# Patient Record
Sex: Female | Born: 1940 | Race: White | Hispanic: No | Smoking: Current every day smoker
Health system: Southern US, Community
[De-identification: ages and names within clinical notes are randomized; demographics above are authoritative.]

## PROBLEM LIST (undated history)

## (undated) ENCOUNTER — Emergency Department (HOSPITAL_COMMUNITY): Discharge status: Absent without leave | Payer: Medicare Other

## (undated) DIAGNOSIS — R51 Headache: Secondary | ICD-10-CM

## (undated) DIAGNOSIS — M549 Dorsalgia, unspecified: Secondary | ICD-10-CM

## (undated) DIAGNOSIS — K219 Gastro-esophageal reflux disease without esophagitis: Secondary | ICD-10-CM

## (undated) DIAGNOSIS — E119 Type 2 diabetes mellitus without complications: Secondary | ICD-10-CM

## (undated) DIAGNOSIS — R200 Anesthesia of skin: Secondary | ICD-10-CM

## (undated) DIAGNOSIS — R3915 Urgency of urination: Secondary | ICD-10-CM

## (undated) DIAGNOSIS — F419 Anxiety disorder, unspecified: Secondary | ICD-10-CM

## (undated) DIAGNOSIS — F329 Major depressive disorder, single episode, unspecified: Secondary | ICD-10-CM

## (undated) DIAGNOSIS — I10 Essential (primary) hypertension: Secondary | ICD-10-CM

## (undated) DIAGNOSIS — J45909 Unspecified asthma, uncomplicated: Secondary | ICD-10-CM

## (undated) DIAGNOSIS — Z8709 Personal history of other diseases of the respiratory system: Secondary | ICD-10-CM

## (undated) DIAGNOSIS — R609 Edema, unspecified: Secondary | ICD-10-CM

## (undated) DIAGNOSIS — Z8719 Personal history of other diseases of the digestive system: Secondary | ICD-10-CM

## (undated) DIAGNOSIS — Z8711 Personal history of peptic ulcer disease: Secondary | ICD-10-CM

## (undated) DIAGNOSIS — R600 Localized edema: Secondary | ICD-10-CM

## (undated) DIAGNOSIS — F32A Depression, unspecified: Secondary | ICD-10-CM

## (undated) DIAGNOSIS — K56609 Unspecified intestinal obstruction, unspecified as to partial versus complete obstruction: Secondary | ICD-10-CM

## (undated) DIAGNOSIS — J189 Pneumonia, unspecified organism: Secondary | ICD-10-CM

## (undated) DIAGNOSIS — Z87442 Personal history of urinary calculi: Secondary | ICD-10-CM

## (undated) DIAGNOSIS — R0602 Shortness of breath: Secondary | ICD-10-CM

## (undated) DIAGNOSIS — M2550 Pain in unspecified joint: Secondary | ICD-10-CM

## (undated) DIAGNOSIS — R350 Frequency of micturition: Secondary | ICD-10-CM

## (undated) DIAGNOSIS — G5600 Carpal tunnel syndrome, unspecified upper limb: Secondary | ICD-10-CM

## (undated) DIAGNOSIS — M1990 Unspecified osteoarthritis, unspecified site: Secondary | ICD-10-CM

## (undated) DIAGNOSIS — G8929 Other chronic pain: Secondary | ICD-10-CM

## (undated) DIAGNOSIS — J449 Chronic obstructive pulmonary disease, unspecified: Secondary | ICD-10-CM

## (undated) HISTORY — PX: ESOPHAGOGASTRODUODENOSCOPY: SHX1529

## (undated) HISTORY — PX: EPIDURAL BLOCK INJECTION: SHX1516

## (undated) HISTORY — PX: OTHER SURGICAL HISTORY: SHX169

## (undated) HISTORY — PX: CHOLECYSTECTOMY: SHX55

## (undated) HISTORY — PX: ABDOMINAL HYSTERECTOMY: SHX81

## (undated) HISTORY — PX: EYE SURGERY: SHX253

## (undated) HISTORY — PX: SHOULDER SURGERY: SHX246

## (undated) HISTORY — PX: SPINE SURGERY: SHX786

## (undated) HISTORY — DX: Unspecified asthma, uncomplicated: J45.909

## (undated) HISTORY — PX: JOINT REPLACEMENT: SHX530

---

## 1999-03-22 ENCOUNTER — Encounter: Payer: Self-pay | Admitting: Emergency Medicine

## 1999-03-22 ENCOUNTER — Emergency Department (HOSPITAL_COMMUNITY): Admission: EM | Admit: 1999-03-22 | Discharge: 1999-03-22 | Payer: Self-pay | Admitting: Emergency Medicine

## 1999-06-11 ENCOUNTER — Encounter: Payer: Self-pay | Admitting: Orthopedic Surgery

## 1999-06-15 ENCOUNTER — Inpatient Hospital Stay (HOSPITAL_COMMUNITY): Admission: RE | Admit: 1999-06-15 | Discharge: 1999-06-17 | Payer: Self-pay | Admitting: Orthopedic Surgery

## 1999-06-17 ENCOUNTER — Inpatient Hospital Stay (HOSPITAL_COMMUNITY)
Admission: RE | Admit: 1999-06-17 | Discharge: 1999-06-25 | Payer: Self-pay | Admitting: Physical Medicine & Rehabilitation

## 2000-05-03 ENCOUNTER — Ambulatory Visit (HOSPITAL_COMMUNITY): Admission: RE | Admit: 2000-05-03 | Discharge: 2000-05-03 | Payer: Self-pay | Admitting: Orthopedic Surgery

## 2000-05-03 ENCOUNTER — Encounter: Payer: Self-pay | Admitting: Orthopedic Surgery

## 2000-05-17 ENCOUNTER — Encounter: Payer: Self-pay | Admitting: Orthopedic Surgery

## 2000-05-17 ENCOUNTER — Ambulatory Visit (HOSPITAL_COMMUNITY): Admission: RE | Admit: 2000-05-17 | Discharge: 2000-05-17 | Payer: Self-pay | Admitting: Orthopedic Surgery

## 2000-12-07 ENCOUNTER — Encounter (HOSPITAL_COMMUNITY): Admission: RE | Admit: 2000-12-07 | Discharge: 2001-01-06 | Payer: Self-pay | Admitting: Sports Medicine

## 2000-12-10 ENCOUNTER — Encounter: Payer: Self-pay | Admitting: *Deleted

## 2000-12-10 ENCOUNTER — Emergency Department (HOSPITAL_COMMUNITY): Admission: EM | Admit: 2000-12-10 | Discharge: 2000-12-10 | Payer: Self-pay | Admitting: *Deleted

## 2000-12-24 ENCOUNTER — Encounter: Payer: Self-pay | Admitting: *Deleted

## 2000-12-24 ENCOUNTER — Emergency Department (HOSPITAL_COMMUNITY): Admission: EM | Admit: 2000-12-24 | Discharge: 2000-12-24 | Payer: Self-pay | Admitting: *Deleted

## 2001-01-19 ENCOUNTER — Encounter: Payer: Self-pay | Admitting: Sports Medicine

## 2001-01-19 ENCOUNTER — Encounter: Admission: RE | Admit: 2001-01-19 | Discharge: 2001-01-19 | Payer: Self-pay | Admitting: Sports Medicine

## 2001-02-02 ENCOUNTER — Encounter: Payer: Self-pay | Admitting: Sports Medicine

## 2001-02-02 ENCOUNTER — Encounter: Admission: RE | Admit: 2001-02-02 | Discharge: 2001-02-02 | Payer: Self-pay | Admitting: Sports Medicine

## 2001-09-27 ENCOUNTER — Encounter: Payer: Self-pay | Admitting: Sports Medicine

## 2001-09-27 ENCOUNTER — Encounter: Admission: RE | Admit: 2001-09-27 | Discharge: 2001-09-27 | Payer: Self-pay | Admitting: Sports Medicine

## 2003-03-05 ENCOUNTER — Ambulatory Visit (HOSPITAL_COMMUNITY): Admission: RE | Admit: 2003-03-05 | Discharge: 2003-03-05 | Payer: Self-pay | Admitting: Family Medicine

## 2003-03-05 ENCOUNTER — Encounter: Payer: Self-pay | Admitting: Family Medicine

## 2003-07-04 ENCOUNTER — Ambulatory Visit (HOSPITAL_COMMUNITY): Admission: RE | Admit: 2003-07-04 | Discharge: 2003-07-04 | Payer: Self-pay | Admitting: Family Medicine

## 2003-07-15 ENCOUNTER — Other Ambulatory Visit: Admission: RE | Admit: 2003-07-15 | Discharge: 2003-07-15 | Payer: Self-pay | Admitting: *Deleted

## 2003-07-22 ENCOUNTER — Ambulatory Visit (HOSPITAL_COMMUNITY): Admission: RE | Admit: 2003-07-22 | Discharge: 2003-07-22 | Payer: Self-pay | Admitting: Family Medicine

## 2003-08-09 ENCOUNTER — Ambulatory Visit (HOSPITAL_COMMUNITY): Admission: RE | Admit: 2003-08-09 | Discharge: 2003-08-09 | Payer: Self-pay | Admitting: Urology

## 2003-09-09 ENCOUNTER — Observation Stay (HOSPITAL_COMMUNITY): Admission: RE | Admit: 2003-09-09 | Discharge: 2003-09-10 | Payer: Self-pay | Admitting: General Surgery

## 2003-11-12 ENCOUNTER — Ambulatory Visit (HOSPITAL_COMMUNITY): Admission: RE | Admit: 2003-11-12 | Discharge: 2003-11-12 | Payer: Self-pay | Admitting: *Deleted

## 2004-01-29 ENCOUNTER — Encounter (INDEPENDENT_AMBULATORY_CARE_PROVIDER_SITE_OTHER): Payer: Self-pay | Admitting: Specialist

## 2004-01-29 ENCOUNTER — Inpatient Hospital Stay (HOSPITAL_COMMUNITY): Admission: RE | Admit: 2004-01-29 | Discharge: 2004-01-31 | Payer: Self-pay | Admitting: *Deleted

## 2004-01-29 ENCOUNTER — Encounter (INDEPENDENT_AMBULATORY_CARE_PROVIDER_SITE_OTHER): Payer: Self-pay | Admitting: *Deleted

## 2004-03-17 ENCOUNTER — Ambulatory Visit (HOSPITAL_COMMUNITY): Admission: RE | Admit: 2004-03-17 | Discharge: 2004-03-17 | Payer: Self-pay | Admitting: Ophthalmology

## 2004-08-13 ENCOUNTER — Ambulatory Visit (HOSPITAL_COMMUNITY): Admission: RE | Admit: 2004-08-13 | Discharge: 2004-08-13 | Payer: Self-pay | Admitting: Family Medicine

## 2004-12-10 ENCOUNTER — Ambulatory Visit: Payer: Self-pay | Admitting: Orthopedic Surgery

## 2005-01-06 ENCOUNTER — Ambulatory Visit: Admission: RE | Admit: 2005-01-06 | Discharge: 2005-01-06 | Payer: Self-pay | Admitting: Family Medicine

## 2005-03-09 ENCOUNTER — Ambulatory Visit (HOSPITAL_COMMUNITY): Admission: RE | Admit: 2005-03-09 | Discharge: 2005-03-09 | Payer: Self-pay | Admitting: Family Medicine

## 2005-05-04 ENCOUNTER — Emergency Department (HOSPITAL_COMMUNITY): Admission: EM | Admit: 2005-05-04 | Discharge: 2005-05-04 | Payer: Self-pay | Admitting: Emergency Medicine

## 2005-09-20 ENCOUNTER — Ambulatory Visit (HOSPITAL_COMMUNITY): Admission: RE | Admit: 2005-09-20 | Discharge: 2005-09-20 | Payer: Self-pay | Admitting: Family Medicine

## 2006-10-06 ENCOUNTER — Emergency Department (HOSPITAL_COMMUNITY): Admission: EM | Admit: 2006-10-06 | Discharge: 2006-10-06 | Payer: Self-pay | Admitting: Emergency Medicine

## 2007-01-31 ENCOUNTER — Ambulatory Visit (HOSPITAL_COMMUNITY): Admission: RE | Admit: 2007-01-31 | Discharge: 2007-01-31 | Payer: Self-pay | Admitting: Family Medicine

## 2007-04-27 ENCOUNTER — Encounter (HOSPITAL_COMMUNITY): Admission: RE | Admit: 2007-04-27 | Discharge: 2007-05-27 | Payer: Self-pay | Admitting: Unknown Physician Specialty

## 2007-10-03 ENCOUNTER — Ambulatory Visit (HOSPITAL_COMMUNITY): Admission: RE | Admit: 2007-10-03 | Discharge: 2007-10-03 | Payer: Self-pay | Admitting: Internal Medicine

## 2007-11-01 ENCOUNTER — Emergency Department (HOSPITAL_COMMUNITY): Admission: EM | Admit: 2007-11-01 | Discharge: 2007-11-01 | Payer: Self-pay | Admitting: Emergency Medicine

## 2007-11-17 ENCOUNTER — Ambulatory Visit (HOSPITAL_COMMUNITY): Admission: RE | Admit: 2007-11-17 | Discharge: 2007-11-17 | Payer: Self-pay | Admitting: Family Medicine

## 2007-12-05 ENCOUNTER — Ambulatory Visit (HOSPITAL_COMMUNITY): Admission: RE | Admit: 2007-12-05 | Discharge: 2007-12-05 | Payer: Self-pay | Admitting: Family Medicine

## 2008-01-08 ENCOUNTER — Ambulatory Visit (HOSPITAL_COMMUNITY): Admission: RE | Admit: 2008-01-08 | Discharge: 2008-01-08 | Payer: Self-pay | Admitting: Family Medicine

## 2008-01-08 ENCOUNTER — Encounter (INDEPENDENT_AMBULATORY_CARE_PROVIDER_SITE_OTHER): Payer: Self-pay | Admitting: Diagnostic Radiology

## 2008-01-19 ENCOUNTER — Ambulatory Visit: Payer: Self-pay | Admitting: Internal Medicine

## 2008-01-23 ENCOUNTER — Ambulatory Visit (HOSPITAL_COMMUNITY): Admission: RE | Admit: 2008-01-23 | Discharge: 2008-01-23 | Payer: Self-pay | Admitting: Internal Medicine

## 2008-01-29 ENCOUNTER — Ambulatory Visit (HOSPITAL_COMMUNITY): Admission: RE | Admit: 2008-01-29 | Discharge: 2008-01-29 | Payer: Self-pay | Admitting: Gastroenterology

## 2008-02-03 ENCOUNTER — Emergency Department (HOSPITAL_COMMUNITY): Admission: EM | Admit: 2008-02-03 | Discharge: 2008-02-03 | Payer: Self-pay | Admitting: Emergency Medicine

## 2008-02-08 ENCOUNTER — Ambulatory Visit: Payer: Self-pay | Admitting: Internal Medicine

## 2008-02-14 ENCOUNTER — Encounter: Admission: RE | Admit: 2008-02-14 | Discharge: 2008-02-14 | Payer: Self-pay | Admitting: Internal Medicine

## 2008-02-16 ENCOUNTER — Ambulatory Visit: Payer: Self-pay | Admitting: Internal Medicine

## 2008-02-16 DIAGNOSIS — I10 Essential (primary) hypertension: Secondary | ICD-10-CM | POA: Insufficient documentation

## 2008-02-16 DIAGNOSIS — F411 Generalized anxiety disorder: Secondary | ICD-10-CM

## 2008-02-16 DIAGNOSIS — J42 Unspecified chronic bronchitis: Secondary | ICD-10-CM

## 2008-02-16 DIAGNOSIS — F329 Major depressive disorder, single episode, unspecified: Secondary | ICD-10-CM

## 2008-02-16 DIAGNOSIS — Z8669 Personal history of other diseases of the nervous system and sense organs: Secondary | ICD-10-CM | POA: Insufficient documentation

## 2008-02-16 DIAGNOSIS — E119 Type 2 diabetes mellitus without complications: Secondary | ICD-10-CM | POA: Insufficient documentation

## 2008-02-16 DIAGNOSIS — M545 Low back pain: Secondary | ICD-10-CM

## 2008-02-16 DIAGNOSIS — N318 Other neuromuscular dysfunction of bladder: Secondary | ICD-10-CM

## 2008-02-16 DIAGNOSIS — M129 Arthropathy, unspecified: Secondary | ICD-10-CM | POA: Insufficient documentation

## 2008-02-16 DIAGNOSIS — K219 Gastro-esophageal reflux disease without esophagitis: Secondary | ICD-10-CM | POA: Insufficient documentation

## 2008-02-16 DIAGNOSIS — K279 Peptic ulcer, site unspecified, unspecified as acute or chronic, without hemorrhage or perforation: Secondary | ICD-10-CM | POA: Insufficient documentation

## 2008-02-20 ENCOUNTER — Telehealth (INDEPENDENT_AMBULATORY_CARE_PROVIDER_SITE_OTHER): Payer: Self-pay | Admitting: Internal Medicine

## 2008-02-21 ENCOUNTER — Encounter (INDEPENDENT_AMBULATORY_CARE_PROVIDER_SITE_OTHER): Payer: Self-pay | Admitting: Internal Medicine

## 2008-02-21 ENCOUNTER — Telehealth (INDEPENDENT_AMBULATORY_CARE_PROVIDER_SITE_OTHER): Payer: Self-pay | Admitting: *Deleted

## 2008-02-22 ENCOUNTER — Ambulatory Visit: Payer: Self-pay | Admitting: Internal Medicine

## 2008-02-22 DIAGNOSIS — R112 Nausea with vomiting, unspecified: Secondary | ICD-10-CM | POA: Insufficient documentation

## 2008-02-29 ENCOUNTER — Encounter (INDEPENDENT_AMBULATORY_CARE_PROVIDER_SITE_OTHER): Payer: Self-pay | Admitting: Internal Medicine

## 2008-05-01 ENCOUNTER — Encounter: Admission: RE | Admit: 2008-05-01 | Discharge: 2008-05-01 | Payer: Self-pay | Admitting: Sports Medicine

## 2008-06-28 ENCOUNTER — Ambulatory Visit (HOSPITAL_COMMUNITY): Admission: RE | Admit: 2008-06-28 | Discharge: 2008-06-29 | Payer: Self-pay | Admitting: Orthopedic Surgery

## 2008-08-08 ENCOUNTER — Encounter: Admission: RE | Admit: 2008-08-08 | Discharge: 2008-08-08 | Payer: Self-pay | Admitting: Orthopedic Surgery

## 2008-09-09 ENCOUNTER — Encounter (INDEPENDENT_AMBULATORY_CARE_PROVIDER_SITE_OTHER): Payer: Self-pay | Admitting: Internal Medicine

## 2008-09-25 ENCOUNTER — Encounter (HOSPITAL_COMMUNITY): Admission: RE | Admit: 2008-09-25 | Discharge: 2008-10-25 | Payer: Self-pay | Admitting: Orthopedic Surgery

## 2008-10-15 ENCOUNTER — Encounter (INDEPENDENT_AMBULATORY_CARE_PROVIDER_SITE_OTHER): Payer: Self-pay | Admitting: Internal Medicine

## 2008-10-29 ENCOUNTER — Encounter (HOSPITAL_COMMUNITY): Admission: RE | Admit: 2008-10-29 | Discharge: 2008-11-28 | Payer: Self-pay | Admitting: Orthopedic Surgery

## 2008-11-15 ENCOUNTER — Emergency Department (HOSPITAL_COMMUNITY): Admission: EM | Admit: 2008-11-15 | Discharge: 2008-11-15 | Payer: Self-pay | Admitting: Emergency Medicine

## 2009-06-26 ENCOUNTER — Encounter (HOSPITAL_COMMUNITY): Admission: RE | Admit: 2009-06-26 | Discharge: 2009-07-25 | Payer: Self-pay | Admitting: Orthopedic Surgery

## 2009-07-29 ENCOUNTER — Encounter (HOSPITAL_COMMUNITY): Admission: RE | Admit: 2009-07-29 | Discharge: 2009-08-28 | Payer: Self-pay | Admitting: Orthopedic Surgery

## 2009-08-21 ENCOUNTER — Encounter: Admission: RE | Admit: 2009-08-21 | Discharge: 2009-08-21 | Payer: Self-pay | Admitting: Sports Medicine

## 2009-09-23 ENCOUNTER — Ambulatory Visit (HOSPITAL_COMMUNITY): Admission: RE | Admit: 2009-09-23 | Discharge: 2009-09-24 | Payer: Self-pay | Admitting: Orthopedic Surgery

## 2009-10-14 ENCOUNTER — Inpatient Hospital Stay (HOSPITAL_COMMUNITY): Admission: AD | Admit: 2009-10-14 | Discharge: 2009-10-17 | Payer: Self-pay | Admitting: Orthopedic Surgery

## 2009-10-15 ENCOUNTER — Ambulatory Visit: Payer: Self-pay | Admitting: Internal Medicine

## 2009-10-24 ENCOUNTER — Emergency Department (HOSPITAL_COMMUNITY): Admission: EM | Admit: 2009-10-24 | Discharge: 2009-10-24 | Payer: Self-pay | Admitting: Emergency Medicine

## 2009-10-28 ENCOUNTER — Ambulatory Visit (HOSPITAL_COMMUNITY): Payer: Self-pay | Admitting: Internal Medicine

## 2009-10-28 ENCOUNTER — Encounter (HOSPITAL_COMMUNITY): Admission: RE | Admit: 2009-10-28 | Discharge: 2009-11-27 | Payer: Self-pay | Admitting: Internal Medicine

## 2009-11-07 ENCOUNTER — Emergency Department (HOSPITAL_COMMUNITY): Admission: EM | Admit: 2009-11-07 | Discharge: 2009-11-07 | Payer: Self-pay | Admitting: Emergency Medicine

## 2009-11-19 ENCOUNTER — Emergency Department (HOSPITAL_COMMUNITY): Admission: EM | Admit: 2009-11-19 | Discharge: 2009-11-19 | Payer: Self-pay | Admitting: Emergency Medicine

## 2010-02-16 ENCOUNTER — Emergency Department (HOSPITAL_COMMUNITY): Admission: EM | Admit: 2010-02-16 | Discharge: 2010-02-16 | Payer: Self-pay | Admitting: Emergency Medicine

## 2010-03-01 ENCOUNTER — Inpatient Hospital Stay (HOSPITAL_COMMUNITY): Admission: EM | Admit: 2010-03-01 | Discharge: 2010-03-09 | Payer: Self-pay | Admitting: Emergency Medicine

## 2010-03-09 ENCOUNTER — Inpatient Hospital Stay: Admission: AD | Admit: 2010-03-09 | Discharge: 2010-03-18 | Payer: Self-pay | Admitting: Internal Medicine

## 2010-04-22 ENCOUNTER — Emergency Department (HOSPITAL_COMMUNITY)
Admission: EM | Admit: 2010-04-22 | Discharge: 2010-04-23 | Payer: Self-pay | Source: Home / Self Care | Admitting: Emergency Medicine

## 2010-08-15 ENCOUNTER — Encounter: Payer: Self-pay | Admitting: Family Medicine

## 2010-08-16 ENCOUNTER — Encounter: Payer: Self-pay | Admitting: Internal Medicine

## 2010-08-16 ENCOUNTER — Encounter: Payer: Self-pay | Admitting: Sports Medicine

## 2010-08-16 ENCOUNTER — Encounter: Payer: Self-pay | Admitting: Orthopedic Surgery

## 2010-08-17 ENCOUNTER — Encounter: Payer: Self-pay | Admitting: Internal Medicine

## 2010-10-08 LAB — DIFFERENTIAL
Basophils Relative: 1 % (ref 0–1)
Eosinophils Absolute: 0.4 10*3/uL (ref 0.0–0.7)
Lymphs Abs: 2.6 10*3/uL (ref 0.7–4.0)
Monocytes Absolute: 0.5 10*3/uL (ref 0.1–1.0)
Monocytes Relative: 6 % (ref 3–12)

## 2010-10-08 LAB — BASIC METABOLIC PANEL
CO2: 23 mEq/L (ref 19–32)
CO2: 27 mEq/L (ref 19–32)
Calcium: 9.7 mg/dL (ref 8.4–10.5)
Chloride: 101 mEq/L (ref 96–112)
GFR calc Af Amer: >60 mL/min (ref >60)
GFR calc non Af Amer: >60 mL/min (ref >60)
Glucose, Bld: 101 mg/dL — ABNORMAL HIGH (ref 70–99)
Glucose, Bld: 100 mg/dL — ABNORMAL HIGH (ref 70–99)
Potassium: 2.8 mEq/L — ABNORMAL LOW (ref 3.5–5.1)
Potassium: 3.9 mEq/L (ref 3.5–5.1)
Sodium: 133 mEq/L — ABNORMAL LOW (ref 135–145)
Sodium: 134 mEq/L — ABNORMAL LOW (ref 135–145)

## 2010-10-08 LAB — CBC
HCT: 34.3 % — ABNORMAL LOW (ref 36.0–46.0)
Hemoglobin: 11.7 g/dL — ABNORMAL LOW (ref 12.0–15.0)
MCH: 28.7 pg (ref 26.0–34.0)
MCHC: 34.2 g/dL (ref 30.0–36.0)
RBC: 4.08 MIL/uL (ref 3.87–5.11)

## 2010-10-08 LAB — POCT CARDIAC MARKERS: CKMB, poc: <1 ng/mL — ABNORMAL LOW (ref 1.0–8.0)

## 2010-10-09 LAB — PROTIME-INR
INR: 1.01 (ref 0.00–1.49)
Prothrombin Time: 13.5 seconds (ref 11.6–15.2)

## 2010-10-09 LAB — DIFFERENTIAL
Basophils Absolute: 0 10*3/uL (ref 0.0–0.1)
Basophils Absolute: 0 10*3/uL (ref 0.0–0.1)
Basophils Relative: 1 % (ref 0–1)
Basophils Relative: 1 % (ref 0–1)
Eosinophils Absolute: 0.2 10*3/uL (ref 0.0–0.7)
Eosinophils Absolute: 0.2 10*3/uL (ref 0.0–0.7)
Eosinophils Absolute: 1 10*3/uL — ABNORMAL HIGH (ref 0.0–0.7)
Eosinophils Relative: 2 % (ref 0–5)
Eosinophils Relative: 7 % — ABNORMAL HIGH (ref 0–5)
Lymphocytes Relative: 11 % — ABNORMAL LOW (ref 12–46)
Lymphs Abs: 1.2 10*3/uL (ref 0.7–4.0)
Lymphs Abs: 1.2 10*3/uL (ref 0.7–4.0)
Lymphs Abs: 1.5 10*3/uL (ref 0.7–4.0)
Lymphs Abs: 1.7 10*3/uL (ref 0.7–4.0)
Monocytes Absolute: 0.5 10*3/uL (ref 0.1–1.0)
Monocytes Absolute: 0.6 10*3/uL (ref 0.1–1.0)
Monocytes Absolute: 0.7 10*3/uL (ref 0.1–1.0)
Monocytes Relative: 5 % (ref 3–12)
Monocytes Relative: 6 % (ref 3–12)
Monocytes Relative: 7 % (ref 3–12)
Monocytes Relative: 9 % (ref 3–12)
Neutro Abs: 5.5 10*3/uL (ref 1.7–7.7)
Neutro Abs: 5.9 10*3/uL (ref 1.7–7.7)
Neutrophils Relative %: 62 % (ref 43–77)
Neutrophils Relative %: 69 % (ref 43–77)
Neutrophils Relative %: 72 % (ref 43–77)
Neutrophils Relative %: 81 % — ABNORMAL HIGH (ref 43–77)

## 2010-10-09 LAB — URINALYSIS, ROUTINE W REFLEX MICROSCOPIC
Bilirubin Urine: NEGATIVE
Hgb urine dipstick: NEGATIVE
Ketones, ur: NEGATIVE mg/dL
Nitrite: NEGATIVE
Protein, ur: NEGATIVE mg/dL
Specific Gravity, Urine: 1.025 (ref 1.005–1.030)
Urobilinogen, UA: 0.2 mg/dL (ref 0.0–1.0)

## 2010-10-09 LAB — BLOOD GAS, ARTERIAL
Acid-base deficit: 0.2 mmol/L (ref 0.0–2.0)
Acid-base deficit: 1 mmol/L (ref 0.0–2.0)
Acid-base deficit: 1.8 mmol/L (ref 0.0–2.0)
Bicarbonate: 23.2 mEq/L (ref 20.0–24.0)
Bicarbonate: 24.1 mEq/L — ABNORMAL HIGH (ref 20.0–24.0)
Bicarbonate: 25 mEq/L — ABNORMAL HIGH (ref 20.0–24.0)
Bicarbonate: 28.5 mEq/L — ABNORMAL HIGH (ref 20.0–24.0)
FIO2: 1 %
FIO2: 40 %
FIO2: 40 %
MECHVT: 500 mL
O2 Saturation: 96.7 %
PEEP: 5 cmH2O
Patient temperature: 37
Patient temperature: 37
RATE: 14 resp/min
RATE: 14 resp/min
TCO2: 22.6 mmol/L (ref 0–100)
TCO2: 23.8 mmol/L (ref 0–100)
TCO2: 26.6 mmol/L (ref 0–100)
pCO2 arterial: 31.9 mmHg — ABNORMAL LOW (ref 35.0–45.0)
pCO2 arterial: 41 mmHg (ref 35.0–45.0)
pCO2 arterial: 43.4 mmHg (ref 35.0–45.0)
pH, Arterial: 7.379 (ref 7.350–7.400)
pH, Arterial: 7.456 — ABNORMAL HIGH (ref 7.350–7.400)
pH, Arterial: 7.459 — ABNORMAL HIGH (ref 7.350–7.400)
pO2, Arterial: 152 mmHg — ABNORMAL HIGH (ref 80.0–100.0)
pO2, Arterial: 200 mmHg — ABNORMAL HIGH (ref 80.0–100.0)
pO2, Arterial: 63.4 mmHg — ABNORMAL LOW (ref 80.0–100.0)
pO2, Arterial: 77.9 mmHg — ABNORMAL LOW (ref 80.0–100.0)

## 2010-10-09 LAB — URINE CULTURE
Colony Count: >=100000
Culture  Setup Time: 201108071945

## 2010-10-09 LAB — BASIC METABOLIC PANEL
BUN: 3 mg/dL — ABNORMAL LOW (ref 6–23)
BUN: <1 mg/dL — ABNORMAL LOW (ref 6–23)
CO2: 24 mEq/L (ref 19–32)
CO2: 27 mEq/L (ref 19–32)
Calcium: 9.1 mg/dL (ref 8.4–10.5)
Chloride: 100 mEq/L (ref 96–112)
Chloride: 100 mEq/L (ref 96–112)
Chloride: 101 mEq/L (ref 96–112)
Chloride: 95 mEq/L — ABNORMAL LOW (ref 96–112)
Chloride: 99 mEq/L (ref 96–112)
Creatinine, Ser: 0.41 mg/dL (ref 0.4–1.2)
Creatinine, Ser: 0.44 mg/dL (ref 0.4–1.2)
GFR calc Af Amer: >60 mL/min (ref >60)
GFR calc Af Amer: >60 mL/min (ref >60)
GFR calc Af Amer: >60 mL/min (ref >60)
GFR calc Af Amer: >60 mL/min (ref >60)
GFR calc non Af Amer: >60 mL/min (ref >60)
Glucose, Bld: 102 mg/dL — ABNORMAL HIGH (ref 70–99)
Potassium: 2.5 mEq/L — CL (ref 3.5–5.1)
Potassium: 3.2 mEq/L — ABNORMAL LOW (ref 3.5–5.1)
Potassium: 3.6 mEq/L (ref 3.5–5.1)
Potassium: 3.7 mEq/L (ref 3.5–5.1)
Sodium: 130 mEq/L — ABNORMAL LOW (ref 135–145)
Sodium: 132 mEq/L — ABNORMAL LOW (ref 135–145)
Sodium: 133 mEq/L — ABNORMAL LOW (ref 135–145)
Sodium: 133 mEq/L — ABNORMAL LOW (ref 135–145)

## 2010-10-09 LAB — POCT CARDIAC MARKERS
CKMB, poc: 1.4 ng/mL (ref 1.0–8.0)
Troponin i, poc: <0.05 ng/mL (ref 0.00–0.09)

## 2010-10-09 LAB — TSH: TSH: 1.428 u[IU]/mL (ref 0.350–4.500)

## 2010-10-09 LAB — CBC
HCT: 26.3 % — ABNORMAL LOW (ref 36.0–46.0)
HCT: 27.6 % — ABNORMAL LOW (ref 36.0–46.0)
HCT: 28 % — ABNORMAL LOW (ref 36.0–46.0)
HCT: 28.6 % — ABNORMAL LOW (ref 36.0–46.0)
Hemoglobin: 9.3 g/dL — ABNORMAL LOW (ref 12.0–15.0)
Hemoglobin: 9.4 g/dL — ABNORMAL LOW (ref 12.0–15.0)
Hemoglobin: 9.7 g/dL — ABNORMAL LOW (ref 12.0–15.0)
MCH: 28.4 pg (ref 26.0–34.0)
MCH: 29.2 pg (ref 26.0–34.0)
MCHC: 33.6 g/dL (ref 30.0–36.0)
MCHC: 33.6 g/dL (ref 30.0–36.0)
MCV: 84.4 fL (ref 78.0–100.0)
MCV: 84.9 fL (ref 78.0–100.0)
MCV: 85.1 fL (ref 78.0–100.0)
MCV: 85.7 fL (ref 78.0–100.0)
Platelets: 218 10*3/uL (ref 150–400)
Platelets: 266 10*3/uL (ref 150–400)
Platelets: 280 10*3/uL (ref 150–400)
RBC: 3.1 MIL/uL — ABNORMAL LOW (ref 3.87–5.11)
RBC: 3.18 MIL/uL — ABNORMAL LOW (ref 3.87–5.11)
RBC: 3.32 MIL/uL — ABNORMAL LOW (ref 3.87–5.11)
RDW: 14.9 % (ref 11.5–15.5)
RDW: 15.4 % (ref 11.5–15.5)
RDW: 15.5 % (ref 11.5–15.5)
WBC: 11 10*3/uL — ABNORMAL HIGH (ref 4.0–10.5)
WBC: 8.6 10*3/uL (ref 4.0–10.5)
WBC: 9.9 10*3/uL (ref 4.0–10.5)
WBC: 9.9 10*3/uL (ref 4.0–10.5)

## 2010-10-09 LAB — BRAIN NATRIURETIC PEPTIDE: Pro B Natriuretic peptide (BNP): 125 pg/mL — ABNORMAL HIGH (ref 0.0–100.0)

## 2010-10-09 LAB — CARDIAC PANEL(CRET KIN+CKTOT+MB+TROPI)
CK, MB: 1.8 ng/mL (ref 0.3–4.0)
CK, MB: 2.3 ng/mL (ref 0.3–4.0)
Relative Index: 1 (ref 0.0–2.5)
Total CK: 169 U/L (ref 7–177)
Total CK: 190 U/L — ABNORMAL HIGH (ref 7–177)
Troponin I: 0.02 ng/mL (ref 0.00–0.06)
Troponin I: 0.02 ng/mL (ref 0.00–0.06)

## 2010-10-09 LAB — COMPREHENSIVE METABOLIC PANEL
ALT: 42 U/L — ABNORMAL HIGH (ref 0–35)
Albumin: 3.7 g/dL (ref 3.5–5.2)
Alkaline Phosphatase: 115 U/L (ref 39–117)
Calcium: 9.9 mg/dL (ref 8.4–10.5)
GFR calc Af Amer: >60 mL/min (ref >60)
Glucose, Bld: 133 mg/dL — ABNORMAL HIGH (ref 70–99)
Potassium: 4.8 mEq/L (ref 3.5–5.1)
Sodium: 126 mEq/L — ABNORMAL LOW (ref 135–145)
Total Protein: 6.8 g/dL (ref 6.0–8.3)

## 2010-10-09 LAB — URINE MICROSCOPIC-ADD ON

## 2010-10-09 LAB — RETICULOCYTES: Retic Ct Pct: 2.3 % (ref 0.4–3.1)

## 2010-10-09 LAB — CULTURE, BLOOD (ROUTINE X 2)
Culture: NO GROWTH
Culture: NO GROWTH
Report Status: 8122011
Report Status: 8122011

## 2010-10-09 LAB — VITAMIN B12: Vitamin B-12: >2000 pg/mL — ABNORMAL HIGH (ref 211–911)

## 2010-10-09 LAB — RAPID URINE DRUG SCREEN, HOSP PERFORMED
Barbiturates: NOT DETECTED
Cocaine: NOT DETECTED

## 2010-10-09 LAB — VANCOMYCIN, TROUGH: Vancomycin Tr: 6.9 ug/mL — ABNORMAL LOW (ref 10.0–20.0)

## 2010-10-09 LAB — SAMPLE TO BLOOD BANK

## 2010-10-09 LAB — MRSA PCR SCREENING: MRSA by PCR: NEGATIVE

## 2010-10-09 LAB — IRON AND TIBC
Iron: 21 ug/dL — ABNORMAL LOW (ref 42–135)
Saturation Ratios: 8 % — ABNORMAL LOW (ref 20–55)
TIBC: 252 ug/dL (ref 250–470)

## 2010-10-10 LAB — BASIC METABOLIC PANEL
CO2: 25 mEq/L (ref 19–32)
Chloride: 99 mEq/L (ref 96–112)
GFR calc Af Amer: >60 mL/min (ref >60)
Potassium: 4.1 mEq/L (ref 3.5–5.1)
Sodium: 130 mEq/L — ABNORMAL LOW (ref 135–145)

## 2010-10-10 LAB — CBC
Hemoglobin: 11.8 g/dL — ABNORMAL LOW (ref 12.0–15.0)
MCH: 28.6 pg (ref 26.0–34.0)
MCV: 84.7 fL (ref 78.0–100.0)
RBC: 4.13 MIL/uL (ref 3.87–5.11)

## 2010-10-10 LAB — POCT CARDIAC MARKERS
CKMB, poc: <1 ng/mL — ABNORMAL LOW (ref 1.0–8.0)
CKMB, poc: <1 ng/mL — ABNORMAL LOW (ref 1.0–8.0)
Myoglobin, poc: 69.1 ng/mL (ref 12–200)
Myoglobin, poc: 78.8 ng/mL (ref 12–200)
Troponin i, poc: <0.05 ng/mL (ref 0.00–0.09)

## 2010-10-13 LAB — DIFFERENTIAL
Eosinophils Relative: 9 % — ABNORMAL HIGH (ref 0–5)
Lymphocytes Relative: 15 % (ref 12–46)
Lymphs Abs: 1.3 10*3/uL (ref 0.7–4.0)
Monocytes Absolute: 0.4 10*3/uL (ref 0.1–1.0)
Monocytes Relative: 5 % (ref 3–12)
Neutro Abs: 6.2 10*3/uL (ref 1.7–7.7)

## 2010-10-13 LAB — POCT CARDIAC MARKERS
CKMB, poc: <1 ng/mL — ABNORMAL LOW (ref 1.0–8.0)
Myoglobin, poc: 65.5 ng/mL (ref 12–200)
Myoglobin, poc: 67.9 ng/mL (ref 12–200)
Troponin i, poc: <0.05 ng/mL (ref 0.00–0.09)

## 2010-10-13 LAB — BASIC METABOLIC PANEL
GFR calc Af Amer: >60 mL/min (ref >60)
GFR calc non Af Amer: >60 mL/min (ref >60)
Glucose, Bld: 117 mg/dL — ABNORMAL HIGH (ref 70–99)
Potassium: 3.3 mEq/L — ABNORMAL LOW (ref 3.5–5.1)
Sodium: 133 mEq/L — ABNORMAL LOW (ref 135–145)

## 2010-10-13 LAB — BLOOD GAS, ARTERIAL
Acid-Base Excess: 6.1 mmol/L — ABNORMAL HIGH (ref 0.0–2.0)
Bicarbonate: 30.3 mEq/L — ABNORMAL HIGH (ref 20.0–24.0)
O2 Saturation: 99.2 %
pCO2 arterial: 46 mmHg — ABNORMAL HIGH (ref 35.0–45.0)
pO2, Arterial: 156 mmHg — ABNORMAL HIGH (ref 80.0–100.0)

## 2010-10-13 LAB — CBC
HCT: 30.2 % — ABNORMAL LOW (ref 36.0–46.0)
Hemoglobin: 10.4 g/dL — ABNORMAL LOW (ref 12.0–15.0)
RBC: 3.57 MIL/uL — ABNORMAL LOW (ref 3.87–5.11)
RDW: 14.6 % (ref 11.5–15.5)

## 2010-10-14 LAB — CBC
HCT: 37.6 % (ref 36.0–46.0)
MCV: 90.1 fL (ref 78.0–100.0)
Platelets: 379 10*3/uL (ref 150–400)
RDW: 15.4 % (ref 11.5–15.5)
WBC: 7.9 10*3/uL (ref 4.0–10.5)

## 2010-10-14 LAB — BASIC METABOLIC PANEL
BUN: 6 mg/dL (ref 6–23)
Chloride: 97 mEq/L (ref 96–112)
Creatinine, Ser: 0.6 mg/dL (ref 0.4–1.2)
GFR calc non Af Amer: >60 mL/min (ref >60)
Glucose, Bld: 126 mg/dL — ABNORMAL HIGH (ref 70–99)
Potassium: 4.7 mEq/L (ref 3.5–5.1)

## 2010-10-14 LAB — DIFFERENTIAL
Basophils Absolute: 0 10*3/uL (ref 0.0–0.1)
Eosinophils Absolute: 0.7 10*3/uL (ref 0.0–0.7)
Eosinophils Relative: 9 % — ABNORMAL HIGH (ref 0–5)
Lymphs Abs: 1.3 10*3/uL (ref 0.7–4.0)
Neutrophils Relative %: 68 % (ref 43–77)

## 2010-10-19 LAB — BASIC METABOLIC PANEL
BUN: 3 mg/dL — ABNORMAL LOW (ref 6–23)
CO2: 25 mEq/L (ref 19–32)
CO2: 27 mEq/L (ref 19–32)
GFR calc non Af Amer: >60 mL/min (ref >60)
GFR calc non Af Amer: >60 mL/min (ref >60)
Glucose, Bld: 107 mg/dL — ABNORMAL HIGH (ref 70–99)
Glucose, Bld: 112 mg/dL — ABNORMAL HIGH (ref 70–99)
Potassium: 3.4 mEq/L — ABNORMAL LOW (ref 3.5–5.1)
Potassium: 4 mEq/L (ref 3.5–5.1)
Sodium: 133 mEq/L — ABNORMAL LOW (ref 135–145)

## 2010-10-19 LAB — CULTURE, ROUTINE-ABSCESS
Culture: NO GROWTH
Gram Stain: NONE SEEN

## 2010-10-19 LAB — BODY FLUID CULTURE: Culture: NO GROWTH

## 2010-10-19 LAB — PROTIME-INR: INR: 1.11 (ref 0.00–1.49)

## 2010-10-19 LAB — URINALYSIS, ROUTINE W REFLEX MICROSCOPIC
Ketones, ur: NEGATIVE mg/dL
Nitrite: NEGATIVE
Specific Gravity, Urine: 1.012 (ref 1.005–1.030)
pH: 6 (ref 5.0–8.0)

## 2010-10-19 LAB — COMPREHENSIVE METABOLIC PANEL
ALT: 58 U/L — ABNORMAL HIGH (ref 0–35)
AST: 31 U/L (ref 0–37)
Alkaline Phosphatase: 140 U/L — ABNORMAL HIGH (ref 39–117)
CO2: 24 mEq/L (ref 19–32)
Calcium: 9.3 mg/dL (ref 8.4–10.5)
Chloride: 98 mEq/L (ref 96–112)
GFR calc Af Amer: >60 mL/min (ref >60)
GFR calc non Af Amer: >60 mL/min (ref >60)
Glucose, Bld: 111 mg/dL — ABNORMAL HIGH (ref 70–99)
Potassium: 4.6 mEq/L (ref 3.5–5.1)
Sodium: 129 mEq/L — ABNORMAL LOW (ref 135–145)

## 2010-10-19 LAB — GLUCOSE, CAPILLARY
Glucose-Capillary: 101 mg/dL — ABNORMAL HIGH (ref 70–99)
Glucose-Capillary: 116 mg/dL — ABNORMAL HIGH (ref 70–99)
Glucose-Capillary: 123 mg/dL — ABNORMAL HIGH (ref 70–99)
Glucose-Capillary: 124 mg/dL — ABNORMAL HIGH (ref 70–99)
Glucose-Capillary: 131 mg/dL — ABNORMAL HIGH (ref 70–99)

## 2010-10-19 LAB — SYNOVIAL CELL COUNT + DIFF, W/ CRYSTALS
Crystals, Fluid: NONE SEEN
Lymphocytes-Synovial Fld: 13 % (ref 0–20)
Neutrophil, Synovial: 65 % — ABNORMAL HIGH (ref 0–25)

## 2010-10-19 LAB — CBC
HCT: 34.2 % — ABNORMAL LOW (ref 36.0–46.0)
HCT: 35.2 % — ABNORMAL LOW (ref 36.0–46.0)
Hemoglobin: 11.4 g/dL — ABNORMAL LOW (ref 12.0–15.0)
Hemoglobin: 11.5 g/dL — ABNORMAL LOW (ref 12.0–15.0)
MCHC: 33.1 g/dL (ref 30.0–36.0)
MCHC: 33.9 g/dL (ref 30.0–36.0)
MCV: 89.6 fL (ref 78.0–100.0)
Platelets: 335 10*3/uL (ref 150–400)
RBC: 3.81 MIL/uL — ABNORMAL LOW (ref 3.87–5.11)
RDW: 14.7 % (ref 11.5–15.5)
RDW: 14.8 % (ref 11.5–15.5)
WBC: 8.6 10*3/uL (ref 4.0–10.5)

## 2010-10-19 LAB — DIFFERENTIAL
Basophils Absolute: 0.1 10*3/uL (ref 0.0–0.1)
Eosinophils Absolute: 0.8 10*3/uL — ABNORMAL HIGH (ref 0.0–0.7)
Eosinophils Relative: 9 % — ABNORMAL HIGH (ref 0–5)
Lymphocytes Relative: 19 % (ref 12–46)
Monocytes Absolute: 0.7 10*3/uL (ref 0.1–1.0)

## 2010-10-19 LAB — CULTURE, BLOOD (ROUTINE X 2): Culture: NO GROWTH

## 2010-10-19 LAB — URINE MICROSCOPIC-ADD ON

## 2010-10-19 LAB — URINE CULTURE

## 2010-10-19 LAB — SEDIMENTATION RATE: Sed Rate: 28 mm/hr — ABNORMAL HIGH (ref 0–22)

## 2010-12-08 NOTE — Assessment & Plan Note (Signed)
Veronica Key, Veronica Key                   CHART#:  [id number]   DATE:  02/08/2008                       DOB:  [birth year]   FOLLOWUP:  Lower abdominal discomfort, rectal pain seen on 01/18/1978  after a long absence from the practice.  She was seen by Lorenza Burton  here on 01/20/2008.  She has felt to have a fullness suspicious for  possible hernia in lower abdomen proceed.  We proceeded with a CT scan  which did not demonstrate any evidence of hernia, but demonstrated a  suspicious right adrenal mass.  MRI was suggested.  There is some mild  biliary dilation as well.  She could not tolerate MRI because of  claustrophobia.  Transabdominal transvaginal ultrasound because of left  ovarian cyst was done on 01/29/2008 which demonstrated simple left  ovarian cyst, status post hysterectomy and right nephrectomy.  She is  status post cholecystectomy.  CBC looked okay except for slightly  elevated eosinophil count of 8.  LFTs were normal.  She was having some  rectal pain and told me her hemorrhoids have gone back and the family  history of colon cancer.  She had a colonoscopy through this office over  10 years ago, but our records have been purged.   CURRENT MEDICATIONS:  See updated list.   ALLERGIES:  Keflex.   She tells me that she has been discharged from the [facility] due to a conflict between her son and the [facility] folks.  She  is going to be seeing Dr. Sudie Bailey in the near future.   PHYSICAL EXAMINATION:  GENERAL:  A frail 70 year old lady appears  comfortable, ambulates with a walker.  VITAL SIGNS:  Weight 188 pounds, up 6 pounds from the way in last month,  temperature 97.7, BP 160/80, pulse 80.  SKIN:  Warm and dry.  No jaundice.  CHEST:  Lungs are clear to auscultation.  CARDIAC:  Regular rate and rhythm without murmur, gallop, or rub.  ABDOMEN:  Nondistended.  She has surgical scars well healed.  Positive  bowel sounds.  Soft.  Entirely nontender.  I do not  appreciate a mass or  evidence of hernias.  Today, no organomegaly.  EXTREMITIES:  No edema.  RECTAL:  Small external hemorrhoidal tag, otherwise no masses.  Brown  stool in the rectal vault.  Hemoccult negative.   ASSESSMENT:  The patient is an elderly lady with multiple medical  problems who has some nonspecific abdominal complaints.  CT demonstrated  questionable suspicion for right adrenal mass.  CTs of both kidneys were  normal.  On the ultrasound, it was noted she was status post right  nephrectomy.  Her rectal complaints have resolved.  She probably needs  a colonoscopy for at least screening along her research hospital record  to see when her last colonoscopy was done.  We will go ahead and I have  recommended she go down to [facility] and have an MRI of her abdomen in  open scanner.  We will facilitate this.  Further recommendations to  follow in the near future.       Jonathon Bellows, M.D.  Electronically Signed     RMR/MEDQ  D:  02/08/2008  T:  02/09/2008  Job:  [id number]   cc:   Madelin Rear. Sherwood Gambler, MD

## 2010-12-08 NOTE — Op Note (Signed)
NAMECHARNAE, Veronica Key                  ACCOUNT NO.:  [id number]   MEDICAL RECORD NO.:  [id number]          PATIENT TYPE:  OIB   LOCATION:  5039                         FACILITY:  [facility]   PHYSICIAN:  Eulas Post, MD    DATE OF BIRTH:  August 27, 1940   DATE OF PROCEDURE:  06/28/2008  DATE OF DISCHARGE:                               OPERATIVE REPORT   PREOPERATIVE DIAGNOSES:  1. Left shoulder rotator cuff tear.  2. Impingement.  3. Acromioclavicular joint arthritis.  4. Biceps tendinosis.   POSTOPERATIVE DIAGNOSES:  1. Left shoulder rotator cuff tear.  2. Impingement.  3. Acromioclavicular joint arthritis.  4. Biceps tendinosis.   OPERATIVE PROCEDURE:  Left shoulder arthroscopy with rotator cuff repair  and acromioplasty with distal clavicle excision and biceps tenolysis  with extensive debridement.   PREOPERATIVE INDICATIONS:  [name] is a 70 year old woman who  complained of ongoing chronic left shoulder pain.  She had difficulty  with overhead motion.  She elected to undergo the above-named  procedures.  The risks, benefits, and alternatives were discussed with  her preoperatively including, but not limited to risks of infection,  bleeding, nerve injury, recurrent rotator cuff tear, shoulder stiffness,  adhesive capsulitis, recurrent shoulder dysfunction, cardiopulmonary  complications, among others and she was willing to proceed.   OPERATIVE FINDINGS:  There was a tear of the supraspinatus.  This tear  was a crescent type tear that had basically uncovered the entirety of  the rotator cuff footprint.  The rotator cuff was delaminated and had a  portion of it that was retracted by about 2 cm.  The findings were  consistent with the MRI findings.  Her biceps tendon had significant  degenerative disease both in the tendon as well as in the labrum itself.  The infraspinatus was intact and the subscapularis was intact.  There  was minimal glenohumeral arthritis.   OPERATIVE  PROCEDURE:  The patient was brought to the operating room and  placed in supine position.  General anesthesia was administered.  She  was turned into the sloppy lateral decubitus position.  Her skin was  extremely frail, and appeared to almost rip even just by simple touch.  Therefore, we were extremely gentle with her skin of her upper extremity  during the draping and throughout the procedure.  We wrapped the arm in  Webril prior to the application of the shoulder suspension apparatus.  We took extra care to prevent any damage to her delicate tissue.  She  had a number of areas of bruising and rashes that were there  preoperatively all along the left thorax and arm.   After a delicate prep of her left shoulder, we did most of the case  without having any traction on that all.  We used a combination of  between 5 pounds and as much as 10 pounds at various portions of the  case for visualization, but the vast majority of the case was done  without any traction and with the arm simply suspended.   After prep and drape, we performed diagnostic arthroscopy with  the above-  named findings.  We performed the biceps tenolysis as I have discussed  with her preoperatively.  We then went to the subacromial space.  There  was an anterior portion of the acromion that was not attached, and as if  it were an unfused os acromiale, and we excised the small piece.  We  then debrided the poor tissue of the rotator cuff and placed a total of  2 medial row anchors using 5.5 mm peek anchors.  We then passed stitches  in horizontal mattress-type fashion through the supraspinatus.  We  abraded the greater tuberosity in order to achieve a better bleeding  bed.  Once we had secured the rotator cuff with the 2 suture anchors, we  had excellent reapposition of the tendon to bone.  We then placed a  total of 2 push locks laterally using a suture-bridge type technique.  This provided excellent reapposition of the  tissue to the greater  tuberosity.   We then turned our attention to the acromion and a acromioplasty was  performed with a bur.  Smooth surface was achieved.  We then turned our  attention to the distal clavicle.  The distal clavicle was resected with  adequate resection, both anterior and posteriorly as well as removing  the osteophytes off of the acromial side.  Confirmation was made from  multiple views.  The shoulder was irrigated and then injected.  The  portals were closed with Monocryl.  We did not use Steri-Strips due to  her sensitive skin and her TAPE allergy.  We placed sterile gauze  followed by a sling.  She was turned supine and a Foley was placed.  She  was returned to the PACU in stable and satisfactory condition.  There  were no complications.  The patient tolerated the procedure well.      Eulas Post, MD  Electronically Signed     JPL/MEDQ  D:  06/28/2008  T:  06/29/2008  Job:  [id number]

## 2010-12-08 NOTE — Assessment & Plan Note (Signed)
NAMESHERRISE, LIBERTO                   CHART#:  [id number]   DATE:  01/19/2008                       DOB:  November 21, 1940   CHIEF COMPLAIN:  I think I have a hernia.   HISTORY OF PRESENT ILLNESS:  Veronica Key is a 70 year old Caucasian  female.  She has noticed redness and warmth to her lower abdomen with a  pressure sensation from the umbilicus down for about 5 months now.  At  times, it feels quite hard.  She tells me the pain is a constant pain.  She denies any actual pain.  She tells me she does have a twinge of pain  when she bends over.  She denies any fever.  She has had no history of  chills and tells me, I stay cold.  She denies any nausea or vomiting.  She is having 1-2 soft brown bowel movements a day.  Rarely, she becomes  constipated and has to take Correctol.  She denies any rectal bleeding  or melena.  She does have history of chronic acid reflux.  She is on  omeprazole, which does work very well.  She denies dysphagia,  odynophagia, anorexia, or early satiety.   PAST MEDICAL AND SURGICAL HISTORY:  Diabetes mellitus and hypertension.  She tells me she has chronic GERD and had an EGD years ago.  I  do not  know the results.  She had a colonoscopy many years ago, she believes  this was more than 10, but does not remember the results.  Arthritis and  bronchitis.  She is status post cholecystectomy, hysterectomy, bilateral  cataracts, and bilateral knee replacement.   CURRENT MEDICATIONS:  TAC cream p.r.n., ProAir inhaler p.r.n.,  omeprazole 20 mg daily, oxycodone p.r.n., diazepam 5 mg b.i.d.,  amlodipine unknown dose daily, and nystatin cream p.r.n., diclofenac 50  mg b.i.d., meclizine 25 mg daily, Premarin 1.25 mg daily, zolpidem 10 mg  daily, and amitriptyline/perphenazine 25 mg daily.   ALLERGIES:  KEFLEX.   FAMILY HISTORY:  There is a family history of her father having gastric  cancer at age 13.  Mother has hypertension.  She has 3 healthy siblings.   SOCIAL HISTORY:   Veronica Key is a widow.  She has 3 healthy children.  She  did lose 1 child in a motor vehicle accident.  She is a retired  Advertising copywriter.  She has a 40+ pack-a-year history of tobacco use.  Denies  any alcohol or drug use.   REVIEW OF SYSTEMS:  See HPI, otherwise negative.   PHYSICAL EXAMINATION:  VITAL SIGNS:  Weight 182 pounds, height 61  inches, temperature 98 degrees, blood pressure 190/90, and pulse 100.  GENERAL:  Veronica Key is an elderly Caucasian female who is alert,  oriented, pleasant, cooperative, in no acute distress.  She is  accompanied by her home health nurse today.  HEENT:  Sclera clear, nonicteric.  Conjunctivae pink.  Oropharynx pink  and moist without lesions.  NECK:  Supple without masses or thyromegaly.  CHEST:  Heart rate and rhythm.  Normal S1 and S2 without murmurs,  clicks, rubs, or gallops.  LUNGS:  Clear to auscultation bilaterally.  ABDOMEN:  Protuberant with positive bowel sounds x4.  She does have  bulging to her lower abdomen.  She does have nontender palpable  fullness, suspicious for  possible hernia.  It does not appear to be  incarcerated or warm to touch at this point in time.  Again, this is  nontender.  There is no hepatosplenomegaly or other mass, although exam  is limited given the patient's body habitus.  EXTREMITIES:  She does have trace lower extremity edema bilaterally.   IMPRESSION:  Veronica Key is a 70 year old female with lower abdominal  bulging, redness, and warmth for the last 5 months from the umbilicus to  the symphysis pubis.  I suspect she may have a ventral hernia and is  going to require further evaluation.  She generally does not have pain  in this area, just redness, warmth, and a pressure sensation.   PLAN:  1. Serum creatinine.  2. CT of the abdomen and pelvis without IV and oral contrast as soon      as possible.  3. We will call her with results and go from there  4. She is hypertensive and mildly tachycardic today.  Nurse  to check      BP & Pulse at home at call PCP if remains elevated.   Addendum:  Pt has OV 01/23/08 for hypertension as it remains elevated at  home.       Lorenza Burton, N.P.  Electronically Signed     KJ/MEDQ  D:  01/20/2008  T:  01/21/2008  Job:  [id number]   cc:   Kirk Ruths, M.D.  Jonathon Bellows, M.D.

## 2010-12-08 NOTE — Telephone Encounter (Signed)
NAMEDORE, OQUIN                   CHART#:  [id number]   DATE:  02/03/2008                       DOB:  06-Oct-1940   I received a page at 20:34 to call the patient [phone].  I returned  the page and the patient noticed that she had severe abdominal pain, is  mostly on the right side.  She rates the pain 10/10 on a pain scale.  I  have asked her to go immediately to the emergency room to be further  evaluated given severity of her pain.  It is notable that she was  scheduled to have an MRI of the abdomen, this past week.  She did not  take her nerve medication and she is on diazepam twice a day.  She did  not take this prior to the MRI of her abdomen and therefore due to her  anxiety and some shortness of breath, the test had to be discontinued.  She did state that she did not want any further workup of her adrenal  lesion, which was found on previous CT on January 23, 2008.  Again, she was  instructed to go immediately to the emergency room tonight, and we will  follow up on this.      Lorenza Burton, N.P.  Electronically Signed     Kassie Mends, M.D.  Electronically Signed    KJ/MEDQ  D:  02/03/2008  T:  02/04/2008  Job:  [id number]

## 2010-12-11 NOTE — Op Note (Signed)
[facility]  Patient:    Veronica Key                          MRN: [id number] Proc. Date: 06/15/99 Adm. Date:  [id number] Attending:  Twana First                           Operative Report  PREOPERATIVE DIAGNOSIS:  Right knee degenerative joint disease.  POSTOPERATIVE DIAGNOSIS:  Right knee degenerative joint disease.  PROCEDURE:  Right total knee replacement using [employer] Scorpio total knee system with #7 cemented femur, #7 cemented tibial base plate with a 15 mm polyethylene  tibial spacer and a 26 mm polyethylene cemented patella, with lateral retinacular release.  SURGEON:  Elana Alm. Thurston Hole, M.D.  ASSISTANT:  Kirstin Adelberger, P.A.  ANESTHESIA:  General.  OPERATIVE TIME:  One hour and 25 minutes.  COMPLICATIONS:  None.  DESCRIPTION OF PROCEDURE:  [name] was brought to the operating room on June 15, 1999, and placed on the operating room table in the supine position.  After an adequate level of general anesthesia was obtained, her right knee was examined under anesthesia.  Range of motion from -5 to 125 degrees, moderate valgus deformity.  The knee was stable to ligamentous examination.  She had a Foley catheter placed under sterile conditions, and received 1 g of IV Ancef preoperatively for prophylaxis.  The right leg was then prepped using sterile Betadine and draped using a sterile technique.  Initially the leg was exsanguinated and a thigh tourniquet elevated to 350 mm.  Initially through a 20 cm longitudinal incision, initial exposure was made. The underlying subcutaneous tissues were incised in line with the skin incision.  A median arthrotomy was performed, revealing an excessive amount of normal-appearing joint fluid.  The articular surfaces were evaluated and she was found to have complete grade 4 changes laterally, grade 3 and 4 changes medially and in the patellofemoral joint.  The  medial and  lateral meniscal remnants were removed, as well as the anterior cruciate ligament, as well as the osteophytes on the femur and around the patella.  A drill was then drilled up the femoral canal, for placement of the distal femoral cutting jig, which was then placed and set in the proper amount of rotation, and then a  12.0 mm distal femoral cut was made.  The distal femur was incised.  A #7 was felt to be the appropriate size, and then the #7 cutting jig was placed in the anterior, and posterior, and chamfer cuts were made.  The proximal tibia was exposed. The tibial spines were removed with an oscillating saw.  Intramedullary drill drilled down the tibial canal for placement of the proximal tibial cutting jig, which was then placed and a 6 mm proximal tibial cut was made in the appropriate amount of rotation.  This bone was then removed.  A #7 tibial tray was placed on the cut tibial surface and found to be an excellent fit.  The Scorpio PCL sacrificing cutter was then placed on the distal femur, and the PCL cuts were made.  After his was done, the #7 femoral trial was placed, a #7 tibial trial, with first a 12 mm, followed by a 15 mm polyethylene spacer.  The 15 mm spacer allowed full extension and flexion up to 120 degrees, with no lift-off on the tray, and excellent stability.  The tray was marked for rotation.  The components were removed, and  then the keel cut was made in the proximal tibia.  The patella was incised.  A 6 mm size was felt to be the appropriate size, and a recessed 10 mm x 26 mm cut was made, and three locking holes placed.  After this was done, it was felt that all the trial components were of excellent size, fit, and stability.  The knee was hen jet lavaged and irrigated with 3 L of saline solution.  The proximal tibia was hen exposed, and the #7 tibial base plate with cement backing was hammered into position with an excellent fit.  The #7 femoral  component with cement backing was hammered into position with an excellent fit, with excess cement being removed rom around the edges.  The 15 mm polyethylene spacer was locked on the tibial base plate, and then the knee reduced and taken through a range of motion and found o be stable 0-125 degrees.  The patella prosthesis was locked into its recessed hole, and held there with a clamp, and excess cement being removed.  After all the cement hardened, the patellofemoral tracking was evaluated.  There was significant lateral subluxation in tracking the patella, and thus a lateral retinacular release was  carried out, making the patella tracking normal.  After this was done, it was felt that all the components were of excellent size, fit, and stability.  The knee was further irrigated with antibiotic solution and then the arthrotomy closed with 2 Panacryl suture over two medium Hemovac drains.  The subcutaneous tissues were closed with #0 and #2-0 Vicryl.  The skin was closed with skin staples. Sterile dressings were applied.  The Hemovac injected with 0.25% Marcaine with epinephrine and clamped.  The tourniquet was released.  The patient was turned lateral where an epidural catheter was placed for postoperative pain control.  She was then awakened and taken to the recovery room in stable condition.  The needle and sponge counts were correct x 2 at the end of the case. DD:  06/15/99 TD:  06/16/99 Job: [id number]

## 2010-12-11 NOTE — Op Note (Signed)
NAME:  Veronica Key, Veronica Key                            ACCOUNT NO.:  [id number]   MEDICAL RECORD NO.:  [id number]                   PATIENT TYPE:  AMB   LOCATION:  DAY                                  FACILITY:  [facility]   PHYSICIAN:  Barbaraann Barthel, M.D.              DATE OF BIRTH:  27-Jun-1941   DATE OF PROCEDURE:  09/09/2003  DATE OF DISCHARGE:                                 OPERATIVE REPORT   SURGEON:  Barbaraann Barthel, M.D.   PREOPERATIVE DIAGNOSIS:  Umbilical hernia.   POSTOPERATIVE DIAGNOSIS:  Umbilical hernia.   PROCEDURE:  Umbilical herniorrhaphy.   SPECIMENS:  Umbilical hernia sac.   INDICATIONS FOR PROCEDURE:  This is a 70 year old white female who had a  symptomatic umbilical hernia.  We had planned to take care of her somewhere  around December; however, we postponed this until after the holidays.  She  was admitted via the outpatient department, and after correcting her  hyponatremia with fluid restriction, she was given a preoperative breathing  treatment, as she does have some chronic obstructive pulmonary disease.   GROSS OPERATIVE FINDINGS:  Those consistent with an umbilical hernia,  nonincarcerated.  It was approximately the size of a 50 cent piece.  I did  not feel the need for any mesh was there.   TECHNIQUE:  The patient was placed in the supine position.  After the  adequate administration of general anesthesia via endotracheal intubation,  her entire abdomen was prepped Betadine solution and draped in the usual  manner.   A curvilinear infraumbilical incision was carried out through the skin and  subcutaneous tissue down to the fascia.  The hernia sac was dissected free,  carefully dissecting this from the skin of the umbilicus in order not to  button hole the skin of the umbilicus.  The hernia sac was then opened.  The  redundant portion of it was removed, and the umbilical hernia was closed  transversely with a two-layer closure using 0 monofilament Novofil.   We then  used approximately 8 cc of 0.5% Sensorcaine periumbilically to help with  postoperative comfort.  We closed the subcutaneous layer after tacking the  umbilicus down to the fascia in order to restore its natural concave  appearance.  After irrigating with normal saline solution, the skin was  approximated with the stapling device.  Prior to closure, all  sponge, needle, and instrument counts were found to be correct.  Estimated  blood loss was minimal.  The patient received approximately 900 cc of  crystalloid intraoperatively.  No drains were placed, and there were no  complications.      ___________________________________________                                            Barbaraann Barthel, M.D.  WB/MEDQ  D:  09/09/2003  T:  09/09/2003  Job:  [id number]   cc:   Kirk Ruths, M.D.  [address]  [place]  [place] [address]  Fax: [phone]

## 2010-12-11 NOTE — Op Note (Signed)
NAME:  Veronica Key, Veronica Key                            ACCOUNT NO.:  [id number]   MEDICAL RECORD NO.:  [id number]                   PATIENT TYPE:  AMB   LOCATION:  DAY                                  FACILITY:  [facility]   PHYSICIAN:  Ky Barban, M.D.            DATE OF BIRTH:  [birth year]   DATE OF PROCEDURE:  DATE OF DISCHARGE:                                 OPERATIVE REPORT   PREOPERATIVE DIAGNOSIS:  Stress urinary incontinence.   POSTOPERATIVE DIAGNOSIS:  Stress urinary incontinence.   PROCEDURE:  Tension-free vaginal tape.   SURGEON:  Ky Barban, M.D.   ANESTHESIA:  Spinal.   DESCRIPTION OF PROCEDURE:  The patient was given spinal anesthesia and  placed in the lithotomy position after the usual prep and drape.  Suprapubic  area was marked on each side of the midline near the level of the pubic  tubercle.  Vaginal speculum was placed. A #20 Foley catheter inserted into  the bladder.  The base of the midurethra infiltrated with 10 cc of normal  saline.  A 1-cm incision is made right over the midurethra ending about 1.5  cm proximal to the urethral meatus.  Then on each side of the urethra the  plane was developed for the introduction of the trocar.   The Foley catheter is removed and catheter with a trocar is introduced to  deflect the bladder neck ot the right side.  The tension-free vaginal tape  trochar was introduced on the left side of the urethra and passed behind the  pubic symphysis going alongside the back of the pubic bone, exited at the  marked site at the level of the pubic tubercle.  Now the catheter is taken  out and the bladder is filled up with 300 cc of saline and it is inspected  with the right-angle lens to make sure that the trocar is outside the  bladder.   Now the bladder is emptied and the trocar is pulled along with the blue cord  in the suprapubic area.  The blue cord length was cut, and part of the blue  cord was left in place and hemostats  applied to both ends.  Similarly the  trocar was placed on the right side.  The bladder was inspected to make sure  that it was outside of the bladder and the trocar was removed leaving  hemostats on both side of the blue cord.   Now the special tape is attached to the vaginal end of the blue cords and  both ends of the tape pulled up in the suprapubic area. #20 Foley catheter  inserted into the bladder.  The tape is lying gently against the urethra.  I  have put a cuffed May scissors between the urethra and the tape and pulled  on both ends of the tape to remove any redundancy from the tape.  The tape  plastic covering  is separated in the midline and now both plastic coverings  were gently removed from the tape.  The Mayo scissors are removed. I can see  the tape.  It is lying gently across the  midurethra.  It is not tied.  The operative site is irrigated with saline  and the vaginal wound is closed with interrupted sutures of 3-0 Vicryl.  The  speculum and the packing from the vagina is removed.  The redundant tape  from the suprapubic site is removed and the wound was dressed.  The patient  left the operating room in satisfactory condition.      ___________________________________________                                            Ky Barban, M.D.   MIJ/MEDQ  D:  08/09/2003  T:  08/09/2003  Job:  [id number]

## 2010-12-11 NOTE — H&P (Signed)
NAME:  Veronica Key, RADEBAUGH                            ACCOUNT NO.:  [id number]   MEDICAL RECORD NO.:  [id number]                   PATIENT TYPE:  AMB   LOCATION:  DAY                                  FACILITY:  [facility]   PHYSICIAN:  Ky Barban, M.D.            DATE OF BIRTH:  1940-08-18   DATE OF ADMISSION:  DATE OF DISCHARGE:                                HISTORY & PHYSICAL   CHIEF COMPLAINT:  Stress urinary incontinence.   A 70 year old female, came to me basically for a cyst on her left kidney but  also told me that she had urgency and stress urinary incontinence.  She has  to use a lot of pads during the daytime, and she wants something done about  it.  The workup showed that she had normal cystometrics, and cystoscopy  showed that she has stress incontinence, which goes away with the elevation  of the bladder neck, so I have advised her to undergo tension-free vaginal  tape.  The procedure risks, complications, especially bleeding and urinary  retention, and eventually removal of the tape, are discussed.  She  understands and wanted to proceed.   PAST MEDICAL HISTORY:  She has no history of diabetes.  She does have  hypertension and both knee replacement.  Had a hysterectomy and  cholecystectomy in the past.   PERSONAL HISTORY:  She smokes a half-pack a day.  Does not drink alcohol.   REVIEW OF SYSTEMS:  Unremarkable.   PHYSICAL EXAMINATION:  GENERAL:  Moderately obese.  VITAL SIGNS:  Blood pressure 155/77, temperature is 97.5.  CENTRAL NERVOUS SYSTEM:  Negative.  HEENT:  Head, neck, eye, ENT negative.  CHEST:  Symmetrical.  CARDIAC:  Regular sinus rhythm.  ABDOMEN:  Soft, flat.  Liver, spleen, and kidneys are not palpable.  No CVA  tenderness.  PELVIC:  No adnexal mass or tenderness.   IMPRESSION:  1. Stress urinary incontinence.  2. Hypertension.   PLAN:  Tension-free vaginal tape as outpatient under anesthesia.     ___________________________________________                                       Ky Barban, M.D.   MIJ/MEDQ  D:  08/08/2003  T:  08/08/2003  Job:  [id number]

## 2010-12-11 NOTE — Op Note (Signed)
NAME:  Veronica Key, Veronica Key                            ACCOUNT NO.:  [id number]   MEDICAL RECORD NO.:  [id number]                   PATIENT TYPE:  INP   LOCATION:  9399                                 FACILITY:  [facility]   PHYSICIAN:  [place] B. Earlene Plater, M.D.               DATE OF BIRTH:  04/11/1941   DATE OF PROCEDURE:  01/28/2004  DATE OF DISCHARGE:                                 OPERATIVE REPORT   PREOPERATIVE DIAGNOSIS:  Right pelvic mass.   POSTOPERATIVE DIAGNOSIS:  Right pelvic mass.   PROCEDURE:  Exploratory laparotomy, lysis of adhesions, and right salpingo-  oophorectomy.   SURGEON:  Chester Holstein. Earlene Plater, M.D.   ASSISTANT:  Pershing Cox, M.D.   FINDINGS:  Extensive pelvic adhesions including the pelvic mass to the  bladder, vaginal cuff, rectosigmoid and pelvic sidewall.  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 The left ovary  appeared normal but was densely adherent to the rectosigmoid and pelvic  sidewall.  The ureter could not be well identified within this mass of  adhesions and therefore the left ovary was left in situ.   SPECIMENS:  Right tube and ovary.   COMPLICATIONS:  None.   INDICATIONS FOR PROCEDURE:  The patient was referred by Langley Gauss,  M.D., in [place], for treatment of a right-sided pelvic mass.  She was  seen by Dr. Pershing Cox preoperatively for consultation in case  malignancy was identified.  She had a bowel prep preoperatively and presents  for definitive surgical diagnosis and treatment.  The patient was advised of  the risks of surgery prior to surgery including infection, bleeding, damage  to bowel, bladder or surrounding organs and potential additional  complications should lymph node dissection be needed including increased of  bleeding and length of cyst formation and bowel injury.   DESCRIPTION OF PROCEDURE:  The patient was taken to the operating room and  general anesthesia obtained. She was  prepped and draped in standard fashion  and the bladder drained with a Foley catheter.   Examination under anesthesia was limited to the patient's obesity, however,  a mass was palpable in the midline on bimanual examination which was felt to  be about 8 to 10 cm in diameter.   Vertical incision was made with the knife and carried sharply to the fascia.  The fascia was divided sharply and elevated with the Kocher clamps.  The  rectus sheath was dissected off sharply.  Posterior sheath and peritoneum  were entered sharply and extended inferiorly sharply.   Balfour was inserted.  Bowel mobilized superiorly but was noted to be  densely adherent as outlined above.  the adhesions from the pelvic mass to  the rectosigmoid were taken down sharply as were the adhesions to the  bladder flap.   The peritoneum over the right psoas muscle was then elevated and entered  sharply.  Retroperitoneal  space developed bluntly and course of the ureter  identified.  The right infundibulopelvic ligament was isolated and doubly  clamped, divided and suture ligated with 0 Vicryl x2 with hemostasis  obtained.  The ureter was bluntly dissected away from the surrounding  retroperitoneum inferiorly to isolate it away from the pelvic mass.  The  surrounding adhesions were taken down sharply to the level of the vaginal  apex.  This area was clamped with a right ankle clamp, divided sharply and  suture ligated with 0 Vicryl with hemostasis obtained. The specimen was sent  for frozen section which showed benign results, as outlined above.   The left ovary was inspected and was found to be densely adherent to the  left side of the rectosigmoid as well as the pelvic sidewall.  We opened the  peritoneum over the left psoas muscle and isolated the left IP ligament.  However, the course of the left ureter was more difficult to determine as it  was involved in adhesions along the sigmoid colon medially.  This prevented   good isolation of the ureter and therefore my opinion, although the IP  ligament was safely identified, I felt that I could not trace the ureter  distally to dissect the ovary off.  In that this did not appear to be a safe  option, I felt it was more dangerous to proceed than to leave what appeared  to be a normal ovary in situ.  Therefore, the left ovary was left  undisturbed and the procedure terminated.   Pelvis was irrigated and was made hemostatic with Bovie cautery along the  peritoneal edge on the right side, taking care to avoid the ureter.   The bowel was returned to the anatomic position and the fascia closed with a  running stitch of double stranded 0 PDS suture.  The subcutaneous tissue was  irrigated and made hemostatic with the Bovie and reapproximated with running  stitch of 0 Vicryl.  The skin was closed with staples.   The patient tolerated the procedure well and there were no complications.  She was taken to the recovery room awake, alert and in stable condition.  All counts were correct per the operating room staff.                                               Gerri Spore B. Earlene Plater, M.D.    WBD/MEDQ  D:  01/29/2004  T:  01/29/2004  Job:  [id number]   cc:   Langley Gauss, M.D.  [address]  [place]  [place] [address]  Fax: [phone]

## 2010-12-11 NOTE — Discharge Summary (Signed)
NAME:  Veronica Key, Veronica Key                            ACCOUNT NO.:  [id number]   MEDICAL RECORD NO.:  [id number]                   PATIENT TYPE:  INP   LOCATION:  9306                                 FACILITY:  [facility]   PHYSICIAN:  Gerri Spore B. Earlene Plater, M.D.               DATE OF BIRTH:  06/24/1941   DATE OF ADMISSION:  01/29/2004  DATE OF DISCHARGE:  01/31/2004                                 DISCHARGE SUMMARY   PREOPERATIVE DIAGNOSIS:  Right-sided pelvic mass.   POSTOPERATIVE DIAGNOSIS:  Right-sided pelvic mass.   PROCEDURE:  Exploratory laparotomy and lysis of adhesions, right salpingo-  oophorectomy.   INDICATIONS FOR PROCEDURE:  The patient referred from Dr. Lisette Grinder in  [place], [place], with a right-sided 10 cm pelvic mass with a  single septation versus two separate cysts.  The patient had a bowel prep  prior to surgery and was prepared for staging if necessary.   HOSPITAL COURSE:  The patient was admitted on the day of surgery and  underwent the above procedure.  Findings at the time of surgery included  extensive pelvic adhesions with adhesions involving the right-sided pelvic  mass to the bladder and rectum. These were taken down sharply without  difficulty and specimen submitted intact to pathology.  Frozen section  analysis showed a benign cyst, likely a benign serocyst adenoma and possibly  hydrosalpinx.  the left side was densely adherent to the rectosigmoid and  pelvic sidewall but appeared normal.  Given this normal appearance but  extreme adhesions, I made the decision to leave the ovary in situ as I felt  it would be a risk here to remove the normal ovary than to proceed with  extirpation.   Postoperatively, the patient rapidly regained her ability to ambulate, void  and tolerate a regular diet.  She was discharged home on the second  postoperative day in satisfactory condition.   DISCHARGE MEDICATIONS:  1. The patient will be restarted on all of her blood pressure  medications on     all of her other medications as prior to admission.  2. Also she was prescribed Tylox one to two tablets every four to six hours     for pain.   FOLLOW UP:  [facility], Dr. Earlene Plater, one week for staple removal and four  weeks for routine postoperative visit.   CONDITION ON DISCHARGE:  Satisfactory.   DISCHARGE INSTRUCTIONS:  Routine per pamphlet.                                               Gerri Spore B. Earlene Plater, M.D.    WBD/MEDQ  D:  01/31/2004  T:  01/31/2004  Job:  [id number]   cc:   Langley Gauss, M.D.  [address]  [place]  [place] [address]  Fax: [phone]

## 2010-12-18 ENCOUNTER — Other Ambulatory Visit: Payer: Self-pay | Admitting: Orthopedic Surgery

## 2010-12-24 ENCOUNTER — Other Ambulatory Visit: Payer: Self-pay | Admitting: Orthopedic Surgery

## 2010-12-24 DIAGNOSIS — M25511 Pain in right shoulder: Secondary | ICD-10-CM

## 2011-01-01 ENCOUNTER — Other Ambulatory Visit: Payer: Self-pay

## 2011-01-05 ENCOUNTER — Ambulatory Visit
Admission: RE | Admit: 2011-01-05 | Discharge: 2011-01-05 | Disposition: A | Discharge status: Home / Self Care | Payer: Medicaid Other | Source: Ambulatory Visit | Attending: Orthopedic Surgery | Admitting: Orthopedic Surgery

## 2011-01-05 DIAGNOSIS — M25511 Pain in right shoulder: Secondary | ICD-10-CM

## 2011-01-05 MED ORDER — GADOBENATE DIMEGLUMINE 529 MG/ML IV SOLN
15.0000 mL | Freq: Once | INTRAVENOUS | Status: AC | PRN
  Administered 2011-01-05: 15 mL via INTRAVENOUS

## 2011-01-15 ENCOUNTER — Other Ambulatory Visit (HOSPITAL_COMMUNITY): Payer: Self-pay | Admitting: Orthopedic Surgery

## 2011-01-15 DIAGNOSIS — M869 Osteomyelitis, unspecified: Secondary | ICD-10-CM

## 2011-01-22 ENCOUNTER — Ambulatory Visit (HOSPITAL_COMMUNITY)
Admission: RE | Admit: 2011-01-22 | Discharge: 2011-01-22 | Disposition: A | Discharge status: Home / Self Care | Payer: PRIVATE HEALTH INSURANCE | Source: Ambulatory Visit | Attending: Orthopedic Surgery | Admitting: Orthopedic Surgery

## 2011-01-22 DIAGNOSIS — M24419 Recurrent dislocation, unspecified shoulder: Secondary | ICD-10-CM | POA: Insufficient documentation

## 2011-01-22 DIAGNOSIS — M869 Osteomyelitis, unspecified: Secondary | ICD-10-CM

## 2011-01-22 LAB — CBC
MCH: 28.4 pg (ref 26.0–34.0)
MCHC: 33.8 g/dL (ref 30.0–36.0)
Platelets: 277 10*3/uL (ref 150–400)
RBC: 4.72 MIL/uL (ref 3.87–5.11)
RDW: 13.4 % (ref 11.5–15.5)

## 2011-01-22 LAB — PROTIME-INR
INR: 0.98 (ref 0.00–1.49)
Prothrombin Time: 13.2 seconds (ref 11.6–15.2)

## 2011-01-25 LAB — TISSUE CULTURE: Culture: NO GROWTH

## 2011-01-25 LAB — BODY FLUID CULTURE
Culture: NO GROWTH
Gram Stain: NONE SEEN

## 2011-04-20 ENCOUNTER — Other Ambulatory Visit (HOSPITAL_COMMUNITY): Payer: Self-pay | Admitting: Orthopedic Surgery

## 2011-04-20 ENCOUNTER — Other Ambulatory Visit: Payer: Self-pay | Admitting: Orthopedic Surgery

## 2011-04-20 ENCOUNTER — Encounter (HOSPITAL_COMMUNITY)
Admission: RE | Admit: 2011-04-20 | Discharge: 2011-04-20 | Disposition: A | Discharge status: Home / Self Care | Payer: Medicare Other | Source: Ambulatory Visit | Attending: Orthopedic Surgery | Admitting: Orthopedic Surgery

## 2011-04-20 DIAGNOSIS — M19019 Primary osteoarthritis, unspecified shoulder: Secondary | ICD-10-CM

## 2011-04-20 LAB — COMPREHENSIVE METABOLIC PANEL
ALT: 12 U/L (ref 0–35)
AST: 13 U/L (ref 0–37)
Albumin: 4.3 g/dL (ref 3.5–5.2)
CO2: 24 mEq/L (ref 19–32)
Calcium: 10.2 mg/dL (ref 8.4–10.5)
Chloride: 94 mEq/L — ABNORMAL LOW (ref 96–112)
Creatinine, Ser: 0.63 mg/dL (ref 0.50–1.10)
GFR calc non Af Amer: >60 mL/min (ref >60)
Sodium: 128 mEq/L — ABNORMAL LOW (ref 135–145)

## 2011-04-20 LAB — APTT: aPTT: 29 seconds (ref 24–37)

## 2011-04-20 LAB — CBC
HCT: 36.6 % (ref 36.0–46.0)
MCV: 86.1 fL (ref 78.0–100.0)
Platelets: 258 10*3/uL (ref 150–400)
RBC: 4.25 MIL/uL (ref 3.87–5.11)
WBC: 6.8 10*3/uL (ref 4.0–10.5)

## 2011-04-20 LAB — ABO/RH: ABO/RH(D): B POS

## 2011-04-20 LAB — TYPE AND SCREEN: Antibody Screen: NEGATIVE

## 2011-04-20 LAB — SURGICAL PCR SCREEN: Staphylococcus aureus: POSITIVE — AB

## 2011-04-20 LAB — PROTIME-INR: INR: 0.99 (ref 0.00–1.49)

## 2011-04-27 ENCOUNTER — Other Ambulatory Visit: Payer: Self-pay | Admitting: Orthopedic Surgery

## 2011-04-27 ENCOUNTER — Inpatient Hospital Stay (HOSPITAL_COMMUNITY)
Admission: RE | Admit: 2011-04-27 | Discharge: 2011-05-03 | DRG: 483 | Disposition: A | Discharge status: Skilled Nursing Facility | Payer: Medicare Other | Source: Ambulatory Visit | Attending: Orthopedic Surgery | Admitting: Orthopedic Surgery

## 2011-04-27 ENCOUNTER — Inpatient Hospital Stay (HOSPITAL_COMMUNITY): Payer: Medicare Other

## 2011-04-27 DIAGNOSIS — D62 Acute posthemorrhagic anemia: Secondary | ICD-10-CM | POA: Diagnosis not present

## 2011-04-27 DIAGNOSIS — F341 Dysthymic disorder: Secondary | ICD-10-CM | POA: Diagnosis present

## 2011-04-27 DIAGNOSIS — Z79899 Other long term (current) drug therapy: Secondary | ICD-10-CM

## 2011-04-27 DIAGNOSIS — K219 Gastro-esophageal reflux disease without esophagitis: Secondary | ICD-10-CM | POA: Diagnosis present

## 2011-04-27 DIAGNOSIS — F17200 Nicotine dependence, unspecified, uncomplicated: Secondary | ICD-10-CM | POA: Diagnosis present

## 2011-04-27 DIAGNOSIS — J45909 Unspecified asthma, uncomplicated: Secondary | ICD-10-CM | POA: Diagnosis present

## 2011-04-27 DIAGNOSIS — Z22322 Carrier or suspected carrier of Methicillin resistant Staphylococcus aureus: Secondary | ICD-10-CM

## 2011-04-27 DIAGNOSIS — E871 Hypo-osmolality and hyponatremia: Secondary | ICD-10-CM | POA: Diagnosis not present

## 2011-04-27 DIAGNOSIS — Z01812 Encounter for preprocedural laboratory examination: Secondary | ICD-10-CM

## 2011-04-27 DIAGNOSIS — M8708 Idiopathic aseptic necrosis of bone, other site: Principal | ICD-10-CM | POA: Diagnosis present

## 2011-04-27 DIAGNOSIS — M19019 Primary osteoarthritis, unspecified shoulder: Secondary | ICD-10-CM | POA: Diagnosis present

## 2011-04-27 DIAGNOSIS — Z01818 Encounter for other preprocedural examination: Secondary | ICD-10-CM

## 2011-04-27 DIAGNOSIS — Z23 Encounter for immunization: Secondary | ICD-10-CM

## 2011-04-27 DIAGNOSIS — I10 Essential (primary) hypertension: Secondary | ICD-10-CM | POA: Diagnosis present

## 2011-04-27 LAB — CBC
MCHC: 33.8 g/dL (ref 30.0–36.0)
MCV: 87.6 fL (ref 78.0–100.0)
Platelets: 283 10*3/uL (ref 150–400)
RBC: 4.51 MIL/uL (ref 3.87–5.11)

## 2011-04-27 LAB — BASIC METABOLIC PANEL
BUN: 6 mg/dL (ref 6–23)
Calcium: 9.7 mg/dL (ref 8.4–10.5)
Chloride: 96 mEq/L (ref 96–112)
Creatinine, Ser: 0.57 mg/dL (ref 0.4–1.2)
GFR calc Af Amer: >60 mL/min (ref >60)
GFR calc non Af Amer: >60 mL/min (ref >60)

## 2011-04-28 LAB — BASIC METABOLIC PANEL
BUN: 6 mg/dL (ref 6–23)
CO2: 24 mEq/L (ref 19–32)
Chloride: 98 mEq/L (ref 96–112)
GFR calc non Af Amer: >90 mL/min (ref >90)
Glucose, Bld: 135 mg/dL — ABNORMAL HIGH (ref 70–99)
Potassium: 3.8 mEq/L (ref 3.5–5.1)

## 2011-04-28 LAB — CBC
MCV: 86.9 fL (ref 78.0–100.0)
Platelets: 220 10*3/uL (ref 150–400)
RDW: 14.4 % (ref 11.5–15.5)
WBC: 9.8 10*3/uL (ref 4.0–10.5)

## 2011-04-29 LAB — WOUND CULTURE

## 2011-04-29 LAB — CBC
Platelets: 205 10*3/uL (ref 150–400)
RBC: 3.69 MIL/uL — ABNORMAL LOW (ref 3.87–5.11)
WBC: 10.4 10*3/uL (ref 4.0–10.5)

## 2011-04-29 LAB — BASIC METABOLIC PANEL
CO2: 24 mEq/L (ref 19–32)
Chloride: 100 mEq/L (ref 96–112)
Potassium: 4 mEq/L (ref 3.5–5.1)
Sodium: 130 mEq/L — ABNORMAL LOW (ref 135–145)

## 2011-04-29 NOTE — Discharge Summary (Signed)
  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  Additional diagnoses include: 1. Positive Methicillin resistant Staphylococcus aureus, polymer chain     reaction screen by nasal swab. 2. Acute blood loss anemia, minor. 3. Chronic obstructive pulmonary disease.  HOSPITAL COURSE:  [name] is a 70 year old woman who elected for reverse total shoulder arthroplasty of the right shoulder.  She had an extensive preoperative workup to evaluate for possible infection, all of which was negative.  She underwent surgical intervention and tolerated the procedure well.  Postoperatively, she did not have any complications.  She was put on contact precautions because of positive MRSA screen.  She was given perioperative vancomycin for antimicrobial prophylaxis.  Her cultures taken at the time of surgery were negative, as was the intraoperative white blood cells per high-powered field.  Her wounds were clean and dry throughout her hospital stay, although she has chronic red induration around the right shoulder which is her baseline. She did have a Foley catheter which was discontinued, and she is planned to be discharged to skilled nursing when that service is available.  She should be using a walker for ambulatory assistance.  I will plan to see her back in another 1 week for wound check at the office, [phone].  She benefited maximally from her hospital stay.     Eulas Post, MD     JPL/MEDQ  D:  04/29/2011  T:   04/29/2011  Job:  [id number]  Electronically Signed by Teryl Lucy MD on 04/29/2011 10:39:27 AM

## 2011-04-29 NOTE — Op Note (Signed)
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  OPERATIVE PROCEDURE:  Right shoulder reverse total shoulder arthroplasty.  ANESTHESIA:  General with a regional block.  ESTIMATED BLOOD LOSS:  200 mL.  OPERATIVE IMPLANTS:  Biomet size 9 Press-Fit humeral stem with a mini baseplate and 36-mm glenosphere with a standard polyethylene insert and 4 locking screws, two size 15 mm and two size 25 mm.  Dr. Jones Broom was present and scrubbed throughout the critical portions of the case, and assisted with decision making, exposure, instrumentation.  PREOPERATIVE INDICATIONS:  [name] is a 70 year old woman who has had a long history of problems with her right shoulder.  She had a shoulder arthroscopy with debridement for massive irreparable cuff tear done about a year and half ago.  After that surgery, her skin developed a fairly indurated red appearance to it.  I brought her back emergently for surgical I and D and I placed her on IV antibiotics.  All the cultures were negative.  I never had any significant elevation in her CRP or sed rate.  She subsequently had very poor outcome, and had chronic shoulder pain with complete loss of function.  She elected for reverse total shoulder replacement.  Preoperatively, I again worked her up for infection, which included an MRI to evaluate for  osteomyelitis, as well as repeat laboratory values which were all normal, as well as even a bone biopsy done by Interventional Radiology, which did not demonstrate any evidence for infection.  Therefore, she elected for reverse total shoulder replacement.  The risks, benefits and alternatives were discussed at length with her including but not limited to risks of infection, bleeding, nerve injury, dislocation, the need for Girdlestone resection, loss of function, bleeding, nerve injury, cardiopulmonary complication, particularly in light of her poor pulmonary function, among others and she was willing to proceed.  OPERATIVE PROCEDURE:  The patient was brought to the operating room and placed in supine position.  She did have a positive MRSA screen, by PCR, which I gave vancomycin for.  General anesthesia was administered after regional block.  She was placed in a beach-chair position.  The right upper extremity was prepped and draped in the usual sterile fashion. Deltopectoral approach was performed after time-out was carried out. Cephalic vein was retracted laterally.  I released the subscapularis and the upper border of the pectoralis insertion.  The biceps tendon had already been released.  The skin itself was read, as it has been for over the past year, however, cutting through the skin it had somewhat of an indurated appearance, but  there was no evidence for infection or abscess or fluid collection.  As I went down through the deltopectoral interval, again, everything looked completely normal from an infection standpoint.  She had gross distortion of her anatomy within the humeral head.  I released the subscapularis, and mobilized the deltoid, and then dislocated the humeral head.  This was a very abnormal shape to her humeral head.  Once I had exposure and I  released the capsule off the neck, I resected the proximal humerus with a saw at approximately 20 degrees of retroversion.   Landmarks were extremely difficult to identify, and she had no rotator cuff or infraspinatus or teres minor attachment.  I then placed deep retractors and exposed the glenoid.  The glenoid itself had a very abnormal shape, and there was a cavity to it that was due to the articulation with the extremely abnormal head.  Nonetheless, I identified my landmarks as best as possible, used the guidepin placed with very slight inferior inclination, in the center position of the glenoid.  I then reamed and removed the inferior portion of chondral bone, more so than the superior, and had excellent concentric position of the future implant.  I also had complete vault fixation with bone all around.  I then placed a trial and then impacted my real baseplate into place and secured it with four locking screws after I had secured it with a central nonlocking screw.  Excellent fixation of the baseplate was achieved and then I placed the real glenosphere.  This seated down well.  I then turned my attention to the proximal humerus.  This was still fairly tight and so I resected some additional bone.  I then broached sequentially, and I was able to hand ream up to a size 12, however, with the broach the broaches were extremely tight particularly around the proximal metaphysis, and I was not able to get anything larger than a 9 to sit down.  The 9 was extremely tight in fact.  Therefore, I used a 9 broach and trialed with the standard metaglene.  Standard was actually still very tight, and I in fact went back two more times and resected more neck in order to gain the appropriate space for the implant.  Once I was able to reduce it, although it was still fairly tight, I did use a shoehorn to reduce it, I was satisfied that I had gotten the neck cut low enough and that I was capable of reducing the real implant. Therefore, the 9 was implanted and then I placed the real metaglene into position.  This was  reduced with the shoehorn, and although it was slightly tight, I did have excellent overall stability.  The cut was already fairly low and I did not want to take more bone, as I was actually not that far off from the pectoralis insertion.  Overall, excellent stability and reconstruction have been achieved and I irrigated with pulse lavage.  I did send intraoperative analysis of the humeral head with white blood cells per high-power field, and this came back essentially abnormal with some reactive chronic inflammatory tissue, but no evidence for infection, and I believe that the white blood cells per high-power field was only one.  I also sent some fluid from the medullary canal of the humerus, which appeared to be relatively normal marrow tissue.  I then irrigated all of the components again with pulse lavage and repaired the subcutaneous tissue with Vicryl with routine closure.  Steri-Strips  and sterile gauze was applied.  She was then awakened and returned to the PACU in stable and satisfactory condition.  There were no complications.  She tolerated the procedure well.     Veronica Post, MD     JPL/MEDQ  D:  04/27/2011  T:  04/27/2011  Job:  [id number]  Electronically Signed by Teryl Lucy MD on 04/29/2011 10:39:24 AM

## 2011-04-30 LAB — CBC
MCV: 87.8 fL (ref 78.0–100.0)
Platelets: 290 10*3/uL (ref 150–400)
RDW: 13.6 % (ref 11.5–15.5)
WBC: 9.8 10*3/uL (ref 4.0–10.5)

## 2011-04-30 LAB — GLUCOSE, CAPILLARY
Glucose-Capillary: 103 mg/dL — ABNORMAL HIGH (ref 70–99)
Glucose-Capillary: 117 mg/dL — ABNORMAL HIGH (ref 70–99)
Glucose-Capillary: 121 mg/dL — ABNORMAL HIGH (ref 70–99)

## 2011-04-30 LAB — DIFFERENTIAL
Basophils Absolute: 0.1 10*3/uL (ref 0.0–0.1)
Eosinophils Absolute: 0.3 10*3/uL (ref 0.0–0.7)
Eosinophils Relative: 3 % (ref 0–5)
Lymphocytes Relative: 16 % (ref 12–46)
Lymphs Abs: 1.6 10*3/uL (ref 0.7–4.0)
Neutrophils Relative %: 74 % (ref 43–77)

## 2011-04-30 LAB — BASIC METABOLIC PANEL
BUN: 3 mg/dL — ABNORMAL LOW (ref 6–23)
Creatinine, Ser: 0.43 mg/dL (ref 0.4–1.2)
GFR calc non Af Amer: >60 mL/min (ref >60)
Glucose, Bld: 98 mg/dL (ref 70–99)

## 2011-05-02 LAB — ANAEROBIC CULTURE

## 2011-05-03 ENCOUNTER — Inpatient Hospital Stay
Admission: RE | Admit: 2011-05-03 | Discharge: 2011-06-09 | Disposition: A | Discharge status: Home / Self Care | Payer: Medicare Other | Source: Ambulatory Visit | Attending: Internal Medicine | Admitting: Internal Medicine

## 2011-05-04 NOTE — Discharge Summary (Signed)
  NAMERASA, DEGRAZIA NO.:  [id number]  MEDICAL RECORD NO.:  [id number]  LOCATION:                                 FACILITY:  PHYSICIAN:  Eulas Post, MD         DATE OF BIRTH:  DATE OF ADMISSION: DATE OF DISCHARGE:                        DISCHARGE SUMMARY - REFERRING   ADDENDUM  Original discharge summary was dated April 29, 2011.  DISCHARGE SUMMARY:  There have been no intervening events or significant changes in her diagnoses.  She was maintained over the weekend while we got her pain under control and monitoring her wounds.  Her pathology report came back with no evidence for infection based on the humeral head specimen sent.  Additionally, all of her cultures came back negative.  Once she was transitioned to full p.o. pain medications and disposition was achieved by skilled nursing, she is planned to be discharged to skilled nursing facility.  There were no interval changes in the diagnoses as indicated on the previous discharge summary.  She remained afebrile throughout her hospital stay.     Eulas Post, MD     JPL/MEDQ  D:  05/03/2011  T:  05/03/2011  Job:  [id number]  Electronically Signed by Teryl Lucy MD on 05/04/2011 04:18:09 PM

## 2011-12-30 ENCOUNTER — Ambulatory Visit (HOSPITAL_COMMUNITY)
Admission: RE | Admit: 2011-12-30 | Discharge: 2011-12-30 | Disposition: A | Discharge status: Home / Self Care | Payer: Medicare Other | Source: Ambulatory Visit | Attending: Neurology | Admitting: Neurology

## 2011-12-30 DIAGNOSIS — E119 Type 2 diabetes mellitus without complications: Secondary | ICD-10-CM | POA: Insufficient documentation

## 2011-12-30 DIAGNOSIS — M6281 Muscle weakness (generalized): Secondary | ICD-10-CM | POA: Insufficient documentation

## 2011-12-30 DIAGNOSIS — IMO0001 Reserved for inherently not codable concepts without codable children: Secondary | ICD-10-CM | POA: Insufficient documentation

## 2011-12-30 DIAGNOSIS — M25619 Stiffness of unspecified shoulder, not elsewhere classified: Secondary | ICD-10-CM | POA: Insufficient documentation

## 2011-12-30 DIAGNOSIS — I10 Essential (primary) hypertension: Secondary | ICD-10-CM | POA: Insufficient documentation

## 2011-12-30 DIAGNOSIS — M25519 Pain in unspecified shoulder: Secondary | ICD-10-CM | POA: Insufficient documentation

## 2011-12-30 NOTE — Evaluation (Signed)
Physical Therapy Evaluation  Patient Details  Name: Veronica Key MRN: [id number] Date of Birth: 1941-06-26  Today's Date: 12/30/2011 Time: 7564-3329 PT Time Calculation (min): 42 min Charges: 1 eval Visit#: 1  of 12   Re-eval: 01/29/12 Assessment Diagnosis: B shoulder pain  Surgical Date: 04/27/11 Next MD Visit: Dr. Gerilyn Pilgrim - 4 weeks Prior Therapy: For shoulder pain   Authorization: MEDICARE  Authorization Time Period:    Authorization Visit#:   of     Past Medical History: No past medical history on file. Past Surgical History: No past surgical history on file.  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  She reports that she has bilateral shoulder pain secondary to surgeries last year.  She is currently taking hydrocodone for pain. She complains of increased thirst, increased craving for sweets and believes she may have DM II, however it is undiagnosed.  Her grand-daughter feels she has increased anxiety and therefore is holding a lot of tension in her shoulders and has diffiiculty relaxing.  Pt reports that she mostly has pain in her shoulders when ambulating or reaching up for something and needs to use UE WB for support.  Patient Stated Goals: I want to be able to use my arms better.  and I want to be able to walk around the flea market without much difficulty  Pain Assessment Currently in Pain?: Yes Pain Score:   7 Pain Location: Shoulder Pain Orientation: Right;Left Pain Type: Chronic pain Effect of Pain on Daily Activities: has difficulty walking due to pain with UE WB.  Prior Functioning  Home Living Lives With: Alone (has grand-daughter with her during the day) Prior Function Leisure: Hobbies-yes (Comment) Comments: she enjoys going to yard sales, [employer], enjoys  Estate manager/land agent music, involved in her church.   Cognition/Observation Observation/Other Assessments Observations: significant UE use while ambulating w/rollator w/trunk flexion.  Scapular dyskinesis Other Assessments: increased anxiety, increased UT activiation.  Unable to reproduce nystagmus on CN exam.  Sensation/Coordination/Flexibility/Functional Tests Functional Tests Functional Tests: ABC: 32% Functional Tests: UEFS: 30/80  Assessment RUE Assessment RUE Assessment: Exceptions to [facility] RUE AROM (degrees) Right Shoulder Flexion: 85 Degrees Right Shoulder ABduction: 82 Degrees Right Shoulder Internal Rotation: 20 Degrees Right Shoulder External Rotation: 5 Degrees RUE Strength RUE Overall Strength Comments: 2+/5 for shoulder motions LUE AROM (degrees) Left Shoulder Flexion: 89 Degrees Left Shoulder ABduction: 90 Degrees Left Shoulder Internal Rotation: 20 Degrees Left Shoulder External Rotation: 5 Degrees LUE Strength LUE Overall Strength Comments: 2+/5 for shoulder  Mobility/Balance  Ambulation/Gait Ambulation/Gait: Yes Ambulation/Gait Assistance: 6: Modified independent (Device/Increase time) Assistive device: Rollator Gait Pattern: Trunk flexed;Lateral hip instability (increased UE usse) Gait velocity: decreased Posture/Postural Control Posture/Postural Control: Postural limitations Postural Limitations: Upper and Lower cross syndromes Static Standing Balance Static Standing - Comment/# of Minutes: Requires supervision for static standing   Exercise/Treatments Standing Standing Eyes Opened: Wide (BOA);Time;Limitations Standing Eyes Opened Time: 5 minutes Standing Eyes Opened Limitations: cueing for appropriate posture from head to toe  Seated Other Seated Exercises: Shoulder rolls backwards 20x  Physical Therapy Assessment and Plan PT Assessment and Plan Clinical Impression Statement: Pt is a 71 year old female who is referred to PT secondary to B shoulder pain  which I feel is likely due to her impaired posture with ambulation, impaired balance secondary to dizziness and increased anxiety from a previously  unfortunate living situation.  Pt reports a decrease in shoulder pain during sitting and when UE is not in a weight beaing position, however has increased pain with ambulating with her rollator and presents with a significant forward flexed posture.   Pt will benefit from skilled therapeutic intervention in order to improve on the following deficits: Abnormal gait;Decreased activity tolerance;Decreased range of motion;Decreased mobility;Decreased coordination;Improper body mechanics;Impaired tone;Pain;Impaired perceived functional ability Rehab Potential: Good PT Frequency: Min 3X/week PT Duration: 4 weeks PT Treatment/Interventions: DME instruction;Gait training;Stair training;Functional mobility training;Therapeutic activities;Therapeutic exercise;Balance training;Neuromuscular re-education;Patient/family education;Manual techniques;Modalities PT Plan: UBE backwards. Concentrate on standing and sitting posture to encourage proper scapular and hip alignment.  complete gentle UE PRE in seated and standing position w.emphasis on posture.     Goals Home Exercise Program Pt will Perform Home Exercise Program: Independently PT Goal: Perform Home Exercise Program - Progress: Goal set today PT Short Term Goals Time to Complete Short Term Goals: 2 weeks PT Short Term Goal 1: Pt will demonstrate appropriate postural mechanics with standing posture.  PT Short Term Goal 2: Pt will demonstrate appropriate gait mechanics when walking with rollator or RW to decrease pressure on shoulders.  PT Short Term Goal 3: Pt will improve UE strength by 1/2 muscle grade. PT Short Term Goal 4: Pt will improve AROM by 3 degrees in each direction.  PT Short Term Goal 5: Pt will report shoulder pain less than 3/10 for 50% of her day.  PT Long Term Goals Time to Complete Long Term  Goals: 4 weeks PT Long Term Goal 1: Pt will improve her ABC score to 42%  and UEFS to 40/80 for improved percieved functional ability.  PT Long Term Goal 2: Pt will improve her AROM by 10 degress in each direction in order to complete necessary ADL's. Long Term Goal 3: Pt will improve postural mechanics when ambulating with RW in order to tolerate walking at the flea market for 60 minutes without an increase in pain.  Long Term Goal 4: Pt will report pain in her shoulder less than 2/10 for 75% of her day for improved QOL.  PT Long Term Goal 5: Pt will improve shoulder strength to 3+/5 throughout  Problem List Patient Active Problem List  Diagnoses  . DIABETES MELLITUS, TYPE II  . ANXIETY  . DEPRESSION  . HYPERTENSION  . BRONCHITIS, CHRONIC  . GERD  . PEPTIC ULCER DISEASE  . OVERACTIVE BLADDER  . ARTHRITIS  . LOW BACK PAIN  . NAUSEA AND VOMITING  . CATARACT, HX OF    PT - End of Session Activity Tolerance: Patient limited by fatigue PT Plan of Care PT Home Exercise Plan: shoulder rolls, standing posture w/shoulder blades down and in PT Patient Instructions: Educated Grand-daughter to work on her posture and breathing.  Explained we will start exercises next session.  Consulted and Agree with Plan of Care: Patient;Family member/caregiver Family Member Consulted: Bambi (grand-daughter)  GP  Functional Reporting Modifier  Current Status  [phone] - Carrying, Moving and Handling Objects CL - At least 60% but less than 80% impaired, limited or restricted  Goal Status  [phone] - Carrying, Moving and Handling Objects CJ - At least 20% but less than 40% impaired, limited or restricted   Veronica Key 12/30/2011, 4:57 PM  Physician Documentation Your signature is required to indicate approval of the treatment plan as stated above.  Please sign and either send electronically or make a copy of this report for your files and return this  physician signed original.   Please mark one 1.__approve  of plan  2. ___approve of plan with the following conditions.   ______________________________                                                          _____________________ Physician Signature                                                                                                             Date

## 2012-01-03 ENCOUNTER — Ambulatory Visit (HOSPITAL_COMMUNITY)
Admission: RE | Admit: 2012-01-03 | Discharge: 2012-01-03 | Disposition: A | Discharge status: Home / Self Care | Payer: Medicare Other | Source: Ambulatory Visit | Attending: Neurology | Admitting: Neurology

## 2012-01-03 NOTE — Progress Notes (Signed)
Physical Therapy Treatment Patient Details  Name: Veronica Key MRN: [id number] Date of Birth: [birth year]  Today's Date: 01/03/2012 Time: 6962-9528 PT Time Calculation (min): 42 min Charges: 67' NMR Visit#: 2  of 12   Re-eval: 01/29/12    Authorization: MEDICARE  Authorization Time Period:    Authorization Visit#: 2  of 10    Subjective: Symptoms/Limitations Symptoms: I have been working on her posture.  Pt is present with her grand-daughter Veronica Key who continues to encourage appropriate posture and exercise.  Pain Assessment Currently in Pain?: Yes Pain Score:  (reports some pain, but is on pain medication) Pain Location: Shoulder Pain Orientation: Right;Left  Exercise/Treatments Seated External Rotation: AAROM;Both;10 reps;Limitations External Rotation Limitations: NMR for proper RTC activation Internal Rotation: AROM;Both;10 reps;Limitations Internal Rotation Limitations: NMR for proper deltoid activation Flexion: Strengthening;AAROM;Both;10 reps;Limitations Flexion Limitations: NMR for proper deltoid activation Abduction: AROM;10 reps;Limitations ABduction Limitations: NMR for proper deltoid activation Other Seated Exercises: Shoulder rolls backwards 20x; NMR for L shoulder approximation Other Seated Exercises: Postural re-alignment w/visual cueing using the mirror Standing ABduction: AROM;Both;10 reps;Limitations ABduction Limitations: NMR for proper scapular rhythm Other Standing Exercises: shoulder back rolls x20 Other Standing Exercises: Postural alignment w/visual cueing w/mirror ROM / Strengthening / Isometric Strengthening UBE (Upper Arm Bike): 3 min backwards for posture re-education  Other ROM/Strengthening Exercises: Elbow Presses 4x10 sec Other ROM/Strengthening Exercises: Gait traning w/VC's and TC's for proper posture to decrease WB through B UE.  1 RT around gym   Physical Therapy Assessment and Plan PT Assessment and Plan Clinical Impression Statement:  Today's treatment focused on NMR to shoulder and scapular stabalizers w/heavy emphasis on proper spinal posture (espcecially neck posture to decrease SB to L).  Seated exercises completed w/towel roll for sacral cueing.  She demonstrates increased difficulty with performing LUE approximation exercise due to lack of awareness of RTC muscles.  Has notable generalized fatigue after treatment today.  Able to independently verbalize postural awareness with sitting, standing and gait.  PT Plan: Concentrate on standing and sitting posture to encourage proper scapular and hip alignment. complete gentle UE PRE in seated and standing position w.emphasis on posture    Goals    Problem List Patient Active Problem List  Diagnoses  . DIABETES MELLITUS, TYPE II  . ANXIETY  . DEPRESSION  . HYPERTENSION  . BRONCHITIS, CHRONIC  . GERD  . PEPTIC ULCER DISEASE  . OVERACTIVE BLADDER  . ARTHRITIS  . LOW BACK PAIN  . NAUSEA AND VOMITING  . CATARACT, HX OF    PT - End of Session Activity Tolerance: Patient limited by fatigue PT Plan of Care Consulted and Agree with Plan of Care: Patient;Family member/caregiver Family Member Consulted: Veronica Key (grand-daughter)  GP No functional reporting required  Veronica Key 01/03/2012, 2:49 PM

## 2012-01-05 ENCOUNTER — Telehealth (HOSPITAL_COMMUNITY): Payer: Self-pay

## 2012-01-06 ENCOUNTER — Ambulatory Visit (HOSPITAL_COMMUNITY): Payer: Medicare Other | Admitting: Physical Therapy

## 2012-01-11 ENCOUNTER — Ambulatory Visit (HOSPITAL_COMMUNITY): Payer: Medicare Other

## 2012-01-12 ENCOUNTER — Ambulatory Visit (HOSPITAL_COMMUNITY): Payer: Medicare Other | Admitting: Physical Therapy

## 2012-01-17 ENCOUNTER — Ambulatory Visit (HOSPITAL_COMMUNITY): Payer: Medicare Other | Admitting: Physical Therapy

## 2012-01-19 ENCOUNTER — Ambulatory Visit (HOSPITAL_COMMUNITY): Payer: Medicare Other | Admitting: Physical Therapy

## 2012-01-24 ENCOUNTER — Ambulatory Visit (HOSPITAL_COMMUNITY): Payer: Medicare Other | Admitting: Physical Therapy

## 2012-01-26 ENCOUNTER — Ambulatory Visit (HOSPITAL_COMMUNITY): Payer: Medicare Other | Admitting: Physical Therapy

## 2012-01-28 ENCOUNTER — Ambulatory Visit (HOSPITAL_COMMUNITY): Payer: Medicare Other | Admitting: Physical Therapy

## 2012-11-23 ENCOUNTER — Emergency Department (HOSPITAL_COMMUNITY)
Admission: EM | Admit: 2012-11-23 | Discharge: 2012-11-24 | Disposition: A | Discharge status: Home / Self Care | Payer: Medicare Other | Attending: Emergency Medicine | Admitting: Emergency Medicine

## 2012-11-23 ENCOUNTER — Encounter (HOSPITAL_COMMUNITY): Payer: Self-pay | Admitting: Emergency Medicine

## 2012-11-23 DIAGNOSIS — J4489 Other specified chronic obstructive pulmonary disease: Secondary | ICD-10-CM | POA: Insufficient documentation

## 2012-11-23 DIAGNOSIS — I10 Essential (primary) hypertension: Secondary | ICD-10-CM | POA: Insufficient documentation

## 2012-11-23 DIAGNOSIS — M79609 Pain in unspecified limb: Secondary | ICD-10-CM | POA: Insufficient documentation

## 2012-11-23 DIAGNOSIS — M25579 Pain in unspecified ankle and joints of unspecified foot: Secondary | ICD-10-CM | POA: Insufficient documentation

## 2012-11-23 DIAGNOSIS — E876 Hypokalemia: Secondary | ICD-10-CM | POA: Insufficient documentation

## 2012-11-23 DIAGNOSIS — J449 Chronic obstructive pulmonary disease, unspecified: Secondary | ICD-10-CM | POA: Insufficient documentation

## 2012-11-23 DIAGNOSIS — R252 Cramp and spasm: Secondary | ICD-10-CM

## 2012-11-23 HISTORY — DX: Chronic obstructive pulmonary disease, unspecified: J44.9

## 2012-11-23 HISTORY — DX: Essential (primary) hypertension: I10

## 2012-11-23 NOTE — ED Notes (Signed)
Pt c/o lower leg cramp.

## 2012-11-24 LAB — CBC WITH DIFFERENTIAL/PLATELET
Basophils Absolute: 0.1 10*3/uL (ref 0.0–0.1)
Basophils Relative: 1 % (ref 0–1)
Eosinophils Absolute: 0.5 10*3/uL (ref 0.0–0.7)
Eosinophils Relative: 6 % — ABNORMAL HIGH (ref 0–5)
Lymphs Abs: 2.6 10*3/uL (ref 0.7–4.0)
MCH: 30.3 pg (ref 26.0–34.0)
MCV: 87.1 fL (ref 78.0–100.0)
Neutrophils Relative %: 52 % (ref 43–77)
Platelets: 240 10*3/uL (ref 150–400)
RBC: 4.26 MIL/uL (ref 3.87–5.11)
RDW: 13.9 % (ref 11.5–15.5)

## 2012-11-24 LAB — COMPREHENSIVE METABOLIC PANEL
ALT: 9 U/L (ref 0–35)
AST: 12 U/L (ref 0–37)
Albumin: 3.9 g/dL (ref 3.5–5.2)
Alkaline Phosphatase: 85 U/L (ref 39–117)
Calcium: 9.7 mg/dL (ref 8.4–10.5)
GFR calc Af Amer: >90 mL/min (ref >90)
Glucose, Bld: 108 mg/dL — ABNORMAL HIGH (ref 70–99)
Potassium: 3.2 mEq/L — ABNORMAL LOW (ref 3.5–5.1)
Sodium: 131 mEq/L — ABNORMAL LOW (ref 135–145)
Total Protein: 6.5 g/dL (ref 6.0–8.3)

## 2012-11-24 MED ORDER — POTASSIUM CHLORIDE ER 10 MEQ PO TBCR: 10.0000 meq | EXTENDED_RELEASE_TABLET | Freq: Two times a day (BID) | ORAL | Status: DC

## 2012-11-24 NOTE — ED Provider Notes (Signed)
History     CSN: [id number]  Arrival date & time 11/23/12  2257   First MD Initiated Contact with Patient 11/24/12 0047      Chief Complaint  Patient presents with  . Leg Pain    (Consider location/radiation/quality/duration/timing/severity/associated sxs/prior treatment) HPI Comments: Patient presents with complaints of pain in the right calf and ankle.  She reports this as a crampy pain and states that her toes and foot feels numb.  No injury or trauma.  She does have deviation of the ankle joint due arthritis.  She has also had bilateral knee replacements.  This happened to her once before when her potassium was low.  Patient is a 72 y.o. female presenting with leg pain. The history is provided by the patient.  Leg Pain Lower extremity pain location: calf and ankle. Time since incident:  4 hours Injury: no   Pain details:    Quality:  Cramping   Radiates to:  Does not radiate   Severity:  Moderate   Onset quality:  Sudden   Timing:  Constant   Progression:  Unchanged Chronicity:  New Prior injury to area:  No Relieved by:  Rest Worsened by:  Activity and bearing weight Ineffective treatments:  None tried   Past Medical History  Diagnosis Date  . COPD (chronic obstructive pulmonary disease)   . Hypertension     Past Surgical History  Procedure Laterality Date  . Knee surgery      History reviewed. No pertinent family history.  History  Substance Use Topics  . Smoking status: Not on file  . Smokeless tobacco: Not on file  . Alcohol Use: No    OB History   Grav Para Term Preterm Abortions TAB SAB Ect Mult Living                  Review of Systems  All other systems reviewed and are negative.    Allergies  Cephalexin  Home Medications  No current outpatient prescriptions on file.  BP 176/60  Temp(Src) 97.6 F (36.4 C) (Oral)  Resp 24  SpO2 98%  Physical Exam  Nursing note and vitals reviewed. Constitutional: She is oriented to person,  place, and time. She appears well-developed and well-nourished. No distress.  HENT:  Head: Normocephalic and atraumatic.  Mouth/Throat: Oropharynx is clear and moist.  Neck: Normal range of motion. Neck supple.  Cardiovascular: Normal rate and regular rhythm.   No murmur heard. Pulmonary/Chest: Effort normal and breath sounds normal. No respiratory distress.  Abdominal: Soft. Bowel sounds are normal. She exhibits no distension. There is no tenderness.  Musculoskeletal: Normal range of motion.  There is deformity / lateral deviation of the left ankle due to arthritis.  There is no swelling of the left ankle or calf.  There are no palpable cords and Homan sign is absent.  Neurological: She is alert and oriented to person, place, and time.  Skin: Skin is warm and dry. She is not diaphoretic.    ED Course  Procedures (including critical care time)  Labs Reviewed  CBC WITH DIFFERENTIAL - Abnormal; Notable for the following:    Eosinophils Relative 6 (*)    All other components within normal limits  COMPREHENSIVE METABOLIC PANEL - Abnormal; Notable for the following:    Sodium 131 (*)    Potassium 3.2 (*)    Glucose, Bld 108 (*)    All other components within normal limits   No results found.   No diagnosis found.  MDM  The workup reveals a potassium of 3.2 which may well be the cause of her symptoms.  I doubt this is a dvt.  I will prescribe potassium replacement, follow up prn if she is not improving.        Geoffery Lyons, MD 11/24/12 0201

## 2013-02-22 ENCOUNTER — Encounter (HOSPITAL_COMMUNITY): Payer: Self-pay | Admitting: Emergency Medicine

## 2013-02-22 ENCOUNTER — Emergency Department (HOSPITAL_COMMUNITY)
Admission: EM | Admit: 2013-02-22 | Discharge: 2013-02-22 | Disposition: A | Discharge status: Home / Self Care | Payer: Medicare Other | Attending: Emergency Medicine | Admitting: Emergency Medicine

## 2013-02-22 DIAGNOSIS — I10 Essential (primary) hypertension: Secondary | ICD-10-CM | POA: Insufficient documentation

## 2013-02-22 DIAGNOSIS — N390 Urinary tract infection, site not specified: Secondary | ICD-10-CM

## 2013-02-22 DIAGNOSIS — J449 Chronic obstructive pulmonary disease, unspecified: Secondary | ICD-10-CM | POA: Insufficient documentation

## 2013-02-22 DIAGNOSIS — J4489 Other specified chronic obstructive pulmonary disease: Secondary | ICD-10-CM | POA: Insufficient documentation

## 2013-02-22 DIAGNOSIS — M6281 Muscle weakness (generalized): Secondary | ICD-10-CM | POA: Insufficient documentation

## 2013-02-22 DIAGNOSIS — Z79899 Other long term (current) drug therapy: Secondary | ICD-10-CM | POA: Insufficient documentation

## 2013-02-22 LAB — CBC WITH DIFFERENTIAL/PLATELET
Basophils Absolute: 0 10*3/uL (ref 0.0–0.1)
Eosinophils Absolute: 0.5 10*3/uL (ref 0.0–0.7)
Eosinophils Relative: 5 % (ref 0–5)
HCT: 37.8 % (ref 36.0–46.0)
Lymphocytes Relative: 14 % (ref 12–46)
MCH: 30.2 pg (ref 26.0–34.0)
MCHC: 32.8 g/dL (ref 30.0–36.0)
MCV: 92 fL (ref 78.0–100.0)
Monocytes Absolute: 0.5 10*3/uL (ref 0.1–1.0)
Platelets: 248 10*3/uL (ref 150–400)
RDW: 13.9 % (ref 11.5–15.5)
WBC: 9.9 10*3/uL (ref 4.0–10.5)

## 2013-02-22 LAB — URINALYSIS, ROUTINE W REFLEX MICROSCOPIC
Glucose, UA: NEGATIVE mg/dL
Hgb urine dipstick: NEGATIVE
Ketones, ur: NEGATIVE mg/dL
Protein, ur: NEGATIVE mg/dL
Urobilinogen, UA: 0.2 mg/dL (ref 0.0–1.0)

## 2013-02-22 LAB — BASIC METABOLIC PANEL
CO2: 26 mEq/L (ref 19–32)
Calcium: 9.3 mg/dL (ref 8.4–10.5)
Creatinine, Ser: 0.63 mg/dL (ref 0.50–1.10)
GFR calc non Af Amer: 89 mL/min — ABNORMAL LOW (ref >90)

## 2013-02-22 LAB — URINE MICROSCOPIC-ADD ON

## 2013-02-22 MED ORDER — NITROFURANTOIN MONOHYD MACRO 100 MG PO CAPS: 100.0000 mg | ORAL_CAPSULE | Freq: Two times a day (BID) | ORAL | Status: DC

## 2013-02-22 MED ORDER — SODIUM CHLORIDE 0.9 % IV BOLUS (SEPSIS)
1000.0000 mL | Freq: Once | INTRAVENOUS | Status: AC
  Administered 2013-02-22: 1000 mL via INTRAVENOUS

## 2013-02-22 MED ORDER — NITROFURANTOIN MACROCRYSTAL 100 MG PO CAPS
100.0000 mg | ORAL_CAPSULE | Freq: Once | ORAL | Status: AC
  Administered 2013-02-22: 100 mg via ORAL
  Filled 2013-02-22: qty 1

## 2013-02-22 NOTE — ED Provider Notes (Signed)
CSN: [id number]     Arrival date & time 02/22/13  1521 History     First MD Initiated Contact with Patient 02/22/13 1529     Chief Complaint  Patient presents with  . Weakness  . Diarrhea   (Consider location/radiation/quality/duration/timing/severity/associated sxs/prior Treatment) HPI Comments: 72 year old female presenting after a near syncopal episode while she was having a bowel movement. She denies true syncope. Been having some diarrhea today, which her caregiver described as green.  Patient is a 72 y.o. female presenting with weakness and diarrhea.  Weakness This is a new problem. The current episode started more than 1 week ago. The problem occurs constantly. The problem has been gradually worsening. Pertinent negatives include no chest pain, no abdominal pain and no shortness of breath. Associated symptoms comments: Diarrhea, no vomiting. Nothing aggravates the symptoms. Nothing relieves the symptoms. She has tried nothing for the symptoms.  Diarrhea Associated symptoms: no abdominal pain, no fever and no vomiting     Past Medical History  Diagnosis Date  . COPD (chronic obstructive pulmonary disease)   . Hypertension    Past Surgical History  Procedure Laterality Date  . Knee surgery     History reviewed. No pertinent family history. History  Substance Use Topics  . Smoking status: Not on file  . Smokeless tobacco: Not on file  . Alcohol Use: No   OB History   Grav Para Term Preterm Abortions TAB SAB Ect Mult Living                 Review of Systems  Constitutional: Negative for fever.  HENT: Negative for congestion.   Respiratory: Negative for cough and shortness of breath.   Cardiovascular: Negative for chest pain.  Gastrointestinal: Positive for diarrhea. Negative for nausea, vomiting and abdominal pain.  Neurological: Positive for weakness.  All other systems reviewed and are negative.    Allergies  Cephalexin  Home Medications   Current  Outpatient Rx  Name  Route  Sig  Dispense  Refill  . potassium chloride (K-DUR) 10 MEQ tablet   Oral   Take 1 tablet (10 mEq total) by mouth 2 (two) times daily.   15 tablet   0    There were no vitals taken for this visit. Physical Exam  Nursing note and vitals reviewed. Constitutional: She is oriented to person, place, and time. She appears well-developed and well-nourished. No distress.  HENT:  Head: Normocephalic and atraumatic.  Mouth/Throat: Oropharynx is clear and moist.  Eyes: Conjunctivae are normal. Pupils are equal, round, and reactive to light. No scleral icterus.  Neck: Neck supple.  Cardiovascular: Normal rate, regular rhythm, normal heart sounds and intact distal pulses.   No murmur heard. Pulmonary/Chest: Effort normal and breath sounds normal. No stridor. No respiratory distress. She has no rales.  Abdominal: Soft. Bowel sounds are normal. She exhibits no distension. There is no tenderness. There is no rebound and no guarding.  Genitourinary: Rectal exam shows no tenderness and anal tone normal. Guaiac negative stool.  Musculoskeletal: Normal range of motion.  Neurological: She is alert and oriented to person, place, and time.  Skin: Skin is warm and dry. No rash noted.  Psychiatric: She has a normal mood and affect. Her behavior is normal.    ED Course   Procedures (including critical care time)  Labs Reviewed  BASIC METABOLIC PANEL - Abnormal; Notable for the following:    Sodium 132 (*)    Glucose, Bld 112 (*)    GFR  calc non Af Amer 89 (*)    All other components within normal limits  URINALYSIS, ROUTINE W REFLEX MICROSCOPIC - Abnormal; Notable for the following:    APPearance CLOUDY (*)    Specific Gravity, Urine <1.005 (*)    Nitrite POSITIVE (*)    Leukocytes, UA SMALL (*)    All other components within normal limits  URINE MICROSCOPIC-ADD ON - Abnormal; Notable for the following:    Bacteria, UA MANY (*)    All other components within normal  limits  URINE CULTURE  CBC WITH DIFFERENTIAL   No results found.  EKG - sinus brady, rate 51, normal axis, PR 212, normal conduction, no ST/T changes, no priors available.    1. UTI (urinary tract infection)     MDM  72 yo female with weakness, near syncope during bowel movement.  Well appearing, nontoxic, NAD.  Workup reveals UTI.  On review of her prior urine culture data, antibiotic choice very limited.  Showed E coli resistant to cipro, bactrim.  She has allergy to cephalexin.  Will resort to using nitrofurantoin.  She has good renal function, but she will need close PCP followup.   Candyce Churn, MD 02/22/13 [phone]

## 2013-02-22 NOTE — ED Notes (Addendum)
Pt comes from home via EMS with c/o weakness and diarrhea which began earlier today. Pt states in a chair when she began feeling weak and the diarrhea started shortly after. Pt denies nausea, vomiting, abdominal and chest pain. Pt states "I got dizzy and felt like I was going to pass out". Pt states symptoms have improved since the 2 episodes of diarrhea but states "I still don't feel right".

## 2013-02-24 LAB — URINE CULTURE: Colony Count: >=100000

## 2013-02-25 ENCOUNTER — Telehealth (HOSPITAL_COMMUNITY): Payer: Self-pay | Admitting: Emergency Medicine

## 2013-02-25 NOTE — ED Notes (Signed)
Post ED Visit - Positive Culture Follow-up  Culture report reviewed by antimicrobial stewardship pharmacist: []  Wes Dulaney, Pharm.D., BCPS []  Celedonio Miyamoto, Pharm.D., BCPS []  Georgina Pillion, Pharm.D., BCPS []  [place], [address].D., BCPS, AAHIVP []  Estella Husk, Pharm.D., BCPS, AAHIVP [x]  Okey Regal, Pharm.D  Positive urine culture Treated with Macrobid, organism sensitive to the same and no further patient follow-up is required at this time.  Kylie A Holland 02/25/2013, 5:16 PM

## 2013-07-12 ENCOUNTER — Other Ambulatory Visit (HOSPITAL_COMMUNITY): Payer: Self-pay | Admitting: Nurse Practitioner

## 2013-07-12 ENCOUNTER — Other Ambulatory Visit: Payer: Self-pay | Admitting: Neurology

## 2013-07-12 ENCOUNTER — Ambulatory Visit (HOSPITAL_COMMUNITY)
Admission: RE | Admit: 2013-07-12 | Discharge: 2013-07-12 | Disposition: A | Discharge status: Home / Self Care | Payer: Medicare Other | Source: Ambulatory Visit | Attending: Nurse Practitioner | Admitting: Nurse Practitioner

## 2013-07-12 DIAGNOSIS — M5137 Other intervertebral disc degeneration, lumbosacral region: Secondary | ICD-10-CM | POA: Insufficient documentation

## 2013-07-12 DIAGNOSIS — M545 Low back pain, unspecified: Secondary | ICD-10-CM | POA: Insufficient documentation

## 2013-07-12 DIAGNOSIS — M51379 Other intervertebral disc degeneration, lumbosacral region without mention of lumbar back pain or lower extremity pain: Secondary | ICD-10-CM | POA: Insufficient documentation

## 2013-08-08 ENCOUNTER — Other Ambulatory Visit (HOSPITAL_COMMUNITY): Payer: Self-pay | Admitting: Internal Medicine

## 2013-08-08 DIAGNOSIS — R1031 Right lower quadrant pain: Secondary | ICD-10-CM

## 2013-08-10 ENCOUNTER — Ambulatory Visit (HOSPITAL_COMMUNITY)
Admission: RE | Admit: 2013-08-10 | Discharge: 2013-08-10 | Disposition: A | Discharge status: Home / Self Care | Payer: Medicare Other | Source: Ambulatory Visit | Attending: Internal Medicine | Admitting: Internal Medicine

## 2013-08-10 DIAGNOSIS — R937 Abnormal findings on diagnostic imaging of other parts of musculoskeletal system: Secondary | ICD-10-CM | POA: Insufficient documentation

## 2013-08-10 DIAGNOSIS — R1031 Right lower quadrant pain: Secondary | ICD-10-CM | POA: Insufficient documentation

## 2013-08-10 DIAGNOSIS — M259 Joint disorder, unspecified: Secondary | ICD-10-CM | POA: Insufficient documentation

## 2013-08-10 LAB — POCT I-STAT CREATININE: CREATININE: 0.7 mg/dL (ref 0.50–1.10)

## 2013-08-10 MED ORDER — IOHEXOL 300 MG/ML  SOLN
100.0000 mL | Freq: Once | INTRAMUSCULAR | Status: AC | PRN
  Administered 2013-08-10: 100 mL via INTRAVENOUS

## 2013-08-20 ENCOUNTER — Other Ambulatory Visit (HOSPITAL_COMMUNITY): Payer: Self-pay | Admitting: Family Medicine

## 2013-08-20 DIAGNOSIS — R935 Abnormal findings on diagnostic imaging of other abdominal regions, including retroperitoneum: Secondary | ICD-10-CM

## 2013-08-24 ENCOUNTER — Encounter (HOSPITAL_COMMUNITY): Payer: Self-pay

## 2013-08-24 ENCOUNTER — Ambulatory Visit (HOSPITAL_COMMUNITY)
Admission: RE | Admit: 2013-08-24 | Discharge: 2013-08-24 | Disposition: A | Discharge status: Home / Self Care | Payer: Medicare Other | Source: Ambulatory Visit | Attending: Family Medicine | Admitting: Family Medicine

## 2013-08-24 DIAGNOSIS — M5137 Other intervertebral disc degeneration, lumbosacral region: Secondary | ICD-10-CM | POA: Insufficient documentation

## 2013-08-24 DIAGNOSIS — R935 Abnormal findings on diagnostic imaging of other abdominal regions, including retroperitoneum: Secondary | ICD-10-CM

## 2013-08-24 DIAGNOSIS — M545 Low back pain, unspecified: Secondary | ICD-10-CM | POA: Insufficient documentation

## 2013-08-24 DIAGNOSIS — M5126 Other intervertebral disc displacement, lumbar region: Secondary | ICD-10-CM | POA: Insufficient documentation

## 2013-08-24 DIAGNOSIS — M25559 Pain in unspecified hip: Secondary | ICD-10-CM | POA: Insufficient documentation

## 2013-08-24 DIAGNOSIS — M51379 Other intervertebral disc degeneration, lumbosacral region without mention of lumbar back pain or lower extremity pain: Secondary | ICD-10-CM | POA: Insufficient documentation

## 2013-09-21 ENCOUNTER — Other Ambulatory Visit: Payer: Self-pay | Admitting: Neurological Surgery

## 2013-09-24 ENCOUNTER — Encounter (HOSPITAL_COMMUNITY): Payer: Self-pay | Admitting: Pharmacy Technician

## 2013-09-27 NOTE — Pre-Procedure Instructions (Signed)
Veronica Key  09/27/2013   Your procedure is scheduled on:  Tues, Mar 10 @ 7:30 AM  [facility]  at 5:30 AM.  Call this number if you have problems the morning of surgery: [phone]   Remember:   Do not eat food or drink liquids after midnight.   Take these medicines the morning of surgery with A SIP OF WATER: Albuterol<Bring Your Inhaler With You>,Amlodipine(Norvasc),Valium(Diazepam),Cymbalta(Duloxetine),Pain Pill(if needed),Labetalol(Normodyne),Ditropan(Oxybutynin),Pantoprazole(Protonix),and Tramadol(Ultram)              No Goody's,BC's,Aleve,Aspirin,Ibuprofen,Fish Oil,or any Herbal Medications    Do not wear jewelry, make-up or nail polish.  Do not wear lotions, powders, or perfumes. You may wear deodorant.  Do not shave 48 hours prior to surgery.   Do not bring valuables to the hospital.  [facility]                  for any belongings or valuables.               Contacts, dentures or bridgework may not be worn into surgery.  Leave suitcase in the car. After surgery it may be brought to your room.  For patients admitted to the hospital, discharge time is determined by your                treatment team.               Patients discharged the day of surgery will not be allowed to drive  home.    Special Instructions:  [place] - Preparing for Surgery  Before surgery, you can play an important role.  Because skin is not sterile, your skin needs to be as free of germs as possible.  You can reduce the number of germs on you skin by washing with CHG (chlorahexidine gluconate) soap before surgery.  CHG is an antiseptic cleaner which kills germs and bonds with the skin to continue killing germs even after washing.  Please DO NOT use if you have an allergy to CHG or antibacterial soaps.  If your skin becomes reddened/irritated stop using the CHG and inform your nurse when you arrive at Short Stay.  Do not shave (including legs and  underarms) for at least 48 hours prior to the first CHG shower.  You may shave your face.  Please follow these instructions carefully:   1.  Shower with CHG Soap the night before surgery and the                                morning of Surgery.  2.  If you choose to wash your hair, wash your hair first as usual with your       normal shampoo.  3.  After you shampoo, rinse your hair and body thoroughly to remove the                      Shampoo.  4.  Use CHG as you would any other liquid soap.  You can apply chg directly       to the skin and wash gently with scrungie or a clean washcloth.  5.  Apply the CHG Soap to your body ONLY FROM THE NECK DOWN.        Do not use on open wounds or open sores.  Avoid contact with your eyes,  ears, mouth and genitals (private parts).  Wash genitals (private parts)       with your normal soap.  6.  Wash thoroughly, paying special attention to the area where your surgery        will be performed.  7.  Thoroughly rinse your body with warm water from the neck down.  8.  DO NOT shower/wash with your normal soap after using and rinsing off       the CHG Soap.  9.  Pat yourself dry with a clean towel.            10.  Wear clean pajamas.            11.  Place clean sheets on your bed the night of your first shower and do not        sleep with pets.  Day of Surgery  Do not apply any lotions/deoderants the morning of surgery.  Please wear clean clothes to the hospital/surgery center.     Please read over the following fact sheets that you were given: Pain Booklet, Coughing and Deep Breathing and Surgical Site Infection Prevention

## 2013-09-28 ENCOUNTER — Encounter (HOSPITAL_COMMUNITY)
Admission: RE | Admit: 2013-09-28 | Discharge: 2013-09-28 | Disposition: A | Discharge status: Home / Self Care | Payer: Medicare Other | Source: Ambulatory Visit | Attending: Anesthesiology | Admitting: Anesthesiology

## 2013-09-28 ENCOUNTER — Encounter (HOSPITAL_COMMUNITY): Payer: Self-pay

## 2013-09-28 ENCOUNTER — Encounter (HOSPITAL_COMMUNITY)
Admission: RE | Admit: 2013-09-28 | Discharge: 2013-09-28 | Disposition: A | Discharge status: Home / Self Care | Payer: Medicare Other | Source: Ambulatory Visit | Attending: Neurological Surgery | Admitting: Neurological Surgery

## 2013-09-28 DIAGNOSIS — Z01818 Encounter for other preprocedural examination: Secondary | ICD-10-CM | POA: Insufficient documentation

## 2013-09-28 DIAGNOSIS — Z01812 Encounter for preprocedural laboratory examination: Secondary | ICD-10-CM | POA: Insufficient documentation

## 2013-09-28 HISTORY — DX: Pain in unspecified joint: M25.50

## 2013-09-28 HISTORY — DX: Major depressive disorder, single episode, unspecified: F32.9

## 2013-09-28 HISTORY — DX: Pneumonia, unspecified organism: J18.9

## 2013-09-28 HISTORY — DX: Anesthesia of skin: R20.0

## 2013-09-28 HISTORY — DX: Personal history of other diseases of the respiratory system: Z87.09

## 2013-09-28 HISTORY — DX: Personal history of other diseases of the digestive system: Z87.19

## 2013-09-28 HISTORY — DX: Localized edema: R60.0

## 2013-09-28 HISTORY — DX: Carpal tunnel syndrome, unspecified upper limb: G56.00

## 2013-09-28 HISTORY — DX: Shortness of breath: R06.02

## 2013-09-28 HISTORY — DX: Depression, unspecified: F32.A

## 2013-09-28 HISTORY — DX: Gastro-esophageal reflux disease without esophagitis: K21.9

## 2013-09-28 HISTORY — DX: Personal history of urinary calculi: Z87.442

## 2013-09-28 HISTORY — DX: Edema, unspecified: R60.9

## 2013-09-28 HISTORY — DX: Personal history of peptic ulcer disease: Z87.11

## 2013-09-28 HISTORY — DX: Unspecified osteoarthritis, unspecified site: M19.90

## 2013-09-28 HISTORY — DX: Headache: R51

## 2013-09-28 HISTORY — DX: Urgency of urination: R39.15

## 2013-09-28 HISTORY — DX: Dorsalgia, unspecified: M54.9

## 2013-09-28 HISTORY — DX: Other chronic pain: G89.29

## 2013-09-28 HISTORY — DX: Frequency of micturition: R35.0

## 2013-09-28 HISTORY — DX: Anxiety disorder, unspecified: F41.9

## 2013-09-28 LAB — CBC
HCT: 37.8 % (ref 36.0–46.0)
HEMOGLOBIN: 12.9 g/dL (ref 12.0–15.0)
MCH: 30.6 pg (ref 26.0–34.0)
MCHC: 34.1 g/dL (ref 30.0–36.0)
MCV: 89.6 fL (ref 78.0–100.0)
PLATELETS: 276 10*3/uL (ref 150–400)
RBC: 4.22 MIL/uL (ref 3.87–5.11)
RDW: 14.4 % (ref 11.5–15.5)
WBC: 8.3 10*3/uL (ref 4.0–10.5)

## 2013-09-28 LAB — BASIC METABOLIC PANEL
BUN: 10 mg/dL (ref 6–23)
CO2: 23 mEq/L (ref 19–32)
Calcium: 9.7 mg/dL (ref 8.4–10.5)
Chloride: 98 mEq/L (ref 96–112)
Creatinine, Ser: 0.59 mg/dL (ref 0.50–1.10)
Glucose, Bld: 100 mg/dL — ABNORMAL HIGH (ref 70–99)
POTASSIUM: 5.2 meq/L (ref 3.7–5.3)
Sodium: 134 mEq/L — ABNORMAL LOW (ref 137–147)

## 2013-09-28 LAB — SURGICAL PCR SCREEN
MRSA, PCR: NEGATIVE
STAPHYLOCOCCUS AUREUS: POSITIVE — AB

## 2013-09-28 NOTE — Progress Notes (Addendum)
  Pt doesn't have a cardiologist  Denies ever having an echo/stress test/heart cath  Denies CXR in past yr    EKG in epic from 02-22-13  Medical Md is Dr.Knowlton and Karilyn CotaGosrani

## 2013-10-01 MED ORDER — VANCOMYCIN HCL IN DEXTROSE 1-5 GM/200ML-% IV SOLN
1000.0000 mg | INTRAVENOUS | Status: AC
Start: 2013-10-02 — End: 2013-10-02
  Administered 2013-10-02: 1000 mg via INTRAVENOUS
  Filled 2013-10-01: qty 200

## 2013-10-01 NOTE — Progress Notes (Signed)
Anesthesia chart review: Patient is a 73 year old female scheduled for L1-2 laminectomy on 10/02/2013 by Dr. Danielle DessElsner. History includes smoking, hypertension, GERD, COPD, anxiety, depression, headaches, peripheral edema, hiatal hernia, nephrolithiasis, gastric ulcer, arthritis, bilateral TKR, right reverse total shoulder '12, hysterectomy, cholecystectomy. Medical doctors were reported as Dr. Sudie BaileyKnowlton and Dr. Karilyn CotaGosrani.  EKG on 02/22/13 showed SB @ 51 bpm with first degree AVB, inferior infarct (age undetermined).  It was not felt significantly changed from prior EKG on 04/22/10.   CXR on 09/28/13 showed: The findings are consistent with COPD with no evidence of pneumonia. Minimal left basilar atelectasis is present. There is enlargement of the cardiac silhouette without overt evidence of CHF.   Preoperative labs noted.  EKG appears stable. No chest pain symptoms documented at her PAT visit.  If no acute changes then I anticipate that she can proceed as planned.  Veronica Ochsllison Shaneece Stockburger, PA-C [facility]Anesthesiology Phone [phone] 10/01/2013 9:22 AM

## 2013-10-02 ENCOUNTER — Inpatient Hospital Stay (HOSPITAL_COMMUNITY): Payer: Medicare Other

## 2013-10-02 ENCOUNTER — Encounter (HOSPITAL_COMMUNITY): Payer: Self-pay | Admitting: *Deleted

## 2013-10-02 ENCOUNTER — Inpatient Hospital Stay (HOSPITAL_COMMUNITY): Payer: Medicare Other | Admitting: Anesthesiology

## 2013-10-02 ENCOUNTER — Encounter (HOSPITAL_COMMUNITY)
Admission: RE | Disposition: A | Discharge status: Skilled Nursing Facility | Payer: Self-pay | Source: Ambulatory Visit | Attending: Neurological Surgery

## 2013-10-02 ENCOUNTER — Inpatient Hospital Stay (HOSPITAL_COMMUNITY)
Admission: RE | Admit: 2013-10-02 | Discharge: 2013-10-06 | DRG: 029 | Disposition: A | Discharge status: Skilled Nursing Facility | Payer: Medicare Other | Source: Ambulatory Visit | Attending: Neurological Surgery | Admitting: Neurological Surgery

## 2013-10-02 ENCOUNTER — Encounter (HOSPITAL_COMMUNITY): Payer: Medicare Other | Admitting: Vascular Surgery

## 2013-10-02 DIAGNOSIS — M5106 Intervertebral disc disorders with myelopathy, lumbar region: Secondary | ICD-10-CM | POA: Diagnosis present

## 2013-10-02 DIAGNOSIS — G9741 Accidental puncture or laceration of dura during a procedure: Secondary | ICD-10-CM | POA: Diagnosis not present

## 2013-10-02 DIAGNOSIS — M48061 Spinal stenosis, lumbar region without neurogenic claudication: Secondary | ICD-10-CM | POA: Diagnosis present

## 2013-10-02 DIAGNOSIS — M4120 Other idiopathic scoliosis, site unspecified: Secondary | ICD-10-CM | POA: Diagnosis present

## 2013-10-02 DIAGNOSIS — F17200 Nicotine dependence, unspecified, uncomplicated: Secondary | ICD-10-CM | POA: Diagnosis present

## 2013-10-02 DIAGNOSIS — M4716 Other spondylosis with myelopathy, lumbar region: Secondary | ICD-10-CM | POA: Diagnosis present

## 2013-10-02 DIAGNOSIS — G834 Cauda equina syndrome: Principal | ICD-10-CM | POA: Diagnosis present

## 2013-10-02 DIAGNOSIS — Z96659 Presence of unspecified artificial knee joint: Secondary | ICD-10-CM

## 2013-10-02 DIAGNOSIS — I10 Essential (primary) hypertension: Secondary | ICD-10-CM | POA: Diagnosis present

## 2013-10-02 DIAGNOSIS — J438 Other emphysema: Secondary | ICD-10-CM | POA: Diagnosis present

## 2013-10-02 HISTORY — PX: LUMBAR LAMINECTOMY/DECOMPRESSION MICRODISCECTOMY: SHX5026

## 2013-10-02 SURGERY — LUMBAR LAMINECTOMY/DECOMPRESSION MICRODISCECTOMY 1 LEVEL
Anesthesia: General | Site: Back

## 2013-10-02 MED ORDER — FENTANYL CITRATE 0.05 MG/ML IJ SOLN
INTRAMUSCULAR | Status: AC
  Filled 2013-10-02: qty 5

## 2013-10-02 MED ORDER — MIDAZOLAM HCL 5 MG/5ML IJ SOLN
INTRAMUSCULAR | Status: DC | PRN
  Administered 2013-10-02: 1 mg via INTRAVENOUS

## 2013-10-02 MED ORDER — NEOSTIGMINE METHYLSULFATE 1 MG/ML IJ SOLN
INTRAMUSCULAR | Status: DC | PRN
  Administered 2013-10-02: 3 mg via INTRAVENOUS

## 2013-10-02 MED ORDER — ONDANSETRON HCL 4 MG/2ML IJ SOLN: 4.0000 mg | Freq: Four times a day (QID) | INTRAMUSCULAR | Status: DC | PRN

## 2013-10-02 MED ORDER — PHENYLEPHRINE 40 MCG/ML (10ML) SYRINGE FOR IV PUSH (FOR BLOOD PRESSURE SUPPORT)
PREFILLED_SYRINGE | INTRAVENOUS | Status: AC
  Filled 2013-10-02: qty 10

## 2013-10-02 MED ORDER — ACETAMINOPHEN 325 MG PO TABS
650.0000 mg | ORAL_TABLET | ORAL | Status: DC | PRN
  Filled 2013-10-02: qty 2

## 2013-10-02 MED ORDER — ONDANSETRON HCL 4 MG/2ML IJ SOLN
INTRAMUSCULAR | Status: DC | PRN
  Administered 2013-10-02: 4 mg via INTRAVENOUS

## 2013-10-02 MED ORDER — ACETAMINOPHEN 650 MG RE SUPP: 650.0000 mg | RECTAL | Status: DC | PRN

## 2013-10-02 MED ORDER — ALBUTEROL SULFATE HFA 108 (90 BASE) MCG/ACT IN AERS: 2.0000 | INHALATION_SPRAY | RESPIRATORY_TRACT | Status: DC | PRN

## 2013-10-02 MED ORDER — ALUM & MAG HYDROXIDE-SIMETH 200-200-20 MG/5ML PO SUSP
30.0000 mL | Freq: Four times a day (QID) | ORAL | Status: DC | PRN
Start: 2013-10-02 — End: 2013-10-06

## 2013-10-02 MED ORDER — SODIUM CHLORIDE 0.9 % IJ SOLN
3.0000 mL | Freq: Two times a day (BID) | INTRAMUSCULAR | Status: DC
  Administered 2013-10-02 – 2013-10-06 (×7): 3 mL via INTRAVENOUS

## 2013-10-02 MED ORDER — BISACODYL 10 MG RE SUPP: 10.0000 mg | Freq: Every day | RECTAL | Status: DC | PRN

## 2013-10-02 MED ORDER — FENTANYL CITRATE 0.05 MG/ML IJ SOLN
INTRAMUSCULAR | Status: DC | PRN
  Administered 2013-10-02: 25 ug via INTRAVENOUS
  Administered 2013-10-02: 100 ug via INTRAVENOUS
  Administered 2013-10-02: 25 ug via INTRAVENOUS

## 2013-10-02 MED ORDER — AMLODIPINE BESYLATE 5 MG PO TABS
5.0000 mg | ORAL_TABLET | Freq: Every day | ORAL | Status: DC
  Administered 2013-10-03 – 2013-10-06 (×4): 5 mg via ORAL
  Filled 2013-10-02 (×4): qty 1

## 2013-10-02 MED ORDER — LIDOCAINE-EPINEPHRINE 1 %-1:100000 IJ SOLN
INTRAMUSCULAR | Status: DC | PRN
  Administered 2013-10-02: 10 mL

## 2013-10-02 MED ORDER — PROPOFOL 10 MG/ML IV BOLUS
INTRAVENOUS | Status: DC | PRN
  Administered 2013-10-02: 100 mg via INTRAVENOUS

## 2013-10-02 MED ORDER — ONDANSETRON HCL 4 MG/2ML IJ SOLN: 4.0000 mg | INTRAMUSCULAR | Status: DC | PRN

## 2013-10-02 MED ORDER — HEMOSTATIC AGENTS (NO CHARGE) OPTIME
TOPICAL | Status: DC | PRN
  Administered 2013-10-02: 1 via TOPICAL

## 2013-10-02 MED ORDER — PHENOL 1.4 % MT LIQD: 1.0000 | OROMUCOSAL | Status: DC | PRN

## 2013-10-02 MED ORDER — POLYETHYLENE GLYCOL 3350 17 G PO PACK
17.0000 g | PACK | Freq: Every day | ORAL | Status: DC | PRN
  Filled 2013-10-02: qty 1

## 2013-10-02 MED ORDER — LIDOCAINE HCL (CARDIAC) 20 MG/ML IV SOLN
INTRAVENOUS | Status: AC
  Filled 2013-10-02: qty 5

## 2013-10-02 MED ORDER — KETOROLAC TROMETHAMINE 15 MG/ML IJ SOLN
15.0000 mg | Freq: Four times a day (QID) | INTRAMUSCULAR | Status: AC
  Administered 2013-10-02 – 2013-10-03 (×5): 15 mg via INTRAVENOUS
  Filled 2013-10-02 (×4): qty 1

## 2013-10-02 MED ORDER — EPHEDRINE SULFATE 50 MG/ML IJ SOLN
INTRAMUSCULAR | Status: AC
  Filled 2013-10-02: qty 1

## 2013-10-02 MED ORDER — LABETALOL HCL 200 MG PO TABS
200.0000 mg | ORAL_TABLET | Freq: Three times a day (TID) | ORAL | Status: DC
  Administered 2013-10-02 – 2013-10-06 (×11): 200 mg via ORAL
  Filled 2013-10-02 (×14): qty 1

## 2013-10-02 MED ORDER — DEXAMETHASONE SODIUM PHOSPHATE 10 MG/ML IJ SOLN
INTRAMUSCULAR | Status: DC | PRN
  Administered 2013-10-02: 10 mg via INTRAVENOUS

## 2013-10-02 MED ORDER — OXYBUTYNIN CHLORIDE 5 MG PO TABS
10.0000 mg | ORAL_TABLET | Freq: Every day | ORAL | Status: DC
  Administered 2013-10-03 – 2013-10-06 (×4): 10 mg via ORAL
  Filled 2013-10-02 (×4): qty 2

## 2013-10-02 MED ORDER — 0.9 % SODIUM CHLORIDE (POUR BTL) OPTIME
TOPICAL | Status: DC | PRN
  Administered 2013-10-02: 1000 mL

## 2013-10-02 MED ORDER — DULOXETINE HCL 60 MG PO CPEP
60.0000 mg | ORAL_CAPSULE | Freq: Every day | ORAL | Status: DC
  Administered 2013-10-03 – 2013-10-06 (×4): 60 mg via ORAL
  Filled 2013-10-02 (×4): qty 1

## 2013-10-02 MED ORDER — ROCURONIUM BROMIDE 100 MG/10ML IV SOLN
INTRAVENOUS | Status: DC | PRN
  Administered 2013-10-02: 40 mg via INTRAVENOUS

## 2013-10-02 MED ORDER — SODIUM CHLORIDE 0.9 % IJ SOLN: 3.0000 mL | INTRAMUSCULAR | Status: DC | PRN

## 2013-10-02 MED ORDER — SENNA 8.6 MG PO TABS
1.0000 | ORAL_TABLET | Freq: Two times a day (BID) | ORAL | Status: DC
  Administered 2013-10-02 – 2013-10-06 (×6): 8.6 mg via ORAL
  Filled 2013-10-02 (×10): qty 1

## 2013-10-02 MED ORDER — OXYCODONE HCL 5 MG PO TABS: 5.0000 mg | ORAL_TABLET | Freq: Once | ORAL | Status: DC | PRN

## 2013-10-02 MED ORDER — SODIUM CHLORIDE 0.9 % IR SOLN
Status: DC | PRN
  Administered 2013-10-02: 07:00:00

## 2013-10-02 MED ORDER — METHOCARBAMOL 100 MG/ML IJ SOLN
500.0000 mg | Freq: Four times a day (QID) | INTRAMUSCULAR | Status: DC | PRN
  Filled 2013-10-02: qty 5

## 2013-10-02 MED ORDER — DIAZEPAM 5 MG PO TABS
5.0000 mg | ORAL_TABLET | Freq: Two times a day (BID) | ORAL | Status: DC
Start: 2013-10-02 — End: 2013-10-06
  Administered 2013-10-02 – 2013-10-06 (×8): 5 mg via ORAL
  Filled 2013-10-02 (×8): qty 1

## 2013-10-02 MED ORDER — SUCCINYLCHOLINE CHLORIDE 20 MG/ML IJ SOLN
INTRAMUSCULAR | Status: AC
  Filled 2013-10-02: qty 1

## 2013-10-02 MED ORDER — BENAZEPRIL HCL 20 MG PO TABS
20.0000 mg | ORAL_TABLET | Freq: Two times a day (BID) | ORAL | Status: DC
  Administered 2013-10-02 – 2013-10-06 (×8): 20 mg via ORAL
  Filled 2013-10-02 (×10): qty 1

## 2013-10-02 MED ORDER — ROCURONIUM BROMIDE 50 MG/5ML IV SOLN
INTRAVENOUS | Status: AC
  Filled 2013-10-02: qty 1

## 2013-10-02 MED ORDER — PROPOFOL 10 MG/ML IV BOLUS
INTRAVENOUS | Status: AC
  Filled 2013-10-02: qty 20

## 2013-10-02 MED ORDER — ALBUTEROL SULFATE (2.5 MG/3ML) 0.083% IN NEBU: 2.5000 mg | INHALATION_SOLUTION | Freq: Four times a day (QID) | RESPIRATORY_TRACT | Status: DC | PRN

## 2013-10-02 MED ORDER — SODIUM CHLORIDE 0.9 % IV SOLN: 250.0000 mL | INTRAVENOUS | Status: DC

## 2013-10-02 MED ORDER — MENTHOL 3 MG MT LOZG: 1.0000 | LOZENGE | OROMUCOSAL | Status: DC | PRN

## 2013-10-02 MED ORDER — TRAMADOL HCL 50 MG PO TABS
50.0000 mg | ORAL_TABLET | Freq: Three times a day (TID) | ORAL | Status: DC | PRN
  Administered 2013-10-04 – 2013-10-05 (×4): 50 mg via ORAL
  Filled 2013-10-02 (×4): qty 1

## 2013-10-02 MED ORDER — HYDROCODONE-ACETAMINOPHEN 10-325 MG PO TABS
1.0000 | ORAL_TABLET | Freq: Two times a day (BID) | ORAL | Status: DC | PRN
  Administered 2013-10-03 – 2013-10-05 (×5): 1 via ORAL
  Filled 2013-10-02 (×6): qty 1

## 2013-10-02 MED ORDER — BUPIVACAINE HCL (PF) 0.5 % IJ SOLN
INTRAMUSCULAR | Status: DC | PRN
  Administered 2013-10-02: 15 mL
  Administered 2013-10-02: 10 mL

## 2013-10-02 MED ORDER — GLYCOPYRROLATE 0.2 MG/ML IJ SOLN
INTRAMUSCULAR | Status: DC | PRN
  Administered 2013-10-02: 0.6 mg via INTRAVENOUS

## 2013-10-02 MED ORDER — KETOROLAC TROMETHAMINE 30 MG/ML IJ SOLN
INTRAMUSCULAR | Status: AC
  Filled 2013-10-02: qty 1

## 2013-10-02 MED ORDER — THROMBIN 5000 UNITS EX SOLR
CUTANEOUS | Status: DC | PRN
  Administered 2013-10-02 (×2): 5000 [IU] via TOPICAL

## 2013-10-02 MED ORDER — CEFAZOLIN SODIUM 1-5 GM-% IV SOLN
1.0000 g | Freq: Three times a day (TID) | INTRAVENOUS | Status: AC
  Administered 2013-10-02 (×2): 1 g via INTRAVENOUS
  Filled 2013-10-02 (×2): qty 50

## 2013-10-02 MED ORDER — LACTATED RINGERS IV SOLN
INTRAVENOUS | Status: DC | PRN
  Administered 2013-10-02 (×2): via INTRAVENOUS

## 2013-10-02 MED ORDER — NEOSTIGMINE METHYLSULFATE 1 MG/ML IJ SOLN
INTRAMUSCULAR | Status: AC
  Filled 2013-10-02: qty 10

## 2013-10-02 MED ORDER — LIDOCAINE HCL (CARDIAC) 20 MG/ML IV SOLN
INTRAVENOUS | Status: DC | PRN
  Administered 2013-10-02: 60 mg via INTRAVENOUS

## 2013-10-02 MED ORDER — HYDROMORPHONE HCL PF 1 MG/ML IJ SOLN: 0.2500 mg | INTRAMUSCULAR | Status: DC | PRN

## 2013-10-02 MED ORDER — MIDAZOLAM HCL 2 MG/2ML IJ SOLN
INTRAMUSCULAR | Status: AC
  Filled 2013-10-02: qty 2

## 2013-10-02 MED ORDER — THROMBIN 5000 UNITS EX SOLR
OROMUCOSAL | Status: DC | PRN
  Administered 2013-10-02: 09:00:00 via TOPICAL

## 2013-10-02 MED ORDER — OXYCODONE HCL 5 MG/5ML PO SOLN
5.0000 mg | Freq: Once | ORAL | Status: DC | PRN
Start: 2013-10-02 — End: 2013-10-02

## 2013-10-02 MED ORDER — ONDANSETRON HCL 4 MG/2ML IJ SOLN
INTRAMUSCULAR | Status: AC
  Filled 2013-10-02: qty 2

## 2013-10-02 MED ORDER — METHOCARBAMOL 500 MG PO TABS
500.0000 mg | ORAL_TABLET | Freq: Four times a day (QID) | ORAL | Status: DC | PRN
  Administered 2013-10-03 – 2013-10-06 (×7): 500 mg via ORAL
  Filled 2013-10-02 (×7): qty 1

## 2013-10-02 MED ORDER — MORPHINE SULFATE 2 MG/ML IJ SOLN
1.0000 mg | INTRAMUSCULAR | Status: DC | PRN
  Administered 2013-10-03 – 2013-10-05 (×3): 4 mg via INTRAVENOUS
  Administered 2013-10-05 – 2013-10-06 (×2): 2 mg via INTRAVENOUS
  Filled 2013-10-02: qty 1
  Filled 2013-10-02 (×3): qty 2
  Filled 2013-10-02: qty 1

## 2013-10-02 MED ORDER — DOCUSATE SODIUM 100 MG PO CAPS
100.0000 mg | ORAL_CAPSULE | Freq: Two times a day (BID) | ORAL | Status: DC
  Administered 2013-10-02 – 2013-10-06 (×6): 100 mg via ORAL
  Filled 2013-10-02 (×7): qty 1

## 2013-10-02 MED ORDER — PANTOPRAZOLE SODIUM 40 MG PO TBEC
40.0000 mg | DELAYED_RELEASE_TABLET | Freq: Every day | ORAL | Status: DC
  Administered 2013-10-03 – 2013-10-06 (×4): 40 mg via ORAL
  Filled 2013-10-02 (×3): qty 1

## 2013-10-02 SURGICAL SUPPLY — 59 items
ADH SKN CLS APL DERMABOND .7 (GAUZE/BANDAGES/DRESSINGS) ×1
BAG DECANTER FOR FLEXI CONT (MISCELLANEOUS) ×3 IMPLANT
BIT DRILL NEURO 2X3.1 SFT TUCH (MISCELLANEOUS) IMPLANT
BLADE SURG ROTATE 9660 (MISCELLANEOUS) IMPLANT
BUR ACORN 6.0 (BURR) IMPLANT
BUR ACORN 6.0MM (BURR)
BUR MATCHSTICK NEURO 3.0 LAGG (BURR) ×3 IMPLANT
CANISTER SUCT 3000ML (MISCELLANEOUS) ×3 IMPLANT
CONT SPEC 4OZ CLIKSEAL STRL BL (MISCELLANEOUS) ×3 IMPLANT
DECANTER SPIKE VIAL GLASS SM (MISCELLANEOUS) ×3 IMPLANT
DERMABOND ADVANCED (GAUZE/BANDAGES/DRESSINGS) ×2
DERMABOND ADVANCED .7 DNX12 (GAUZE/BANDAGES/DRESSINGS) ×1 IMPLANT
DRAPE LAPAROTOMY T 102X78X121 (DRAPES) ×3 IMPLANT
DRAPE MICROSCOPE LEICA (MISCELLANEOUS) ×2 IMPLANT
DRAPE POUCH INSTRU U-SHP 10X18 (DRAPES) ×3 IMPLANT
DRAPE PROXIMA HALF (DRAPES) ×2 IMPLANT
DRILL NEURO 2X3.1 SOFT TOUCH (MISCELLANEOUS) ×3
DURAPREP 26ML APPLICATOR (WOUND CARE) ×3 IMPLANT
ELECT REM PT RETURN 9FT ADLT (ELECTROSURGICAL) ×3
ELECTRODE REM PT RTRN 9FT ADLT (ELECTROSURGICAL) ×1 IMPLANT
GAUZE SPONGE 4X4 16PLY XRAY LF (GAUZE/BANDAGES/DRESSINGS) IMPLANT
GLOVE BIOGEL PI IND STRL 8.5 (GLOVE) ×1 IMPLANT
GLOVE BIOGEL PI INDICATOR 8.5 (GLOVE) ×2
GLOVE ECLIPSE 7.0 STRL STRAW (GLOVE) ×2 IMPLANT
GLOVE ECLIPSE 8.5 STRL (GLOVE) ×3 IMPLANT
GLOVE EXAM NITRILE LRG STRL (GLOVE) IMPLANT
GLOVE EXAM NITRILE MD LF STRL (GLOVE) IMPLANT
GLOVE EXAM NITRILE XL STR (GLOVE) IMPLANT
GLOVE EXAM NITRILE XS STR PU (GLOVE) IMPLANT
GLOVE INDICATOR 7.5 STRL GRN (GLOVE) ×2 IMPLANT
GLOVE SURG SS PI 7.0 STRL IVOR (GLOVE) ×3 IMPLANT
GOWN BRE IMP SLV AUR LG STRL (GOWN DISPOSABLE) IMPLANT
GOWN BRE IMP SLV AUR XL STRL (GOWN DISPOSABLE) IMPLANT
GOWN SPEC L3 XXLG W/TWL (GOWN DISPOSABLE) ×3 IMPLANT
GOWN STRL REIN 2XL LVL4 (GOWN DISPOSABLE) ×3 IMPLANT
GOWN STRL REUS W/TWL MED LVL3 (GOWN DISPOSABLE) ×2 IMPLANT
HEMOSTAT POWDER SURGIFOAM 1G (HEMOSTASIS) ×2 IMPLANT
KIT BASIN OR (CUSTOM PROCEDURE TRAY) ×3 IMPLANT
KIT ROOM TURNOVER OR (KITS) ×3 IMPLANT
NDL SPNL 20GX3.5 QUINCKE YW (NEEDLE) IMPLANT
NEEDLE HYPO 22GX1.5 SAFETY (NEEDLE) ×3 IMPLANT
NEEDLE SPNL 20GX3.5 QUINCKE YW (NEEDLE) IMPLANT
NS IRRIG 1000ML POUR BTL (IV SOLUTION) ×3 IMPLANT
PACK LAMINECTOMY NEURO (CUSTOM PROCEDURE TRAY) ×3 IMPLANT
PAD ARMBOARD 7.5X6 YLW CONV (MISCELLANEOUS) ×9 IMPLANT
PATTIES SURGICAL .5 X.5 (GAUZE/BANDAGES/DRESSINGS) ×3 IMPLANT
PATTIES SURGICAL .5 X1 (DISPOSABLE) ×3 IMPLANT
RUBBERBAND STERILE (MISCELLANEOUS) IMPLANT
SPONGE GAUZE 4X4 12PLY (GAUZE/BANDAGES/DRESSINGS) ×3 IMPLANT
SPONGE SURGIFOAM ABS GEL SZ50 (HEMOSTASIS) ×3 IMPLANT
SUT PROLENE 6 0 BV (SUTURE) ×4 IMPLANT
SUT VIC AB 1 CT1 18XBRD ANBCTR (SUTURE) ×1 IMPLANT
SUT VIC AB 1 CT1 8-18 (SUTURE) ×3
SUT VIC AB 2-0 CP2 18 (SUTURE) ×3 IMPLANT
SUT VIC AB 3-0 SH 8-18 (SUTURE) ×3 IMPLANT
SYR 20ML ECCENTRIC (SYRINGE) ×3 IMPLANT
TOWEL OR 17X24 6PK STRL BLUE (TOWEL DISPOSABLE) ×3 IMPLANT
TOWEL OR 17X26 10 PK STRL BLUE (TOWEL DISPOSABLE) ×3 IMPLANT
WATER STERILE IRR 1000ML POUR (IV SOLUTION) ×3 IMPLANT

## 2013-10-02 NOTE — Anesthesia Preprocedure Evaluation (Addendum)
Anesthesia Evaluation  Patient identified by MRN, date of birth, ID band Patient awake    Reviewed: Allergy & Precautions, H&P , NPO status , Patient's Chart, lab work & pertinent test results  Airway Mallampati: II TM Distance: >3 FB Neck ROM: full    Dental  (+) Edentulous Upper, Edentulous Lower   Pulmonary shortness of breath, COPD COPD inhaler, Current Smoker,          Cardiovascular hypertension, Pt. on medications and Pt. on home beta blockers     Neuro/Psych  Headaches, Anxiety  Neuromuscular disease    GI/Hepatic hiatal hernia, PUD, GERD-  Medicated and Controlled,  Endo/Other  diabetes, Type 2obese  Renal/GU      Musculoskeletal   Abdominal   Peds  Hematology   Anesthesia Other Findings   Reproductive/Obstetrics                          Anesthesia Physical Anesthesia Plan  ASA: III  Anesthesia Plan: General   Post-op Pain Management:    Induction: Intravenous  Airway Management Planned: Oral ETT  Additional Equipment:   Intra-op Plan:   Post-operative Plan: Extubation in OR  Informed Consent: I have reviewed the patients History and Physical, chart, labs and discussed the procedure including the risks, benefits and alternatives for the proposed anesthesia with the patient or authorized representative who has indicated his/her understanding and acceptance.   Dental advisory given  Plan Discussed with: CRNA, Anesthesiologist and Surgeon  Anesthesia Plan Comments:        Anesthesia Quick Evaluation

## 2013-10-02 NOTE — Progress Notes (Signed)
OT is recommending SNF placement for Pt. Pt and son are agreeable to SNF for Rehab. Amy, SW notified and will come to speak with Pt. Rema FendtAshley Jemiah Ellenburg, RN

## 2013-10-02 NOTE — Evaluation (Signed)
Physical Therapy Evaluation Patient Details Name: Veronica Key MRN: [id number] DOB: Apr 05, 1941 Today's Date: 10/02/2013 Time: [id number] PT Time Calculation (min): 26 min  PT Assessment / Plan / Recommendation History of Present Illness  73 y.o. s/p LUMBAR ONE-TWO LAMINECTOMY/DECOMPRESSION MICRODISCECTOMY  (N/A)  Clinical Impression  Pt requiring assist for all mobility and ADLs. Pt currently unsafe to return home and care for self. Pt at increased falls risk and decreased insight to precautions. Pt to benefit from ST-SNF to achieve safe mod I function for safe transition home alone.    PT Assessment  Patient needs continued PT services    Follow Up Recommendations  SNF;Supervision/Assistance - 24 hour    Does the patient have the potential to tolerate intense rehabilitation      Barriers to Discharge Decreased caregiver support pt alone at night and most of day    Equipment Recommendations  Rolling walker with 5" wheels    Recommendations for Other Services     Frequency Min 5X/week    Precautions / Restrictions Precautions Precautions: Back;Fall Precaution Booklet Issued: Yes (comment) Precaution Comments: reviewed precautions, pt with no recall Restrictions Weight Bearing Restrictions: No   Pertinent Vitals/Pain Pt reports surgical pain but did not rate      Mobility  Bed Mobility Overal bed mobility: Needs Assistance Bed Mobility: Rolling;Sidelying to Sit;Sit to Sidelying Rolling: Min guard Sidelying to sit: Min assist Sit to sidelying: Min assist General bed mobility comments: Cues for technique. Assistance with trunk to come to sitting. Transfers Overall transfer level: Needs assistance Equipment used: Rolling walker (2 wheeled) Transfers: Sit to/from Stand Sit to Stand: Min assist General transfer comment: v/c's to limited trunk flexion/bending Ambulation/Gait Ambulation/Gait assistance: Min guard Ambulation Distance (Feet): 20 Feet Assistive device:  Rolling walker (2 wheeled) Gait Pattern/deviations: Step-to pattern;Shuffle Gait velocity: slow Gait velocity interpretation: <1.8 ft/sec, indicative of risk for recurrent falls General Gait Details: pt extremely slow, pt with decreased R foot clearance.    Exercises     PT Diagnosis: Difficulty walking;Acute pain  PT Problem List: Decreased strength;Decreased activity tolerance;Decreased mobility;Decreased safety awareness;Decreased knowledge of use of DME;Decreased knowledge of precautions PT Treatment Interventions: DME instruction;Gait training;Functional mobility training;Therapeutic activities;Therapeutic exercise     PT Goals(Current goals can be found in the care plan section) Acute Rehab PT Goals Patient Stated Goal: go to rehab PT Goal Formulation: With patient Time For Goal Achievement: 10/09/13 Potential to Achieve Goals: Good  Visit Information  Last PT Received On: 10/02/13 Assistance Needed: +1 History of Present Illness: 73 y.o. s/p LUMBAR ONE-TWO LAMINECTOMY/DECOMPRESSION MICRODISCECTOMY  (N/A)       Prior Functioning  Home Living Family/patient expects to be discharged to:: Private residence Living Arrangements: Alone Available Help at Discharge: Personal care attendant;Other (Comment) (not with pt 24/7; stays until 3pm) Type of Home: Apartment Home Access: Level entry Home Layout: One level Home Equipment: Adaptive equipment Adaptive Equipment: Reacher Prior Function Level of Independence: Needs assistance Gait / Transfers Assistance Needed: pt uses 4wheeled RW ADL's / Homemaking Assistance Needed: caregiver assists with bathing and dressing Communication Communication: No difficulties Dominant Hand: Right    Cognition  Cognition Arousal/Alertness: Awake/alert (lethargic at beginning of session) Behavior During Therapy: WFL for tasks assessed/performed Overall Cognitive Status: Within Functional Limits for tasks assessed    Extremity/Trunk  Assessment Upper Extremity Assessment Upper Extremity Assessment: RUE deficits/detail RUE Deficits / Details: limited ROM in shoulders LUE Deficits / Details: limited ROM in shoulders Lower Extremity Assessment Lower Extremity Assessment: Generalized weakness  Balance Balance Overall balance assessment: Needs assistance Sitting-balance support: No upper extremity supported;Feet supported Sitting balance-Leahy Scale: Fair Sitting balance - Comments: sat EOB x 2 min with LOB Standing balance support: During functional activity (required to lean on bathroom sink during hand washing) Standing balance-Leahy Scale: Poor  End of Session PT - End of Session Equipment Utilized During Treatment: Gait belt Activity Tolerance: Patient limited by fatigue Patient left: in bed;with call bell/phone within reach Nurse Communication: Mobility status  GP Functional Assessment Tool Used: clinical judgment Functional Limitation: Mobility: Walking and moving around Mobility: Walking and Moving Around Current Status (Z6109): At least 40 percent but less than 60 percent impaired, limited or restricted Mobility: Walking and Moving Around Goal Status [phone]): At least 1 percent but less than 20 percent impaired, limited or restricted   Marcene Brawn 10/02/2013, 4:57 PM  Lewis Shock, PT, DPT Pager #: [phone] Office #: [phone]

## 2013-10-02 NOTE — Anesthesia Procedure Notes (Signed)
Procedure Name: Intubation Date/Time: 10/02/2013 7:41 AM Performed by: Rogelia BogaMUELLER, Altair Stanko P Pre-anesthesia Checklist: Patient identified, Emergency Drugs available, Suction available, Patient being monitored and Timeout performed Patient Re-evaluated:Patient Re-evaluated prior to inductionOxygen Delivery Method: Circle system utilized Preoxygenation: Pre-oxygenation with 100% oxygen Intubation Type: IV induction Ventilation: Mask ventilation with difficulty and Oral airway inserted - appropriate to patient size Laryngoscope Size: Mac and 4 Grade View: Grade II Tube type: Oral Tube size: 7.5 mm Number of attempts: 1 Airway Equipment and Method: Stylet Placement Confirmation: ETT inserted through vocal cords under direct vision,  positive ETCO2 and breath sounds checked- equal and bilateral Secured at: 20 cm Tube secured with: Tape Dental Injury: Teeth and Oropharynx as per pre-operative assessment

## 2013-10-02 NOTE — Clinical Social Work Placement (Addendum)
Clinical Social Work Department  CLINICAL SOCIAL WORK PLACEMENT NOTE    Patient:Jenica Jacquelin HawkingL Seger Account Number: [id number] Admit date: 10/02/13 Clinical Social Worker: Sabino Niemannmy Stuckey LCSWA Date/time: 10/02/2013 3:58 PM  Clinical Social Work is seeking post-discharge placement for this patient at the following level of care: SKILLED NURSING (*CSW will update this form in Epic as items are completed)  10/02/2013 Patient/family provided with [facility] of Clinical Social Work's list of facilities offering this level of care within the geographic area requested by the patient (or if unable, by the patient's family).  10/02/2013 Patient/family informed of their freedom to choose among providers that offer the needed level of care, that participate in Medicare, Medicaid or managed care program needed by the patient, have an available bed and are willing to accept the patient.  10/02/2013 Patient/family informed of [facility]' ownership interest in [facility], as well as of the fact that they are under no obligation to receive care at this facility.  PASARR submitted to EDS on  PASARR number received from EDS on  FL2 transmitted to all facilities in geographic area requested by pt/family on 10/02/2013 FL2 transmitted to all facilities within larger geographic area on  Patient informed that his/her managed care company has contracts with or will negotiate with certain facilities, including the following:  Patient/family informed of bed offers received:  Patient chooses bed at  Physician recommends and patient chooses bed at  [facility]- Jetta LoutBailey Morgan, LCSWA  Patient to be transferred to facility by [facility]- Jetta LoutBailey Morgan, LCSWA  The following physician request were entered in Epic:  Additional Comments:

## 2013-10-02 NOTE — Progress Notes (Signed)
Transported by Verlee Monteora , Nurse tech

## 2013-10-02 NOTE — Addendum Note (Signed)
Addendum created 10/02/13 1228 by Rogelia Bogahomas P Kohle Winner, CRNA   Modules edited: Anesthesia Flowsheet, Charges VN

## 2013-10-02 NOTE — Evaluation (Signed)
Occupational Therapy Evaluation Patient Details Name: Veronica Key MRN: [id number] DOB: 07-02-1941 Today's Date: 10/02/2013 Time: [id number] OT Time Calculation (min): 25 min  OT Assessment / Plan / Recommendation History of present illness 73 y.o. s/p LUMBAR ONE-TWO LAMINECTOMY/DECOMPRESSION MICRODISCECTOMY  (N/A)   Clinical Impression   Pt presents with below problem list. Feel pt will benefit from acute OT to increase independence prior to d/c. Recommending SNF for additional rehab prior to d/c home.    OT Assessment  Patient needs continued OT Services    Follow Up Recommendations  SNF;Supervision/Assistance - 24 hour    Barriers to Discharge Decreased caregiver support    Equipment Recommendations  Other (comment) (defer to next venue)    Recommendations for Other Services    Frequency  Min 2X/week    Precautions / Restrictions Precautions Precautions: Back;Fall (per nurse, Morrie SheldonAshley, pt okay to mobilize/had been up with staf) Precaution Booklet Issued: Yes (comment) Precaution Comments: Reviewed precautions with pt Restrictions Weight Bearing Restrictions: No   Pertinent Vitals/Pain No pain reported.     ADL  Upper Body Dressing: Moderate assistance Where Assessed - Upper Body Dressing: Unsupported sitting Lower Body Dressing: Maximal assistance Where Assessed - Lower Body Dressing: Supported sit to stand Toilet Transfer: Minimal assistance Toilet Transfer Method: Sit to Baristastand Toilet Transfer Equipment: Regular height toilet;Grab bars Toileting - Clothing Manipulation and Hygiene: Minimal assistance Where Assessed - Engineer, miningToileting Clothing Manipulation and Hygiene: Standing Tub/Shower Transfer Method: Not assessed Equipment Used: Gait belt;Rolling walker;Long-handled sponge;Reacher;Sock aid Transfers/Ambulation Related to ADLs: Min guard; Min A for transfers. ADL Comments: Educated on AE for LB ADLs. Discussed use of cup for teeth care and placement of grooming items to  help avoid bending/twisting.     OT Diagnosis: Acute pain;Generalized weakness  OT Problem List: Decreased range of motion;Decreased strength;Decreased activity tolerance;Impaired balance (sitting and/or standing);Decreased knowledge of use of DME or AE;Decreased knowledge of precautions;Impaired UE functional use OT Treatment Interventions: Self-care/ADL training;DME and/or AE instruction;Therapeutic activities;Patient/family education;Balance training   OT Goals(Current goals can be found in the care plan section) Acute Rehab OT Goals Patient Stated Goal: go to rehab OT Goal Formulation: With patient Time For Goal Achievement: 10/09/13 Potential to Achieve Goals: Good ADL Goals Pt Will Perform Grooming: with set-up;with supervision;standing Pt Will Perform Lower Body Dressing: with min assist;with adaptive equipment;sit to/from stand Pt Will Transfer to Toilet: with supervision;ambulating Pt Will Perform Toileting - Clothing Manipulation and hygiene: with supervision;sit to/from stand  Visit Information  Last OT Received On: 10/02/13 Assistance Needed: +1 History of Present Illness: 73 y.o. s/p LUMBAR ONE-TWO LAMINECTOMY/DECOMPRESSION MICRODISCECTOMY  (N/A)       Prior Functioning     Home Living Family/patient expects to be discharged to:: Private residence Living Arrangements: Alone Available Help at Discharge: Personal care attendant;Other (Comment) (not with pt 24/7; stays until 3pm) Type of Home: Apartment Home Access: Level entry Home Layout: One level Home Equipment: Adaptive equipment Adaptive Equipment: Reacher Prior Function Level of Independence: Needs assistance ADL's / Homemaking Assistance Needed: caregiver assists with bathing and dressing Communication Communication: No difficulties Dominant Hand: Right         Vision/Perception     Cognition  Cognition Arousal/Alertness: Awake/alert (lethargic at beginning of session) Behavior During Therapy:  WFL for tasks assessed/performed Overall Cognitive Status: Within Functional Limits for tasks assessed    Extremity/Trunk Assessment Upper Extremity Assessment Upper Extremity Assessment: RUE deficits/detail;LUE deficits/detail RUE Deficits / Details: limited ROM in shoulders LUE Deficits / Details: limited ROM in shoulders  Mobility Bed Mobility Overal bed mobility: Needs Assistance Bed Mobility: Rolling;Sidelying to Sit;Sit to Sidelying Rolling: Min guard Sidelying to sit: Min assist Sit to sidelying: Min assist General bed mobility comments: Cues for technique. Assistance with trunk to come to sitting. Transfers Overall transfer level: Needs assistance Equipment used: Rolling walker (2 wheeled) Transfers: Sit to/from Stand Sit to Stand: Min assist General transfer comment: cues for technique.      Exercise     Balance     End of Session OT - End of Session Equipment Utilized During Treatment: Gait belt;Rolling walker Activity Tolerance: Patient tolerated treatment well Patient left: in bed;with call bell/phone within reach Nurse Communication: Other (comment) (pt back in bed)  GO Functional Assessment Tool Used: clinical judgment Functional Limitation: Self care Self Care Current Status (Z6109): At least 40 percent but less than 60 percent impaired, limited or restricted Self Care Goal Status (U0454): At least 1 percent but less than 20 percent impaired, limited or restricted   Earlie Raveling OTR/L [phone] 10/02/2013, 3:27 PM

## 2013-10-02 NOTE — Clinical Social Work Psychosocial (Signed)
Clinical Social Work Department  BRIEF PSYCHOSOCIAL ASSESSMENT  Patient:Veronica Key Account Number: [id number]   Admit date: 10/02/13 Clinical Social Worker Rhea Pink, MSW Date/Time: 10/02/2013 3:52 PM Referred by: Physician Date Referred:  Referred for   SNF Placement   Other Referral:  Interview type: Patient  Other interview type: PSYCHOSOCIAL DATA  Living Status: Alone with a caregiver Admitted from facility:  Level of care:  Primary support name: Hanudley,Billy  Primary support relationship to patient: Son Degree of support available:  Strong and vested  CURRENT CONCERNS  Current Concerns   Post-Acute Placement   Other Concerns:  SOCIAL WORK ASSESSMENT / PLAN  CSW met with pt re: PT recommendation for SNF.   Pt lives alone with a caregiver   CSW explained placement process and answered questions.   Pt reports [facility] as [facility]    CSW completed FL2 and initiated SNF search.     Assessment/plan status: Information/Referral to [employer]  Other assessment/ plan:  Information/referral to community resources:  SNF   PTAR  PATIENT'S/FAMILY'S RESPONSE TO PLAN OF CARE:  Pt  reports she is agreeable to ST SNF in order to increase strength and independence with mobility prior to returning home  PT. Was somewhat sleep but stated, "I don't want to go but I know I have to", CSW listened and offered support. Pt verbalized understanding of placement process and appreciation for CSW assist.   Rhea Pink, MSW, [place]

## 2013-10-02 NOTE — Anesthesia Postprocedure Evaluation (Signed)
Anesthesia Post Note  Patient: Veronica Key  Procedure(s) Performed: Procedure(s) (LRB): LUMBAR ONE-TWO LAMINECTOMY/DECOMPRESSION MICRODISCECTOMY  (N/A)  Anesthesia type: General  Patient location: PACU  Post pain: Pain level controlled and Adequate analgesia  Post assessment: Post-op Vital signs reviewed, Patient's Cardiovascular Status Stable, Respiratory Function Stable, Patent Airway and Pain level controlled  Last Vitals:  Filed Vitals:   10/02/13 1130  BP:   Pulse: 64  Temp:   Resp: 21    Post vital signs: Reviewed and stable  Level of consciousness: awake, alert  and oriented  Complications: No apparent anesthesia complications

## 2013-10-02 NOTE — Preoperative (Signed)
Beta Blockers   Reason not to administer Beta Blockers:Not Applicable, Pt took Metoprolol at 0430 today 

## 2013-10-02 NOTE — H&P (Signed)
CHIEF COMPLAINT:                                          Difficulty walking and severe back pain.  HISTORY OF PRESENT ILLNESS:                     Veronica Key is a 73 year old right-handed individual who is disabled.  She is referred via the courtesy of Dr. John Giovanni and notes that she has been having problems with chronic severe and low back pain, abdominal pain that has been going on for at least six months time perhaps longer.  She is using a easygo to get around, but she notes that she does walk some in her house with the use of a walker.  She is with her caretaker who spends most of the days with her and notes that she can get around in the house, but in a very limited fashion.  An MRI of the lumbar spine was completed and the study was reviewed in the office today.  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  I demonstrated these findings on the MRI to her.  No plain radiographs accompanying her studies today although note is made of some scoliosis in the lumbar spine.  To further her workup today, I obtained an AP and lateral radiographs of the lumbar spine which demonstrates that overall she has mild scoliosis.  There is good alignment of the L1 and L2 vertebrae where she has a worst area of her stenosis.  There is no gross instability on her lumbar spine.  REVIEW OF SYSTEMS:                                    Notable for abdominal pain, incontinence, back pain, arm pain, joint pain, swelling, arthritis, and neck pain, emphysema, swelling in the feet, high blood pressure, difficulty with memory, and excessive thirst in urination on a 14-point review sheet.  PAST MEDICAL HISTORY:    Current Medical Conditions:  Reveals that she has some hypertension.  She has emphysema and COPD.    Prior Operations:  She has had shoulder surgeries  between 2012 and 2013 three separate times.  She has had knee replacement surgery in 2010 by Dr. Delbert Harness.    Medications and Allergies:  SHE NOTES AN ALLERGY TO KEFLEX.  Current medications, she is taking some Biofreeze, Poly-Iron 150 mg, labetalol, meclizine, Cymbalta, Protonix, Ditropan, Lotensin, diazepam, Norvasc, Celebrex, Ultram, Norco, and Ambien in addition to albuterol sulfate.  SOCIAL HISTORY:                                            Personal history reveals that she smokes half pack of cigarettes a day and she has done that for 35 years.  She does not drink alcohol.  PHYSICAL EXAMINATION:                                Height  and weight have been stable, 4 feet 11 inches and 165 pounds.  On physical examination, she has a great deal of difficulty standing unaided.  She has a 10-15 degree forward stoop when she stands.  Her foot balls are weak and demonstrating marked weakness in her iliopsoas, her quadriceps, her tibialis anterior, and gastrocs.  All of her reflexes are suppressed and absent.  She cannot stand unattended.  Inspection of her back reveals relatively straight back in the mid and lower lumbar spine.  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  She has cord compression at this level just at the level of her conus.  I indicated to the patient that given the fact that her scoliosis is fairly mild, she should do well with the decompression of the lumbar spine.  I do not believe that we need to go on to do a fusion at this level.  She does; however, need a laminectomy.  This would require perhaps two to three day hospital stay and thereafter she would require substantial recuperation to see if she can rebuild her strength. Whether she can tolerate the surgery from point of view of her COPD, I would like to have addressed by Dr. Sudie BaileyKnowlton.  We will plan on scheduling the surgery a couple of weeks down the road.   I did have a phone conversation with her son to indicate the fact that she would require surgical decompression of her lower lumbar spine and there are attended risks of this including the possibility of spinal fluid leakage, risk of paralysis, and also risk of any pulmonary worsening.  These things not withstanding, I believe that surgery is an appropriate indication for this individual.  She is admitted for surgery now.

## 2013-10-02 NOTE — Op Note (Signed)
Date of surgery: 10/02/2013 Preoperative diagnosis: L1-L2 herniated nucleus pulposus with myelopathy, cauda equina syndrome Postoperative diagnosis: L1-L2 herniated nucleus pulposus with myelopathy, cauda equina syndrome Procedure: Right L1-L2 laminotomy discectomy decompression of common dural tube. Repair of CSF leak. Operating microscope microdissection technique Surgeon: Barnett AbuHenry Kaleah Hagemeister First Asst.: Lisbeth RenshawNeelesh Nundkumar Anesthesia: General endotracheal Indications: Is alert hent is a 73 year old individual who has significant COPD she has developed progressive weakness in her lower extremities along with centralized back pain and has a large extruded fragment of disc at L1-L2 causing compression right at the conus. She's had weakness in addition to bowel and bladder dysfunction she's been advised regarding the need for surgical decompression.  Procedure: The patient was brought to the operating room supine on a stretcher. After the smooth induction of general endotracheal anesthesia, she was turned prone. The back was prepped with alcohol and DuraPrep. After sterile draping a midline incision was created at the region of L1-L2 and this was carried down to the lumbar dorsal fascia the localization was verified with several individual radiographs. A laminotomy was then created removing removing the central portion of the laminar arch of L1 to the region of the superior articular process of the L2 vertebrae. The common dural tube was explored on the lateral aspect in the laminotomy was enlarged by removing partially the superior portion of the pedicle of L2. The lateral aspect of the common dural tube was then carefully explored. Epidural veins in this region were cauterized. By dissecting further along the pedicle of L2 was able to localize the region of the disc and a large fragment of disc was encountered. This was removed but only in a very piecemeal fashion several other small fragments of disc were then  gradually mobilized it is felt that further exposure would be needed by drilling down on the low medial aspect of the pedicle. During this time a small dural incursion occurred. The dura was retracted medially. Further manipulation of the dura yielded the CSF leak which was followed by some herniation of several nerve roots. These were padded and protected and the discectomy ensued once the disc space was evacuated and disc was removed from behind the vertebral body of L1 good decompression of the common dural tube and the region of the conus was achieved. At this point the dural tear was explored and the inferior aspect of the dura could be isolated to place a singular 6-0 Prolene suture. This was then used as a portion of a horizontal mattress suture to include the superior portion of the dura. The nerve roots were placed back into the dural opening and a horizontal suture was closed. The discectomy site was then explored further removing some other smaller fragments of disc material from behind the vertebral body of L1 once this was accomplished several Valsalva maneuvers to 30 cm of water were performed. No CSF leakage was observed with pressurization of the dural tube. At this point it was decided to leave some Gelfoam over the dural repair and once the exploration was completed the microscope was withdrawn and the lumbar dorsal fascia was closed at L1-L2. 2-0 Vicryl was used in the subcutaneous tissues and 3-0 Vicryl was used to close subcuticular skin. Blood loss for the procedure was estimated at 200 cc. Patient tolerated procedure well was returned to recovery room in stable condition.

## 2013-10-02 NOTE — Transfer of Care (Signed)
Immediate Anesthesia Transfer of Care Note  Patient: Veronica Key  Procedure(s) Performed: Procedure(s) with comments: LUMBAR ONE-TWO LAMINECTOMY/DECOMPRESSION MICRODISCECTOMY  (N/A) - L1-2 Laminectomy  Patient Location: PACU  Anesthesia Type:General  Level of Consciousness: awake and patient cooperative  Airway & Oxygen Therapy: Patient Spontanous Breathing and Patient connected to nasal cannula oxygen  Post-op Assessment: Report given to PACU RN, Post -op Vital signs reviewed and stable and Patient moving all extremities X 4  Post vital signs: Reviewed and stable  Complications: No apparent anesthesia complications

## 2013-10-03 NOTE — Progress Notes (Signed)
Subjective: Patient reports Centralized back pain. Some leg pain. Patient has been ambulatory  Objective: Vital signs in last 24 hours: Temp:  [98 F (36.7 C)-99 F (37.2 C)] 99 F (37.2 C) (03/11 0819) Pulse Rate:  [64-85] 66 (03/11 0819) Resp:  [10-22] 16 (03/11 0819) BP: (120-159)/(50-68) 123/68 mmHg (03/11 0819) SpO2:  [92 %-98 %] 98 % (03/11 0819)  Intake/Output from previous day: 03/10 0701 - 03/11 0700 In: 1780 [P.O.:480; I.V.:1300] Out: 300 [Blood:300] Intake/Output this shift:    Unsteadiness in gait noted.Incision healing nicely.  Lab Results: No results found for this basename: WBC, HGB, HCT, PLT,  in the last 72 hours BMET No results found for this basename: NA, K, CL, CO2, GLUCOSE, BUN, CREATININE, CALCIUM,  in the last 72 hours  Studies/Results: Dg Lumbar Spine Complete  10/02/2013   CLINICAL DATA:  L1-2 laminectomy  EXAM: LUMBAR SPINE - COMPLETE 4+ VIEW  COMPARISON:  08/24/2013 lumbar spine MRI  FINDINGS: Spinal needle, followed by retractors and a probe, project posteriorly at the L1-2 disc.  Since previous imaging, no evidence of acute osseous abnormalities such as fracture, endplate erosion, or aggressive bone lesion. There is degenerative disc disease which is diffuse and advanced. Osteopenia.  IMPRESSION: Surgical localization posteriorly at the L1-2 disc level.   Electronically Signed   By: Tiburcio PeaJonathan  Watts M.D.   On: 10/02/2013 10:26    Assessment/Plan: Stable postop day 1. Patient will require rehabilitation and skilled nursing facility.  LOS: 1 day  F. L2 sign. Placement is being worked that   Black & DeckerELSNER,Janeece Blok J 10/03/2013, 9:27 AM

## 2013-10-03 NOTE — Progress Notes (Signed)
Physical Therapy Treatment Patient Details Name: Veronica Key MRN: [id number] DOB: 1941-02-28 Today's Date: 10/03/2013 Time: [id number] PT Time Calculation (min): 19 min  PT Assessment / Plan / Recommendation  History of Present Illness 73 y.o. s/p LUMBAR ONE-TWO LAMINECTOMY/DECOMPRESSION MICRODISCECTOMY  (N/A)   PT Comments   Pt adm with dx described above. Pt is progressing well towards functional goals. Pt was able to amb 150 ft at min guard with verbal cues for RW safety use. Pt continues to have difficulties recalling all of her back precautions. Pt to benefit from skilled PT to address limitations listed below. Pt to benefit from SNF to achieve safe mod I functional mobility for safe transition home.  Follow Up Recommendations  SNF     Does the patient have the potential to tolerate intense rehabilitation     Barriers to Discharge        Equipment Recommendations  Rolling walker with 5" wheels    Recommendations for Other Services    Frequency Min 3X/week   Progress towards PT Goals Progress towards PT goals: Progressing toward goals  Plan Frequency needs to be updated    Precautions / Restrictions Precautions Precautions: Back;Fall Precaution Booklet Issued: Yes (comment) Precaution Comments: reviewed precautions, pt with little recall Restrictions Weight Bearing Restrictions: No   Pertinent Vitals/Pain Pt reports pain at incision site    Mobility  Bed Mobility General bed mobility comments: not assessed, pt was in chair at begining of tx Transfers Overall transfer level: Needs assistance Equipment used: Rolling walker (2 wheeled) Transfers: Sit to/from Stand Sit to Stand: Min assist General transfer comment: verbal cues for hand placement on seat and not trunk flexion Ambulation/Gait Ambulation/Gait assistance: Min guard Ambulation Distance (Feet): 150 Feet Assistive device: Rolling walker (2 wheeled) Gait Pattern/deviations: Step-to pattern;Decreased stride  length;Shuffle Gait velocity: slow General Gait Details: guarded for falls; multiple verbal cues for RW safety and appriate use; R toe out gait (previous limitation, baseline)    Exercises     PT Diagnosis:    PT Problem List:   PT Treatment Interventions:     PT Goals (current goals can now be found in the care plan section) Acute Rehab PT Goals Patient Stated Goal: go to rehab PT Goal Formulation: With patient Time For Goal Achievement: 10/09/13 Potential to Achieve Goals: Good  Visit Information  Last PT Received On: 10/03/13 Assistance Needed: +1 History of Present Illness: 73 y.o. s/p LUMBAR ONE-TWO LAMINECTOMY/DECOMPRESSION MICRODISCECTOMY  (N/A)    Subjective Data  Patient Stated Goal: go to rehab   Cognition  Cognition Arousal/Alertness: Awake/alert Behavior During Therapy: WFL for tasks assessed/performed Overall Cognitive Status: Within Functional Limits for tasks assessed    Balance  Balance Overall balance assessment: Needs assistance Sitting-balance support: No upper extremity supported Sitting balance-Leahy Scale: Fair Sitting balance - Comments: sat edge of chair for 1 min unsupoorted  Standing balance support: Bilateral upper extremity supported Standing balance-Leahy Scale: Poor Standing balance comment: req walker to stand straight and for support  End of Session PT - End of Session Equipment Utilized During Treatment: Gait belt Activity Tolerance: Patient tolerated treatment well Patient left:  (on portable toilet seat) Nurse Communication: Mobility status   GP     Veronica Key SPT 10/03/2013, 4:00 PM

## 2013-10-03 NOTE — Progress Notes (Signed)
Physical Therapy Treatment Patient Details Name: Veronica Key L Tapley MRN: [id number] DOB: [birth year] Today's Date: 10/03/2013 Time: [id number] PT Time Calculation (min): 19 min  PT Assessment / Plan / Recommendation  History of Present Illness 73 y.o. s/p LUMBAR ONE-TWO LAMINECTOMY/DECOMPRESSION MICRODISCECTOMY  (N/A)   PT Comments   Pt adm with dx described above. Pt is progressing well towards functional goals. Pt was able to amb 150 ft at min guard with verbal cues for RW safety use. Pt continues to have difficulties recalling all of her back precautions. Pt to benefit from skilled PT to address limitations listed below. Pt to benefit from SNF to achieve safe mod I functional mobility for safe transition home.  Follow Up Recommendations  SNF     Does the patient have the potential to tolerate intense rehabilitation     Barriers to Discharge        Equipment Recommendations  Rolling walker with 5" wheels    Recommendations for Other Services    Frequency Min 3X/week   Progress towards PT Goals Progress towards PT goals: Progressing toward goals  Plan Frequency needs to be updated    Precautions / Restrictions Precautions Precautions: Back;Fall Precaution Booklet Issued: Yes (comment) Precaution Comments: reviewed precautions, pt with little recall Restrictions Weight Bearing Restrictions: No   Pertinent Vitals/Pain Pt reports pain at incision site    Mobility  Bed Mobility General bed mobility comments: not assessed, pt was in chair at begining of tx Transfers Overall transfer level: Needs assistance Equipment used: Rolling walker (2 wheeled) Transfers: Sit to/from Stand Sit to Stand: Min assist General transfer comment: verbal cues for hand placement on seat and not trunk flexion Ambulation/Gait Ambulation/Gait assistance: Min guard Ambulation Distance (Feet): 150 Feet Assistive device: Rolling walker (2 wheeled) Gait Pattern/deviations: Step-to pattern;Decreased stride  length;Shuffle Gait velocity: slow General Gait Details: guarded for falls; multiple verbal cues for RW safety and appriate use; R toe out gait (previous limitation, baseline)    Exercises     PT Diagnosis:    PT Problem List:   PT Treatment Interventions:     PT Goals (current goals can now be found in the care plan section) Acute Rehab PT Goals Patient Stated Goal: go to rehab PT Goal Formulation: With patient Time For Goal Achievement: 10/09/13 Potential to Achieve Goals: Good  Visit Information  Last PT Received On: 10/03/13 Assistance Needed: +1 History of Present Illness: 73 y.o. s/p LUMBAR ONE-TWO LAMINECTOMY/DECOMPRESSION MICRODISCECTOMY  (N/A)    Subjective Data  Patient Stated Goal: go to rehab   Cognition  Cognition Arousal/Alertness: Awake/alert Behavior During Therapy: WFL for tasks assessed/performed Overall Cognitive Status: Within Functional Limits for tasks assessed    Balance  Balance Overall balance assessment: Needs assistance Sitting-balance support: No upper extremity supported Sitting balance-Leahy Scale: Fair Sitting balance - Comments: sat edge of chair for 1 min unsupoorted  Standing balance support: Bilateral upper extremity supported Standing balance-Leahy Scale: Poor Standing balance comment: req walker to stand straight and for support  End of Session PT - End of Session Equipment Utilized During Treatment: Gait belt Activity Tolerance: Patient tolerated treatment well Patient left:  (on portable toilet seat) Nurse Communication: Mobility status   GP     Kohler,Andrew SPT 10/03/2013, 4:00 PM   Lewis ShockAshly Yacine Droz, PT, DPT Pager #: [phone] Office #: [phone]

## 2013-10-03 NOTE — Progress Notes (Signed)
UR completed 

## 2013-10-03 NOTE — Progress Notes (Addendum)
Report called to Tyron RussellYeni, RN on 4N. Pt transferred to 4N24 per MD order. Pt's son called to inform him that she was being moved to a different unit.  Rema FendtAshley Jermiah Soderman, RN

## 2013-10-04 ENCOUNTER — Encounter (HOSPITAL_COMMUNITY): Payer: Self-pay | Admitting: Neurological Surgery

## 2013-10-04 NOTE — Progress Notes (Signed)
Occupational Therapy Treatment Patient Details Name: Veronica Key MRN: [id number] DOB: 1940-08-29 Today's Date: 10/04/2013 Time: [id number] OT Time Calculation (min): 29 min  OT Assessment / Plan / Recommendation  History of present illness 73 y.o. s/p LUMBAR ONE-TWO LAMINECTOMY/DECOMPRESSION MICRODISCECTOMY  (N/A)   OT comments  Pt with increase pain today  Follow Up Recommendations  SNF;Supervision/Assistance - 24 hour    Barriers to Discharge       Equipment Recommendations  Other (comment) (defer to next venue)       Frequency Min 2X/week   Progress towards OT Goals Progress towards OT goals: Progressing toward goals     Precautions / Restrictions Precautions Precautions: Back;Fall Precaution Booklet Issued: Yes (comment) Precaution Comments: reviewed precautions, pt with little recall Restrictions Weight Bearing Restrictions: No       ADL  Toilet Transfer: Performed;Moderate assistance Toilet Transfer Method: Stand pivot AcupuncturistToilet Transfer Equipment: Bedside commode Toileting - Clothing Manipulation and Hygiene: Maximal assistance Where Assessed - Toileting Clothing Manipulation and Hygiene: Standing Transfers/Ambulation Related to ADLs: Pt needed increase A and increased time this OT viist.  Pain limiting patient    O  OT Goals(current goals can now be found in the care plan section)    Visit Information  Last OT Received On: 10/04/13 Assistance Needed: +1 History of Present Illness: 73 y.o. s/p LUMBAR ONE-TWO LAMINECTOMY/DECOMPRESSION MICRODISCECTOMY  (N/A)          Cognition  Cognition Arousal/Alertness: Awake/alert Behavior During Therapy: WFL for tasks assessed/performed Overall Cognitive Status: Within Functional Limits for tasks assessed    Mobility  Bed Mobility Overal bed mobility: Needs Assistance Bed Mobility: Rolling;Sidelying to Sit;Sit to Sidelying Rolling: Min assist Sidelying to sit: Mod assist Sit to sidelying: Mod assist General bed  mobility comments: Cues for technique. Assistance with trunk to come to sitting. Transfers Overall transfer level: Needs assistance Equipment used: Rolling walker (2 wheeled) Sit to Stand: Mod assist General transfer comment: v/c's to limited trunk flexion/bending          End of Session OT - End of Session Equipment Utilized During Treatment: Gait belt;Rolling walker Activity Tolerance: Patient tolerated treatment well Patient left: in bed;with call bell/phone within reach Nurse Communication: Other (comment) (pt back in bed)  GO     Jessia Kief, Metro KungLorraine D 10/04/2013, 1:10 PM

## 2013-10-05 MED ORDER — OXYCODONE-ACETAMINOPHEN 5-325 MG PO TABS
1.0000 | ORAL_TABLET | ORAL | Status: DC | PRN
  Administered 2013-10-05 – 2013-10-06 (×5): 2 via ORAL
  Filled 2013-10-05 (×4): qty 2

## 2013-10-05 MED ORDER — KETOROLAC TROMETHAMINE 15 MG/ML IJ SOLN
15.0000 mg | Freq: Four times a day (QID) | INTRAMUSCULAR | Status: DC
  Administered 2013-10-05 – 2013-10-06 (×4): 15 mg via INTRAVENOUS
  Filled 2013-10-05 (×5): qty 1

## 2013-10-05 NOTE — Progress Notes (Signed)
Physical Therapy Treatment Patient Details Name: Hartford PoliZula L Blish MRN: [id number] DOB: February 18, 1941 Today's Date: 10/05/2013 Time: [id number] PT Time Calculation (min): 25 min  PT Assessment / Plan / Recommendation  History of Present Illness 73 y.o. s/p LUMBAR ONE-TWO LAMINECTOMY/DECOMPRESSION MICRODISCECTOMY  (N/A)   PT Comments   Patient yelling out in pain upon my entry into the room. Attempt to get patient sitting up to relieve pain however patient continued to complain of 10/10 back pain. RN made aware and came in during session to medicate patient. Patient was able to only walk ~7310ft to recliner but unable to ambulate further due to pain. Patient stated that she had been using bedpan. [facility] patient and nursing staff [facility] and increase patient mobility throughout the day  Follow Up Recommendations  SNF     Does the patient have the potential to tolerate intense rehabilitation     Barriers to Discharge        Equipment Recommendations  Rolling walker with 5" wheels    Recommendations for Other Services    Frequency Min 3X/week   Progress towards PT Goals Progress towards PT goals: Progressing toward goals  Plan Current plan remains appropriate    Precautions / Restrictions Precautions Precautions: Back;Fall Precaution Comments: Patient able to recall no bending and twisting   Pertinent Vitals/Pain 10/10 back pain. RN aware    Mobility  Bed Mobility Overal bed mobility: Needs Assistance Bed Mobility: Rolling;Sidelying to Sit;Sit to Sidelying Rolling: Min assist Sidelying to sit: Mod assist General bed mobility comments: Cues for technique. Assistance with trunk to come to sitting. Transfers Overall transfer level: Needs assistance Equipment used: Rolling walker (2 wheeled) Sit to Stand: Mod assist General transfer comment: Cues for safest hand placement and not to pull up on RW. A to come into standing with upright posture Ambulation/Gait Ambulation/Gait  assistance: Min assist Ambulation Distance (Feet): 10 Feet Assistive device: Rolling walker (2 wheeled) Gait Pattern/deviations: Step-to pattern;Trunk flexed Gait velocity: slow General Gait Details: A for balance and safety with RW multiple verbal cues for RW safety and appriate use    Exercises     PT Diagnosis:    PT Problem List:   PT Treatment Interventions:     PT Goals (current goals can now be found in the care plan section)    Visit Information  Last PT Received On: 10/05/13 Assistance Needed: +1 History of Present Illness: 73 y.o. s/p LUMBAR ONE-TWO LAMINECTOMY/DECOMPRESSION MICRODISCECTOMY  (N/A)    Subjective Data      Cognition  Cognition Arousal/Alertness: Awake/alert Behavior During Therapy: WFL for tasks assessed/performed Overall Cognitive Status: Within Functional Limits for tasks assessed    Balance     End of Session PT - End of Session Equipment Utilized During Treatment: Gait belt Activity Tolerance: Patient limited by fatigue;Patient limited by pain Patient left: in chair;with call bell/phone within reach Nurse Communication: Mobility status   GP     Fredrich BirksRobinette, Abimelec Grochowski Elizabeth 10/05/2013, 8:52 AM 10/05/2013 Fredrich Birksobinette, Valgene Deloatch Elizabeth PTA [phone] pager [phone] office

## 2013-10-05 NOTE — Progress Notes (Signed)
Subjective: Patient reports Complains of significant back pain. Leg weakness is also noted  Objective: Vital signs in last 24 hours: Temp:  [97.5 F (36.4 C)-98.1 F (36.7 C)] 98.1 F (36.7 C) (03/13 1359) Pulse Rate:  [58-64] 64 (03/13 1359) Resp:  [20] 20 (03/13 1359) BP: (126-178)/(56-87) 155/72 mmHg (03/13 1359) SpO2:  [95 %-98 %] 95 % (03/13 1359)  Intake/Output from previous day: 03/12 0701 - 03/13 0700 In: 360 [P.O.:360] Out: 1 [Stool:1] Intake/Output this shift:    incision is clean and dry motor function appears intact in lower extremities the  Lab Results: No results found for this basename: WBC, HGB, HCT, PLT,  in the last 72 hours BMET No results found for this basename: NA, K, CL, CO2, GLUCOSE, BUN, CREATININE, CALCIUM,  in the last 72 hours  Studies/Results: No results found.  Assessment/Plan: Patient is to be placed at [facility] [place]   LOS: 3 days  FL2 signed but confirmation of bed availablilty is noty yet been verified.   Tuere Nwosu J 10/05/2013, 2:37 PM

## 2013-10-05 NOTE — Clinical Social Work Note (Signed)
CSW received handoff from 5N/3W CSW stating that pt will possibly be discharging to [facility] on Saturday, 10/06/2013. CSW has left handoff for weekend CSW to be aware of possible discharge. CSW has made multiple attempts to contact admissions liaison with [facility] with no response.  Darlyn ChamberEmily [place], LCSWA Clinical Social Worker [phone]

## 2013-10-06 MED ORDER — METHOCARBAMOL 500 MG PO TABS: 500.0000 mg | ORAL_TABLET | Freq: Four times a day (QID) | ORAL | Status: DC | PRN

## 2013-10-06 MED ORDER — TRAMADOL HCL 50 MG PO TABS: 50.0000 mg | ORAL_TABLET | Freq: Three times a day (TID) | ORAL | Status: DC | PRN

## 2013-10-06 MED ORDER — OXYCODONE-ACETAMINOPHEN 5-325 MG PO TABS: 1.0000 | ORAL_TABLET | ORAL | Status: DC | PRN

## 2013-10-06 NOTE — Progress Notes (Signed)
Patient is medically stable for D/C to [facility] in [place] today. Per Debbie admissions coordinator at [facility] patient is going to room A7 bed 2 and RN will call report to Ofilia NeasJane Pendleton RN at [phone]. Clinical Child psychotherapistocial Worker (CSW) prepared D/C packet and sent Debbie D/C summary in carefinder. CSW arranged non-emergency EMS ([facility]) for transport. CSW contacted patient's son and he told CSW to send patient via EMS and he would meet patient at [facility] later on this evening. CSW made patient aware of son's plans. Nursing is aware of above. Please reconsult if further social work needs arise. CSW signing off.   Jetta LoutBailey Morgan, LCSWA Weekend CSW [phone]

## 2013-10-06 NOTE — Discharge Summary (Signed)
Physician Discharge Summary  Patient ID: Veronica Key MRN: [id number] DOB/AGE: Dec 02, 1940 73 y.o.  Admit date: 10/02/2013 Discharge date: 10/06/2013  Admission Diagnoses:  L1-L2 herniated nucleus pulposus with myelopathy, cauda equina syndrome  Discharge Diagnoses:  L1-L2 herniated nucleus pulposus with myelopathy, cauda equina syndrome  Active Problems:   Lumbar stenosis  Discharged Condition: good  Hospital Course: Patient under the care of Dr. Danielle Dess who admitted the patient for surgery. He performed a right L1-2 lumbar laminotomy and discectomy, and repair of CSF leak. Patient has made good progress through the postoperative period and arrangements have been made by Dr. Danielle Dess for rehabilitation at a skilled nursing facility. Patient's been receiving PTOT through the postoperative period. Wound is clean and dry. Social work has made arrangements for transfer.   Patient will be able to shower, with a shower chair, with assistance. Wound does not need to be covered for showers. Patient will need to followup with Dr. Danielle Dess in 2-3 weeks in the office.  Discharge Exam: Blood pressure 180/81, pulse 58, temperature 97.7 F (36.5 C), temperature source Oral, resp. rate 20, height 4\' 11"  (1.499 m), weight 77.6 kg (171 lb 1.2 oz), SpO2 95.00%.  Disposition: Skilled nursing facility     Medication List         albuterol 108 (90 BASE) MCG/ACT inhaler  Commonly known as:  PROVENTIL HFA;VENTOLIN HFA  Inhale 2 puffs into the lungs every 4 (four) hours as needed for wheezing or shortness of breath.     albuterol (2.5 MG/3ML) 0.083% nebulizer solution  Commonly known as:  PROVENTIL  Take 2.5 mg by nebulization 4 (four) times daily as needed for wheezing or shortness of breath.     amLODipine 5 MG tablet  Commonly known as:  NORVASC  Take 5 mg by mouth daily.     benazepril 20 MG tablet  Commonly known as:  LOTENSIN  Take 20 mg by mouth 2 (two) times daily.     celecoxib 200 MG  capsule  Commonly known as:  CELEBREX  Take 200 mg by mouth daily.     diazepam 5 MG tablet  Commonly known as:  VALIUM  Take 5 mg by mouth 2 (two) times daily.     DULoxetine 60 MG capsule  Commonly known as:  CYMBALTA  Take 60 mg by mouth daily.     HYDROcodone-acetaminophen 10-325 MG per tablet  Commonly known as:  NORCO  Take 1 tablet by mouth 2 (two) times daily as needed (pain).     labetalol 200 MG tablet  Commonly known as:  NORMODYNE  Take 200 mg by mouth 3 (three) times daily.     methocarbamol 500 MG tablet  Commonly known as:  ROBAXIN  Take 1 tablet (500 mg total) by mouth every 6 (six) hours as needed for muscle spasms.     oxybutynin 5 MG tablet  Commonly known as:  DITROPAN  Take 10 mg by mouth daily.     oxyCODONE-acetaminophen 5-325 MG per tablet  Commonly known as:  PERCOCET/ROXICET  Take 1-2 tablets by mouth every 4 (four) hours as needed for severe pain.     pantoprazole 40 MG tablet  Commonly known as:  PROTONIX  Take 40 mg by mouth daily.     traMADol 50 MG tablet  Commonly known as:  ULTRAM  Take 1 tablet (50 mg total) by mouth 3 (three) times daily as needed for moderate pain.     traMADol 50 MG tablet  Commonly known  as:  ULTRAM  Take 50 mg by mouth 3 (three) times daily as needed for moderate pain.         Signed: Hewitt ShortsNUDELMAN,ROBERT W, MD 10/06/2013, 9:20 AM

## 2013-10-06 NOTE — Progress Notes (Signed)
Pt. DC'd via [facility] to [facility] in [place].  Vital signs and assessments were stable.

## 2013-10-06 NOTE — Progress Notes (Signed)
[facility].

## 2013-12-25 ENCOUNTER — Other Ambulatory Visit (HOSPITAL_COMMUNITY): Payer: Self-pay | Admitting: Family Medicine

## 2013-12-25 ENCOUNTER — Ambulatory Visit (HOSPITAL_COMMUNITY)
Admission: RE | Admit: 2013-12-25 | Discharge: 2013-12-25 | Disposition: A | Discharge status: Home / Self Care | Payer: Medicare Other | Source: Ambulatory Visit | Attending: Family Medicine | Admitting: Family Medicine

## 2013-12-25 DIAGNOSIS — M79672 Pain in left foot: Secondary | ICD-10-CM

## 2013-12-25 DIAGNOSIS — X58XXXA Exposure to other specified factors, initial encounter: Secondary | ICD-10-CM | POA: Diagnosis not present

## 2013-12-25 DIAGNOSIS — M19079 Primary osteoarthritis, unspecified ankle and foot: Secondary | ICD-10-CM | POA: Diagnosis not present

## 2013-12-25 DIAGNOSIS — M810 Age-related osteoporosis without current pathological fracture: Secondary | ICD-10-CM | POA: Diagnosis not present

## 2013-12-25 DIAGNOSIS — Y929 Unspecified place or not applicable: Secondary | ICD-10-CM | POA: Insufficient documentation

## 2013-12-25 DIAGNOSIS — S92309A Fracture of unspecified metatarsal bone(s), unspecified foot, initial encounter for closed fracture: Secondary | ICD-10-CM | POA: Diagnosis not present

## 2013-12-25 DIAGNOSIS — M79609 Pain in unspecified limb: Secondary | ICD-10-CM | POA: Diagnosis present

## 2014-01-30 ENCOUNTER — Inpatient Hospital Stay (HOSPITAL_COMMUNITY): Payer: Medicare Other

## 2014-01-30 ENCOUNTER — Emergency Department (HOSPITAL_COMMUNITY): Payer: Medicare Other

## 2014-01-30 ENCOUNTER — Encounter (HOSPITAL_COMMUNITY): Payer: Self-pay | Admitting: Emergency Medicine

## 2014-01-30 ENCOUNTER — Inpatient Hospital Stay (HOSPITAL_COMMUNITY)
Admission: EM | Admit: 2014-01-30 | Discharge: 2014-02-04 | DRG: 389 | Disposition: A | Discharge status: Home / Self Care | Payer: Medicare Other | Attending: Internal Medicine | Admitting: Internal Medicine

## 2014-01-30 DIAGNOSIS — K219 Gastro-esophageal reflux disease without esophagitis: Secondary | ICD-10-CM | POA: Diagnosis present

## 2014-01-30 DIAGNOSIS — J42 Unspecified chronic bronchitis: Secondary | ICD-10-CM

## 2014-01-30 DIAGNOSIS — F329 Major depressive disorder, single episode, unspecified: Secondary | ICD-10-CM | POA: Diagnosis present

## 2014-01-30 DIAGNOSIS — F411 Generalized anxiety disorder: Secondary | ICD-10-CM

## 2014-01-30 DIAGNOSIS — I10 Essential (primary) hypertension: Secondary | ICD-10-CM

## 2014-01-30 DIAGNOSIS — N318 Other neuromuscular dysfunction of bladder: Secondary | ICD-10-CM

## 2014-01-30 DIAGNOSIS — E876 Hypokalemia: Secondary | ICD-10-CM | POA: Diagnosis present

## 2014-01-30 DIAGNOSIS — Z8669 Personal history of other diseases of the nervous system and sense organs: Secondary | ICD-10-CM

## 2014-01-30 DIAGNOSIS — Z6829 Body mass index (BMI) 29.0-29.9, adult: Secondary | ICD-10-CM

## 2014-01-30 DIAGNOSIS — Z87442 Personal history of urinary calculi: Secondary | ICD-10-CM

## 2014-01-30 DIAGNOSIS — E669 Obesity, unspecified: Secondary | ICD-10-CM | POA: Diagnosis present

## 2014-01-30 DIAGNOSIS — M129 Arthropathy, unspecified: Secondary | ICD-10-CM | POA: Diagnosis present

## 2014-01-30 DIAGNOSIS — M549 Dorsalgia, unspecified: Secondary | ICD-10-CM | POA: Diagnosis present

## 2014-01-30 DIAGNOSIS — K279 Peptic ulcer, site unspecified, unspecified as acute or chronic, without hemorrhage or perforation: Secondary | ICD-10-CM | POA: Diagnosis present

## 2014-01-30 DIAGNOSIS — E119 Type 2 diabetes mellitus without complications: Secondary | ICD-10-CM

## 2014-01-30 DIAGNOSIS — N390 Urinary tract infection, site not specified: Secondary | ICD-10-CM | POA: Diagnosis present

## 2014-01-30 DIAGNOSIS — K56609 Unspecified intestinal obstruction, unspecified as to partial versus complete obstruction: Principal | ICD-10-CM

## 2014-01-30 DIAGNOSIS — J449 Chronic obstructive pulmonary disease, unspecified: Secondary | ICD-10-CM | POA: Diagnosis present

## 2014-01-30 DIAGNOSIS — F3289 Other specified depressive episodes: Secondary | ICD-10-CM

## 2014-01-30 DIAGNOSIS — G8929 Other chronic pain: Secondary | ICD-10-CM | POA: Diagnosis present

## 2014-01-30 DIAGNOSIS — M48061 Spinal stenosis, lumbar region without neurogenic claudication: Secondary | ICD-10-CM

## 2014-01-30 DIAGNOSIS — Z96659 Presence of unspecified artificial knee joint: Secondary | ICD-10-CM

## 2014-01-30 DIAGNOSIS — F17200 Nicotine dependence, unspecified, uncomplicated: Secondary | ICD-10-CM | POA: Diagnosis present

## 2014-01-30 DIAGNOSIS — F32A Depression, unspecified: Secondary | ICD-10-CM | POA: Diagnosis present

## 2014-01-30 DIAGNOSIS — J4489 Other specified chronic obstructive pulmonary disease: Secondary | ICD-10-CM | POA: Diagnosis present

## 2014-01-30 LAB — LACTIC ACID, PLASMA: Lactic Acid, Venous: 1.3 mmol/L (ref 0.5–2.2)

## 2014-01-30 LAB — COMPREHENSIVE METABOLIC PANEL
ALBUMIN: 3.7 g/dL (ref 3.5–5.2)
ALT: 9 U/L (ref 0–35)
ANION GAP: 13 (ref 5–15)
AST: 15 U/L (ref 0–37)
Alkaline Phosphatase: 91 U/L (ref 39–117)
BILIRUBIN TOTAL: 0.8 mg/dL (ref 0.3–1.2)
BUN: 13 mg/dL (ref 6–23)
CO2: 25 mEq/L (ref 19–32)
CREATININE: 0.65 mg/dL (ref 0.50–1.10)
Calcium: 10.2 mg/dL (ref 8.4–10.5)
Chloride: 95 mEq/L — ABNORMAL LOW (ref 96–112)
GFR calc Af Amer: >90 mL/min (ref >90)
GFR calc non Af Amer: 87 mL/min — ABNORMAL LOW (ref >90)
Glucose, Bld: 126 mg/dL — ABNORMAL HIGH (ref 70–99)
Potassium: 3.8 mEq/L (ref 3.7–5.3)
Sodium: 133 mEq/L — ABNORMAL LOW (ref 137–147)
TOTAL PROTEIN: 6.9 g/dL (ref 6.0–8.3)

## 2014-01-30 LAB — URINALYSIS, ROUTINE W REFLEX MICROSCOPIC
Bilirubin Urine: NEGATIVE
Glucose, UA: NEGATIVE mg/dL
Ketones, ur: NEGATIVE mg/dL
Nitrite: NEGATIVE
Protein, ur: NEGATIVE mg/dL
SPECIFIC GRAVITY, URINE: 1.01 (ref 1.005–1.030)
UROBILINOGEN UA: 0.2 mg/dL (ref 0.0–1.0)
pH: 8 (ref 5.0–8.0)

## 2014-01-30 LAB — CBC WITH DIFFERENTIAL/PLATELET
BASOS PCT: 0 % (ref 0–1)
Basophils Absolute: 0 10*3/uL (ref 0.0–0.1)
EOS PCT: 0 % (ref 0–5)
Eosinophils Absolute: 0 10*3/uL (ref 0.0–0.7)
HEMATOCRIT: 39.4 % (ref 36.0–46.0)
Hemoglobin: 13.5 g/dL (ref 12.0–15.0)
Lymphocytes Relative: 13 % (ref 12–46)
Lymphs Abs: 1.5 10*3/uL (ref 0.7–4.0)
MCH: 29.4 pg (ref 26.0–34.0)
MCHC: 34.3 g/dL (ref 30.0–36.0)
MCV: 85.8 fL (ref 78.0–100.0)
MONO ABS: 1 10*3/uL (ref 0.1–1.0)
Monocytes Relative: 8 % (ref 3–12)
Neutro Abs: 9.4 10*3/uL — ABNORMAL HIGH (ref 1.7–7.7)
Neutrophils Relative %: 79 % — ABNORMAL HIGH (ref 43–77)
Platelets: 278 10*3/uL (ref 150–400)
RBC: 4.59 MIL/uL (ref 3.87–5.11)
RDW: 14.3 % (ref 11.5–15.5)
WBC: 12 10*3/uL — ABNORMAL HIGH (ref 4.0–10.5)

## 2014-01-30 LAB — CLOSTRIDIUM DIFFICILE BY PCR: CDIFFPCR: NEGATIVE

## 2014-01-30 LAB — POC OCCULT BLOOD, ED: Fecal Occult Bld: NEGATIVE

## 2014-01-30 LAB — LIPASE, BLOOD: LIPASE: 18 U/L (ref 11–59)

## 2014-01-30 LAB — URINE MICROSCOPIC-ADD ON

## 2014-01-30 LAB — MRSA PCR SCREENING: MRSA by PCR: POSITIVE — AB

## 2014-01-30 MED ORDER — LORAZEPAM 2 MG/ML IJ SOLN: 1.0000 mg | Freq: Four times a day (QID) | INTRAMUSCULAR | Status: DC | PRN

## 2014-01-30 MED ORDER — SODIUM CHLORIDE 0.9 % IV SOLN
INTRAVENOUS | Status: DC
  Administered 2014-01-30: 13:00:00 via INTRAVENOUS

## 2014-01-30 MED ORDER — HYDRALAZINE HCL 20 MG/ML IJ SOLN
10.0000 mg | Freq: Four times a day (QID) | INTRAMUSCULAR | Status: DC | PRN
  Filled 2014-01-30: qty 1

## 2014-01-30 MED ORDER — PANTOPRAZOLE SODIUM 40 MG IV SOLR
40.0000 mg | INTRAVENOUS | Status: DC
  Administered 2014-01-30 – 2014-02-03 (×5): 40 mg via INTRAVENOUS
  Filled 2014-01-30 (×5): qty 40

## 2014-01-30 MED ORDER — SODIUM CHLORIDE 0.9 % IV SOLN
INTRAVENOUS | Status: DC
  Administered 2014-01-30 – 2014-01-31 (×4): via INTRAVENOUS

## 2014-01-30 MED ORDER — ACETAMINOPHEN 325 MG PO TABS
650.0000 mg | ORAL_TABLET | Freq: Four times a day (QID) | ORAL | Status: DC | PRN
  Administered 2014-01-31 – 2014-02-03 (×4): 650 mg via ORAL
  Filled 2014-01-30 (×4): qty 2

## 2014-01-30 MED ORDER — IOHEXOL 300 MG/ML  SOLN
100.0000 mL | Freq: Once | INTRAMUSCULAR | Status: AC | PRN
  Administered 2014-01-30: 100 mL via INTRAVENOUS

## 2014-01-30 MED ORDER — MORPHINE SULFATE 4 MG/ML IJ SOLN
4.0000 mg | INTRAMUSCULAR | Status: DC | PRN
  Administered 2014-01-30: 4 mg via INTRAVENOUS
  Filled 2014-01-30: qty 1

## 2014-01-30 MED ORDER — ALBUTEROL SULFATE (2.5 MG/3ML) 0.083% IN NEBU: 2.5000 mg | INHALATION_SOLUTION | RESPIRATORY_TRACT | Status: DC | PRN

## 2014-01-30 MED ORDER — DIAZEPAM 5 MG PO TABS
5.0000 mg | ORAL_TABLET | Freq: Two times a day (BID) | ORAL | Status: DC
  Administered 2014-01-30 – 2014-02-04 (×9): 5 mg via ORAL
  Filled 2014-01-30 (×10): qty 1

## 2014-01-30 MED ORDER — ACETAMINOPHEN 650 MG RE SUPP: 650.0000 mg | Freq: Four times a day (QID) | RECTAL | Status: DC | PRN

## 2014-01-30 MED ORDER — SODIUM CHLORIDE 0.9 % IJ SOLN
INTRAMUSCULAR | Status: AC
  Filled 2014-01-30: qty 300

## 2014-01-30 MED ORDER — GUAIFENESIN-DM 100-10 MG/5ML PO SYRP: 5.0000 mL | ORAL_SOLUTION | ORAL | Status: DC | PRN

## 2014-01-30 MED ORDER — ONDANSETRON HCL 4 MG PO TABS: 4.0000 mg | ORAL_TABLET | Freq: Four times a day (QID) | ORAL | Status: DC | PRN

## 2014-01-30 MED ORDER — ONDANSETRON HCL 4 MG/2ML IJ SOLN
4.0000 mg | INTRAMUSCULAR | Status: DC | PRN
  Administered 2014-01-30: 4 mg via INTRAVENOUS
  Filled 2014-01-30 (×2): qty 2

## 2014-01-30 MED ORDER — METOPROLOL TARTRATE 1 MG/ML IV SOLN
2.5000 mg | Freq: Four times a day (QID) | INTRAVENOUS | Status: DC
  Administered 2014-01-30 – 2014-02-01 (×7): 2.5 mg via INTRAVENOUS
  Filled 2014-01-30 (×7): qty 5

## 2014-01-30 MED ORDER — HEPARIN SODIUM (PORCINE) 5000 UNIT/ML IJ SOLN
5000.0000 [IU] | Freq: Three times a day (TID) | INTRAMUSCULAR | Status: DC
  Administered 2014-01-30 – 2014-02-04 (×14): 5000 [IU] via SUBCUTANEOUS
  Filled 2014-01-30 (×14): qty 1

## 2014-01-30 MED ORDER — MORPHINE SULFATE 2 MG/ML IJ SOLN
1.0000 mg | INTRAMUSCULAR | Status: DC | PRN
  Administered 2014-01-31: 1 mg via INTRAVENOUS
  Filled 2014-01-30 (×2): qty 1

## 2014-01-30 MED ORDER — ONDANSETRON HCL 4 MG/2ML IJ SOLN
4.0000 mg | Freq: Four times a day (QID) | INTRAMUSCULAR | Status: DC | PRN
  Administered 2014-01-30 – 2014-02-03 (×2): 4 mg via INTRAVENOUS
  Filled 2014-01-30: qty 2

## 2014-01-30 NOTE — ED Notes (Signed)
Patient assisted with bedpan.

## 2014-01-30 NOTE — H&P (Signed)
PATIENT DETAILS Name: Veronica Key Age: 73 y.o. Sex: female Date of Birth: 1941/06/18 Admit Date: 01/30/2014 UEA:[id number],[id number]:KNOWLTON,[name], MD   CHIEF COMPLAINT:  Abdominal pain, vomiting since yesterday evening  HPI: Veronica Key is a 73 y.o. female with a Past Medical History of hypertension, anxiety, peptic ulcer disease who presents today with the above noted complaint. Per patient, she was in her usual state of health, around dinnertime yesterday when she started having upper abdominal pain which she describes as colicky in nature, 10/10 in severity, without any radiation. This was associated with numerous episodes of perfuse vomiting which was nonbloody. Patient continued to have these symptoms over the morning as well, this result she proceeded to come to the emergency room, where a CT scan of the abdomen was positive for small bowel obstruction. I was subsequently asked to admit this patient for further evaluation and treatment Patient claims to have had a small bowel movement earlier this morning, has not had any flatus since then. She denies any fever, headache, chest pain or shortness of breath. Patient claims to have had a open cholecystectomy and a hysterectomy in the past.   ALLERGIES:   Allergies  Allergen Reactions  . Adhesive [Tape]     Itching and redness around site;Pt requests paper tape  . Cephalexin Nausea And Vomiting    PAST MEDICAL HISTORY: Past Medical History  Diagnosis Date  . GERD (gastroesophageal reflux disease)     takes Protonix daily  . Urinary frequency     takes Ditropan daily  . Depression     takes Cymbalta daily  . Anxiety     takes Valium daily  . COPD (chronic obstructive pulmonary disease)     uses ALbuterol prn daily  . Hypertension     takes Amlodipine,Benazepril,and Labetalol daily  . Shortness of breath     with exertion  . History of bronchitis     last time in 1976  . Pneumonia     hx of-many yrs ago  . Headache(784.0)       occasionally  . Numbness     both feet  . Arthritis   . Joint pain   . Peripheral edema     but not on any meds  . Chronic back pain     herniated disc and stenosis  . Carpal tunnel syndrome   . H/O hiatal hernia   . History of gastric ulcer   . Urinary urgency   . History of kidney stones     PAST SURGICAL HISTORY: Past Surgical History  Procedure Laterality Date  . Cholecystectomy    . Abdominal hysterectomy    . Bladder tacked    . Shoulder surgery Bilateral   . Joint replacement Bilateral     knee   . Cataract surgery Bilateral   . Epidural block injection    . Esophagogastroduodenoscopy    . Lumbar laminectomy/decompression microdiscectomy N/A 10/02/2013    Procedure: LUMBAR ONE-TWO LAMINECTOMY/DECOMPRESSION MICRODISCECTOMY ;  Surgeon: Barnett AbuHenry Elsner, MD;  Location: [facility];  Service: Neurosurgery;  Laterality: N/A;  L1-2 Laminectomy    MEDICATIONS AT HOME: Prior to Admission medications   Medication Sig Start Date End Date Taking? Authorizing Provider  amLODipine (NORVASC) 5 MG tablet Take 5 mg by mouth daily.   Yes Historical Provider, MD  benazepril (LOTENSIN) 20 MG tablet Take 20 mg by mouth 2 (two) times daily.   Yes Historical Provider, MD  celecoxib (CELEBREX) 200  MG capsule Take 200 mg by mouth daily.   Yes Historical Provider, MD  diazepam (VALIUM) 5 MG tablet Take 5 mg by mouth 2 (two) times daily.   Yes Historical Provider, MD  DULoxetine (CYMBALTA) 60 MG capsule Take 60 mg by mouth daily.   Yes Historical Provider, MD  HYDROcodone-acetaminophen (NORCO) 10-325 MG per tablet Take 1 tablet by mouth 2 (two) times daily as needed (pain).   Yes Historical Provider, MD  labetalol (NORMODYNE) 200 MG tablet Take 400 mg by mouth 2 (two) times daily.    Yes Historical Provider, MD  oxybutynin (DITROPAN) 5 MG tablet Take 10 mg by mouth daily.   Yes Historical Provider, MD  pantoprazole (PROTONIX) 40 MG tablet Take 40 mg by mouth daily.   Yes Historical Provider,  MD  tiZANidine (ZANAFLEX) 4 MG tablet Take 4 mg by mouth 3 (three) times daily. 12/28/13  Yes Historical Provider, MD  albuterol (PROVENTIL HFA;VENTOLIN HFA) 108 (90 BASE) MCG/ACT inhaler Inhale 2 puffs into the lungs every 4 (four) hours as needed for wheezing or shortness of breath.    Historical Provider, MD  albuterol (PROVENTIL) (2.5 MG/3ML) 0.083% nebulizer solution Take 2.5 mg by nebulization 4 (four) times daily as needed for wheezing or shortness of breath.    Historical Provider, MD    FAMILY HISTORY: Denies any family history of coronary artery disease  SOCIAL HISTORY:  reports that she has been smoking.  She does not have any smokeless tobacco history on file. She reports that she does not drink alcohol or use illicit drugs.  REVIEW OF SYSTEMS:  Constitutional:   No  weight loss, night sweats,  Fevers, chills, fatigue.  HEENT:    No headaches, Difficulty swallowing,Tooth/dental problems,Sore throat,  No sneezing, itching, ear ache, nasal congestion, post nasal drip,   Cardio-vascular: No chest pain,  Orthopnea, PND, swelling in lower extremities, anasarca,dizziness, palpitations  GI:  No heartburn, indigestion, diarrhea, change in bowel habits, loss of appetite  Resp: No shortness of breath with exertion or at rest.  No excess mucus, no productive cough, No non-productive cough,  No coughing up of blood.No change in color of mucus.No wheezing.No chest wall deformity  Skin:  no rash or lesions.  GU:  no dysuria, change in color of urine, no urgency or frequency.  No flank pain.  Musculoskeletal: No joint pain or swelling.  No decreased range of motion.  No back pain.  Psych: No change in mood or affect. No depression or anxiety.  No memory loss.   PHYSICAL EXAM: Blood pressure 174/73, pulse 66, temperature 99 F (37.2 C), temperature source Rectal, resp. rate 19, height 4\' 11"  (1.499 m), weight 65.318 kg (144 lb), SpO2 96.00%.  General appearance :Awake, alert,  not in any distress. Speech Clear. Not toxic Looking HEENT: Atraumatic and Normocephalic, pupils equally reactive to light and accomodation Neck: supple, no JVD. No cervical lymphadenopathy.  Chest:Good air entry bilaterally, no added sounds  CVS: S1 S2 regular, no murmurs.  Abdomen: Bowel sounds sluggish, tender in the upper abdominal area without guarding or rigidity or rebound. There is some mild abdominal distention.  Extremities: B/L Lower Ext shows no edema, both legs are warm to touch Neurology: Awake alert, and oriented X 3, Non focal Skin:No Rash Wounds:N/A  LABS ON ADMISSION:   Recent Labs  01/30/14 1230  NA 133*  K 3.8  CL 95*  CO2 25  GLUCOSE 126*  BUN 13  CREATININE 0.65  CALCIUM 10.2    Recent  Labs  01/30/14 1230  AST 15  ALT 9  ALKPHOS 91  BILITOT 0.8  PROT 6.9  ALBUMIN 3.7    Recent Labs  01/30/14 1230  LIPASE 18    Recent Labs  01/30/14 1230  WBC 12.0*  NEUTROABS 9.4*  HGB 13.5  HCT 39.4  MCV 85.8  PLT 278   No results found for this basename: CKTOTAL, CKMB, CKMBINDEX, TROPONINI,  in the last 72 hours No results found for this basename: DDIMER,  in the last 72 hours No components found with this basename: POCBNP,    RADIOLOGIC STUDIES ON ADMISSION: Ct Abdomen Pelvis W Contrast  01/30/2014   CLINICAL DATA:  Pain.  EXAM: CT ABDOMEN AND PELVIS WITH CONTRAST  TECHNIQUE: Multidetector CT imaging of the abdomen and pelvis was performed using the standard protocol following bolus administration of intravenous contrast.  CONTRAST:  100mL OMNIPAQUE IOHEXOL 300 MG/ML  SOLN  COMPARISON:  CT 08/10/2013.  FINDINGS: No focal hepatic abnormality. Tiny calcifications in spleen consistent with granulomas. Pancreas is unremarkable. The common bile duct and intrahepatic ducts are distended. Although this could be from cholecystectomy other etiologies cannot be excluded. Although no obstructing lesion is identified MRCP should be considered for further  evaluation given the degree of biliary distention. Common bile duct measures 16 mm in diameter.  Adrenals are normal. No focal renal abnormality. No hydronephrosis or obstructing ureteral stone. Bladder is nondistended. Hysterectomy. No adnexal mass. Small amount of free pelvic fluid.  Shotty inguinal retroperitoneal lymph nodes. Abdominal aorta is atherosclerotic. No aneurysm. Visceral vessels are patent. Bilateral renal artery atherosclerotic vascular changes are present. Portal vein is present.  The appendix is difficult to visualize. No inflammatory change in right lower quadrant. Stool is noted throughout the colon. The colon is nondistended. Multiple dilated loops of small bowel noted. Distal small bowel is decompressed. These findings are consistent with small bowel obstruction. No free air or pneumatosis. No portal venous air. No hernia.  Cardiomegaly. Coronary artery disease. Mild basilar atelectasis and/or scarring. Severe degenerative changes and scoliosis lumbar spine.  IMPRESSION: 1. Mid to distal small bowel obstruction. 2. Prominent distention of the common bile duct and intrahepatic ducts. Although this could be from cholecystectomy MRCP should be considered for further evaluation.   Electronically Signed   By: Maisie Fushomas  Register   On: 01/30/2014 14:44   Dg Chest Port 1 View  01/30/2014   CLINICAL DATA:  COPD, pain  EXAM: PORTABLE CHEST - 1 VIEW  COMPARISON:  09/28/2013  FINDINGS: The cardiac shadow is enlarged. The lungs are clear bilaterally. Chronic changes in the left shoulder joint are seen. Postsurgical changes in the right shoulder joint are noted.  IMPRESSION: No acute abnormality seen.   Electronically Signed   By: Alcide CleverMark  Lukens M.D.   On: 01/30/2014 14:41     ASSESSMENT AND PLAN: Present on Admission:  . SBO (small bowel obstruction) - Suspect secondary to adhesions from prior laparotomy. Admit for supportive care, place NG tube. Keep n.p.o., hydrate. Have consulted surgery. Hopefully  she will resolve with the supportive care. Currently belly exam is relatively benign, she does have some upper abdominal tenderness and mild abdominal distention. She would need to be followed closely.   Marland Kitchen. ANXIETY - Continue with benzodiazepines. Currently stable.   Marland Kitchen. DEPRESSION - Since n.p.o., hold Cymbalta. Resume and able. Currently her depression seems stable.   Marland Kitchen. HYPERTENSION - Hold oral antihypertensive medications as n.p.o., will start him on 5 mg of IV Lopressor every 6 hours,  wIll use as needed hydralazine as well.   Marland Kitchen PEPTIC ULCER DISEASE - Continue PPI.  Further plan will depend as patient's clinical course evolves and further radiologic and laboratory data become available. Patient will be monitored closely.  Above noted plan was discussed with patient, she was in agreement.   DVT Prophylaxis: Prophylactic Heparin  Code Status: Full Code  Total time spent for admission equals 45 minutes.  [facility] Pager [phone]  If 7PM-7AM, please contact night-coverage [email or web address] Password [email or web address] 01/30/2014, 4:16 PM  **Disclaimer: This note may have been dictated with voice recognition software. Similar sounding words can inadvertently be transcribed and this note may contain transcription errors which may not have been corrected upon publication of note.**

## 2014-01-30 NOTE — ED Notes (Signed)
Stool sample obtained from rectal thermometer. Only small amount obtained. Patient unable to have bowel movement at this time. States she regularly goes in the morning every day and had a normal BM this morning.

## 2014-01-30 NOTE — ED Notes (Signed)
[facility] Patient will stop by xray on way to floor for NG tube placement confirmation.

## 2014-01-30 NOTE — ED Provider Notes (Signed)
CSN: [id number]     Arrival date & time 01/30/14  1148 History   First MD Initiated Contact with Patient 01/30/14 1210     Chief Complaint  Patient presents with  . Emesis  . Abdominal Pain      HPI Pt was seen at 1210. Per pt, c/o gradual onset and persistence of multiple intermittent episodes of N/V that began at 1800 last night. Has been associated with generalized abd "sharp pain." Had a "small BM" this morning, denies diarrhea. States one of her caregivers "came to my house sick" in the past week with similar symptoms. Denies CP/SOB, no back pain, no fevers, no black or blood in stools or emesis.     Past Medical History  Diagnosis Date  . GERD (gastroesophageal reflux disease)     takes Protonix daily  . Urinary frequency     takes Ditropan daily  . Depression     takes Cymbalta daily  . Anxiety     takes Valium daily  . COPD (chronic obstructive pulmonary disease)     uses ALbuterol prn daily  . Hypertension     takes Amlodipine,Benazepril,and Labetalol daily  . Shortness of breath     with exertion  . History of bronchitis     last time in 1976  . Pneumonia     hx of-many yrs ago  . Headache(784.0)     occasionally  . Numbness     both feet  . Arthritis   . Joint pain   . Peripheral edema     but not on any meds  . Chronic back pain     herniated disc and stenosis  . Carpal tunnel syndrome   . H/O hiatal hernia   . History of gastric ulcer   . Urinary urgency   . History of kidney stones    Past Surgical History  Procedure Laterality Date  . Cholecystectomy    . Abdominal hysterectomy    . Bladder tacked    . Shoulder surgery Bilateral   . Joint replacement Bilateral     knee   . Cataract surgery Bilateral   . Epidural block injection    . Esophagogastroduodenoscopy    . Lumbar laminectomy/decompression microdiscectomy N/A 10/02/2013    Procedure: LUMBAR ONE-TWO LAMINECTOMY/DECOMPRESSION MICRODISCECTOMY ;  Surgeon: Barnett AbuHenry Elsner, MD;  Location: [facility];  Service: Neurosurgery;  Laterality: N/A;  L1-2 Laminectomy    History  Substance Use Topics  . Smoking status: Current Every Day Smoker -- 0.50 packs/day for 35 years  . Smokeless tobacco: Not on file  . Alcohol Use: No    Review of Systems ROS: Statement: All systems negative except as marked or noted in the HPI; Constitutional: Negative for fever and chills. ; ; Eyes: Negative for eye pain, redness and discharge. ; ; ENMT: Negative for ear pain, hoarseness, nasal congestion, sinus pressure and sore throat. ; ; Cardiovascular: Negative for chest pain, palpitations, diaphoresis, dyspnea and peripheral edema. ; ; Respiratory: Negative for cough, wheezing and stridor. ; ; Gastrointestinal: +N/V, abd pain. Negative for diarrhea, blood in stool, hematemesis, jaundice and rectal bleeding. . ; ; Genitourinary: Negative for dysuria, flank pain and hematuria. ; ; Musculoskeletal: Negative for back pain and neck pain. Negative for swelling and trauma.; ; Skin: Negative for pruritus, rash, abrasions, blisters, bruising and skin lesion.; ; Neuro: Negative for headache, lightheadedness and neck stiffness. Negative for weakness, altered level of consciousness , altered mental status, extremity weakness, paresthesias, involuntary movement,  seizure and syncope.      Allergies  Adhesive and Cephalexin  Home Medications   Prior to Admission medications   Medication Sig Start Date End Date Taking? Authorizing Provider  amLODipine (NORVASC) 5 MG tablet Take 5 mg by mouth daily.   Yes Historical Provider, MD  benazepril (LOTENSIN) 20 MG tablet Take 20 mg by mouth 2 (two) times daily.   Yes Historical Provider, MD  celecoxib (CELEBREX) 200 MG capsule Take 200 mg by mouth daily.   Yes Historical Provider, MD  diazepam (VALIUM) 5 MG tablet Take 5 mg by mouth 2 (two) times daily.   Yes Historical Provider, MD  DULoxetine (CYMBALTA) 60 MG capsule Take 60 mg by mouth daily.   Yes Historical Provider, MD   HYDROcodone-acetaminophen (NORCO) 10-325 MG per tablet Take 1 tablet by mouth 2 (two) times daily as needed (pain).   Yes Historical Provider, MD  labetalol (NORMODYNE) 200 MG tablet Take 400 mg by mouth 2 (two) times daily.    Yes Historical Provider, MD  oxybutynin (DITROPAN) 5 MG tablet Take 10 mg by mouth daily.   Yes Historical Provider, MD  pantoprazole (PROTONIX) 40 MG tablet Take 40 mg by mouth daily.   Yes Historical Provider, MD  tiZANidine (ZANAFLEX) 4 MG tablet Take 4 mg by mouth 3 (three) times daily. 12/28/13  Yes Historical Provider, MD  albuterol (PROVENTIL HFA;VENTOLIN HFA) 108 (90 BASE) MCG/ACT inhaler Inhale 2 puffs into the lungs every 4 (four) hours as needed for wheezing or shortness of breath.    Historical Provider, MD  albuterol (PROVENTIL) (2.5 MG/3ML) 0.083% nebulizer solution Take 2.5 mg by nebulization 4 (four) times daily as needed for wheezing or shortness of breath.    Historical Provider, MD   BP 145/71  Pulse 87  Temp(Src) 97.8 F (36.6 C) (Oral)  Resp 18  Ht 4\' 11"  (1.499 m)  Wt 144 lb (65.318 kg)  BMI 29.07 kg/m2  SpO2 96% Physical Exam 1215: Physical examination:  Nursing notes reviewed; Vital signs and O2 SAT reviewed;  Constitutional: Well developed, Well nourished, Well hydrated, Uncomfortable appearing.; Head:  Normocephalic, atraumatic; Eyes: EOMI, PERRL, No scleral icterus; ENMT: Mouth and pharynx normal, Mucous membranes moist; Neck: Supple, Full range of motion, No lymphadenopathy; Cardiovascular: Regular rate and rhythm, No gallop; Respiratory: Breath sounds clear & equal bilaterally, No rales, rhonchi, wheezes.  Speaking full sentences with ease, Normal respiratory effort/excursion; Chest: Nontender, Movement normal; Abdomen:  +diffuse tenderness to palp, +softly distended and tympanitic. Decreased bowel sounds; Genitourinary: No CVA tenderness; Extremities: Pulses normal, No tenderness, No edema, No calf edema or asymmetry.; Neuro: AA&Ox3, Major CN  grossly intact.  Speech clear. No gross focal motor or sensory deficits in extremities.; Skin: Color normal, Warm, Dry.   ED Course  Procedures     EKG Interpretation None      MDM  MDM Reviewed: previous chart, nursing note and vitals Reviewed previous: labs Interpretation: labs, ultrasound and CT scan     Results for orders placed during the hospital encounter of 01/30/14  CBC WITH DIFFERENTIAL      Result Value Ref Range   WBC 12.0 (*) 4.0 - 10.5 K/uL   RBC 4.59  3.87 - 5.11 MIL/uL   Hemoglobin 13.5  12.0 - 15.0 g/dL   HCT 16.1  09.6 - 04.5 %   MCV 85.8  78.0 - 100.0 fL   MCH 29.4  26.0 - 34.0 pg   MCHC 34.3  30.0 - 36.0 g/dL   RDW  14.3  11.5 - 15.5 %   Platelets 278  150 - 400 K/uL   Neutrophils Relative % 79 (*) 43 - 77 %   Neutro Abs 9.4 (*) 1.7 - 7.7 K/uL   Lymphocytes Relative 13  12 - 46 %   Lymphs Abs 1.5  0.7 - 4.0 K/uL   Monocytes Relative 8  3 - 12 %   Monocytes Absolute 1.0  0.1 - 1.0 K/uL   Eosinophils Relative 0  0 - 5 %   Eosinophils Absolute 0.0  0.0 - 0.7 K/uL   Basophils Relative 0  0 - 1 %   Basophils Absolute 0.0  0.0 - 0.1 K/uL  COMPREHENSIVE METABOLIC PANEL      Result Value Ref Range   Sodium 133 (*) 137 - 147 mEq/L   Potassium 3.8  3.7 - 5.3 mEq/L   Chloride 95 (*) 96 - 112 mEq/L   CO2 25  19 - 32 mEq/L   Glucose, Bld 126 (*) 70 - 99 mg/dL   BUN 13  6 - 23 mg/dL   Creatinine, Ser 2.95  0.50 - 1.10 mg/dL   Calcium 62.1  8.4 - 30.8 mg/dL   Total Protein 6.9  6.0 - 8.3 g/dL   Albumin 3.7  3.5 - 5.2 g/dL   AST 15  0 - 37 U/L   ALT 9  0 - 35 U/L   Alkaline Phosphatase 91  39 - 117 U/L   Total Bilirubin 0.8  0.3 - 1.2 mg/dL   GFR calc non Af Amer 87 (*) >90 mL/min   GFR calc Af Amer >90  >90 mL/min   Anion gap 13  5 - 15  LIPASE, BLOOD      Result Value Ref Range   Lipase 18  11 - 59 U/L  LACTIC ACID, PLASMA      Result Value Ref Range   Lactic Acid, Venous 1.3  0.5 - 2.2 mmol/L  POC OCCULT BLOOD, ED      Result Value Ref Range    Fecal Occult Bld NEGATIVE  NEGATIVE   Ct Abdomen Pelvis W Contrast 01/30/2014   CLINICAL DATA:  Pain.  EXAM: CT ABDOMEN AND PELVIS WITH CONTRAST  TECHNIQUE: Multidetector CT imaging of the abdomen and pelvis was performed using the standard protocol following bolus administration of intravenous contrast.  CONTRAST:  OMNIPAQUE IOHEXOL 300 MG/ML  SOLN  COMPARISON:  CT 08/10/2013.  FINDINGS: No focal hepatic abnormality. Tiny calcifications in spleen consistent with granulomas. Pancreas is unremarkable. The common bile duct and intrahepatic ducts are distended. Although this could be from cholecystectomy other etiologies cannot be excluded. Although no obstructing lesion is identified MRCP should be considered for further evaluation given the degree of biliary distention. Common bile duct measures 16 mm in diameter.  Adrenals are normal. No focal renal abnormality. No hydronephrosis or obstructing ureteral stone. Bladder is nondistended. Hysterectomy. No adnexal mass. Small amount of free pelvic fluid.  Shotty inguinal retroperitoneal lymph nodes. Abdominal aorta is atherosclerotic. No aneurysm. Visceral vessels are patent. Bilateral renal artery atherosclerotic vascular changes are present. Portal vein is present.  The appendix is difficult to visualize. No inflammatory change in right lower quadrant. Stool is noted throughout the colon. The colon is nondistended. Multiple dilated loops of small bowel noted. Distal small bowel is decompressed. These findings are consistent with small bowel obstruction. No free air or pneumatosis. No portal venous air. No hernia.  Cardiomegaly. Coronary artery disease. Mild basilar atelectasis and/or scarring.  Severe degenerative changes and scoliosis lumbar spine.  IMPRESSION: 1. Mid to distal small bowel obstruction. 2. Prominent distention of the common bile duct and intrahepatic ducts. Although this could be from cholecystectomy MRCP should be considered for further  evaluation.   Electronically Signed   By: Maisie Fushomas  Register   On: 01/30/2014 14:44   Dg Chest Port 1 View 01/30/2014   CLINICAL DATA:  COPD, pain  EXAM: PORTABLE CHEST - 1 VIEW  COMPARISON:  09/28/2013  FINDINGS: The cardiac shadow is enlarged. The lungs are clear bilaterally. Chronic changes in the left shoulder joint are seen. Postsurgical changes in the right shoulder joint are noted.  IMPRESSION: No acute abnormality seen.   Electronically Signed   By: Alcide CleverMark  Lukens M.D.   On: 01/30/2014 14:41    1530:  No fever while in ED, VS remain stable. +SBO on CT scan, will admit. Dx and testing d/w pt and family.  Questions answered.  Verb understanding, agreeable to admit.  T/C to [facility] Dr. Jerral RalphGhimire, case discussed, including:  HPI, pertinent PM/SHx, VS/PE, dx testing, ED course and treatment:  Agreeable to admit, requests to consult General Surgery, write temporary orders, obtain medical bed.  T/C to General Surgery Dr. Lovell SheehanJenkins, case discussed, including:  HPI, pertinent PM/SHx, VS/PE, dx testing, ED course and treatment:  Agreeable to consult.    Laray AngerKathleen M Anacarolina Evelyn, DO 01/31/14 [phone]

## 2014-01-30 NOTE — ED Notes (Signed)
Pt c/o lower abd pain with n/v since last night.  Denies diarrhea.  LBM was today.

## 2014-01-31 ENCOUNTER — Inpatient Hospital Stay (HOSPITAL_COMMUNITY): Payer: Medicare Other

## 2014-01-31 LAB — GI PATHOGEN PANEL BY PCR, STOOL
C difficile toxin A/B: NEGATIVE
Campylobacter by PCR: NEGATIVE
Cryptosporidium by PCR: NEGATIVE
E COLI 0157 BY PCR: NEGATIVE
E coli (ETEC) LT/ST: NEGATIVE
E coli (STEC): NEGATIVE
G LAMBLIA BY PCR: NEGATIVE
NOROVIRUS G1/G2: NEGATIVE
ROTAVIRUS A BY PCR: NEGATIVE
SHIGELLA BY PCR: NEGATIVE
Salmonella by PCR: NEGATIVE

## 2014-01-31 LAB — BASIC METABOLIC PANEL
Anion gap: 9 (ref 5–15)
BUN: 10 mg/dL (ref 6–23)
CALCIUM: 8.8 mg/dL (ref 8.4–10.5)
CHLORIDE: 102 meq/L (ref 96–112)
CO2: 24 mEq/L (ref 19–32)
CREATININE: 0.69 mg/dL (ref 0.50–1.10)
GFR calc Af Amer: >90 mL/min (ref >90)
GFR calc non Af Amer: 86 mL/min — ABNORMAL LOW (ref >90)
GLUCOSE: 89 mg/dL (ref 70–99)
POTASSIUM: 3.6 meq/L — AB (ref 3.7–5.3)
SODIUM: 135 meq/L — AB (ref 137–147)

## 2014-01-31 LAB — CBC
HEMATOCRIT: 34.3 % — AB (ref 36.0–46.0)
Hemoglobin: 11.5 g/dL — ABNORMAL LOW (ref 12.0–15.0)
MCH: 29.4 pg (ref 26.0–34.0)
MCHC: 33.5 g/dL (ref 30.0–36.0)
MCV: 87.7 fL (ref 78.0–100.0)
Platelets: 216 10*3/uL (ref 150–400)
RBC: 3.91 MIL/uL (ref 3.87–5.11)
RDW: 14.6 % (ref 11.5–15.5)
WBC: 6.3 10*3/uL (ref 4.0–10.5)

## 2014-01-31 MED ORDER — MUPIROCIN 2 % EX OINT
1.0000 "application " | TOPICAL_OINTMENT | Freq: Two times a day (BID) | CUTANEOUS | Status: DC
  Administered 2014-01-31 – 2014-02-04 (×9): 1 via NASAL
  Filled 2014-01-31: qty 22

## 2014-01-31 MED ORDER — HYDRALAZINE HCL 20 MG/ML IJ SOLN
20.0000 mg | INTRAMUSCULAR | Status: DC | PRN
  Administered 2014-01-31 – 2014-02-03 (×3): 20 mg via INTRAVENOUS
  Filled 2014-01-31 (×3): qty 1

## 2014-01-31 MED ORDER — CHLORHEXIDINE GLUCONATE CLOTH 2 % EX PADS
6.0000 | MEDICATED_PAD | Freq: Every day | CUTANEOUS | Status: AC
  Administered 2014-01-31 – 2014-02-04 (×5): 6 via TOPICAL

## 2014-01-31 NOTE — Progress Notes (Signed)
UR chart review completed.  

## 2014-01-31 NOTE — Progress Notes (Signed)
Veronica Key RUE:[id number] DOB: [birth year] DOA: 01/30/2014 PCP: Milana Obey, MD   Subjective: This lady was admitted yesterday with what appears to be small bowel obstruction. She is being treated conservatively with n.p.o. and NG tube.           Physical Exam: Blood pressure 180/75, pulse 69, temperature 98.3 F (36.8 C), temperature source Oral, resp. rate 20, height 4\' 11"  (1.499 m), weight 65.325 kg (144 lb 0.3 oz), SpO2 94.00%. She looks like a she is in some pain. Heart sounds are present without murmurs. Lung fields clear. Abdomen is soft and mildly tender. Bowel sounds are heard. She is alert and orientated without any focal neurologic signs.   Investigations:  Recent Results (from the past 240 hour(s))  MRSA PCR SCREENING     Status: Abnormal   Collection Time    01/30/14  6:53 PM      Result Value Ref Range Status   MRSA by PCR POSITIVE (*) NEGATIVE Final   Comment:            The GeneXpert MRSA Assay (FDA     approved for NASAL specimens     only), is one component of a     comprehensive MRSA colonization     surveillance program. It is not     intended to diagnose MRSA     infection nor to guide or     monitor treatment for     MRSA infections.     RESULT CALLED TO, READ BACK BY AND VERIFIED WITH:     BRAGGS,D ON 01/30/14 AT 2150 BY LOY,C  CLOSTRIDIUM DIFFICILE BY PCR     Status: None   Collection Time    01/30/14  8:31 PM      Result Value Ref Range Status   C difficile by pcr NEGATIVE  NEGATIVE Final     Basic Metabolic Panel:  Recent Labs  14/78/29 1230 01/31/14 0550  NA 133* 135*  K 3.8 3.6*  CL 95* 102  CO2 25 24  GLUCOSE 126* 89  BUN 13 10  CREATININE 0.65 0.69  CALCIUM 10.2 8.8   Liver Function Tests:  Recent Labs  01/30/14 1230  AST 15  ALT 9  ALKPHOS 91  BILITOT 0.8  PROT 6.9  ALBUMIN 3.7     CBC:  Recent Labs  01/30/14 1230 01/31/14 0550  WBC 12.0* 6.3  NEUTROABS 9.4*  --   HGB 13.5 11.5*  HCT 39.4 34.3*   MCV 85.8 87.7  PLT 278 216    Dg Abd 1 View  01/30/2014   CLINICAL DATA:  EMESIS ABDOMINAL PAIN  EXAM: ABDOMEN - 1 VIEW  COMPARISON:  CT abdomen pelvis dated 01/30/2014.  FINDINGS: An enteric tube is been inserted coursing along the expected region of the stomach. Tip projecting in the region of the distal stomach. Air within dilated loops of small bowel in distended loops of large bowel appear degenerative changes within the lumbar spine.  IMPRESSION: Patient is status post enteric tube insertion. Findings again consistent with small bowel obstruction.   Electronically Signed   By: Salome Holmes M.D.   On: 01/30/2014 16:54   Ct Abdomen Pelvis W Contrast  01/30/2014   CLINICAL DATA:  Pain.  EXAM: CT ABDOMEN AND PELVIS WITH CONTRAST  TECHNIQUE: Multidetector CT imaging of the abdomen and pelvis was performed using the standard protocol following bolus administration of intravenous contrast.  CONTRAST:  OMNIPAQUE IOHEXOL 300 MG/ML  SOLN  COMPARISON:  CT 08/10/2013.  FINDINGS: No focal hepatic abnormality. Tiny calcifications in spleen consistent with granulomas. Pancreas is unremarkable. The common bile duct and intrahepatic ducts are distended. Although this could be from cholecystectomy other etiologies cannot be excluded. Although no obstructing lesion is identified MRCP should be considered for further evaluation given the degree of biliary distention. Common bile duct measures 16 mm in diameter.  Adrenals are normal. No focal renal abnormality. No hydronephrosis or obstructing ureteral stone. Bladder is nondistended. Hysterectomy. No adnexal mass. Small amount of free pelvic fluid.  Shotty inguinal retroperitoneal lymph nodes. Abdominal aorta is atherosclerotic. No aneurysm. Visceral vessels are patent. Bilateral renal artery atherosclerotic vascular changes are present. Portal vein is present.  The appendix is difficult to visualize. No inflammatory change in right lower quadrant. Stool is noted  throughout the colon. The colon is nondistended. Multiple dilated loops of small bowel noted. Distal small bowel is decompressed. These findings are consistent with small bowel obstruction. No free air or pneumatosis. No portal venous air. No hernia.  Cardiomegaly. Coronary artery disease. Mild basilar atelectasis and/or scarring. Severe degenerative changes and scoliosis lumbar spine.  IMPRESSION: 1. Mid to distal small bowel obstruction. 2. Prominent distention of the common bile duct and intrahepatic ducts. Although this could be from cholecystectomy MRCP should be considered for further evaluation.   Electronically Signed   By: Maisie Fushomas  Register   On: 01/30/2014 14:44   Dg Chest Port 1 View  01/30/2014   CLINICAL DATA:  COPD, pain  EXAM: PORTABLE CHEST - 1 VIEW  COMPARISON:  09/28/2013  FINDINGS: The cardiac shadow is enlarged. The lungs are clear bilaterally. Chronic changes in the left shoulder joint are seen. Postsurgical changes in the right shoulder joint are noted.  IMPRESSION: No acute abnormality seen.   Electronically Signed   By: Alcide CleverMark  Lukens M.D.   On: 01/30/2014 14:41   Dg Abd 2 Views  01/31/2014   CLINICAL DATA:  Abdominal pain.  EXAM: ABDOMEN - 2 VIEW  COMPARISON:  01/30/2014.  FINDINGS: Portable study at 0819 hrs. Upright film shows no evidence for intraperitoneal free air. Supine film shows interval decrease in the diffuse gaseous small bowel distention. The degree of colonic distention appears relatively stable. There does appear to be contrast material in the colonic lumen suggesting the oral contrast for the CT scan from yesterday has migrated through the small bowel loops. Contrast in the urinary bladder is compatible with a IV contrast excretion from yesterday's CT scan NG tube tip is in the antral pyloric region and may be just transpyloric.  IMPRESSION: Interval decrease in gaseous small bowel dilatation with stable colonic distention.   Electronically Signed   By: Kennith CenterEric  Mansell M.D.    On: 01/31/2014 08:40      Medications: I have reviewed the patient's current medications.  Impression: 1. Small bowel obstruction, radiologically improving. 2. Hypertension, uncontrolled. 3. Anxiety and depression.     Plan: 1. Continue with conservative management with intravenous fluids, n.p.o. and NG tube. 2. Increase intravenous when necessary hydralazine for better blood pressure control.     Procedures:  None.   Antibiotics:  None.                   Code Status: Full code.  Family Communication: I discussed the plan with patient at the bedside.   Disposition Plan: Home when medically stable.  Time spent: 15 minutes.   LOS: 1 day   GOSRANI,NIMISH C   01/31/2014, 7:29 PM

## 2014-01-31 NOTE — Progress Notes (Signed)
INITIAL NUTRITION ASSESSMENT  DOCUMENTATION CODES Per approved criteria  -Not Applicable   INTERVENTION: Follow diet advancement  NUTRITION DIAGNOSIS: Inadequte oral intake related to altered GI function as evidenced by vomiting, abdominal pain PTA and  NPO status .   Goal: Pt to meet >/= 90% of their estimated nutrition needs     Monitor: Diet advancement, po intake, labs and wt trends    Reason for Assessment: Malnutrition Screen Score =  5  73 y.o. female  Admitting Dx: SBO (small bowel obstruction)  ASSESSMENT:  In March pt underwent  right L1-2 lumbar laminotomy and discectomy, and repair of CSF leak. She was discharged to SNF. [facility] 171#. She has  weight loss of 27#, 16% in the past 4 months. Pt says "I just gave up". Poor appetite since hospitalization in March and unintentional weight loss. Pt has hx of depression. Question eitology possible depression related?  Pt presents with c/o abdominal pain, vomiting. Currently pt has NGT in right nare with low-intermittent suction. She is moving her bowels. No surgery indicated at this time per MD note.   Height: Ht Readings from Last 1 Encounters:  01/30/14 4\' 11"  (1.499 m)    Weight: Wt Readings from Last 1 Encounters:  01/30/14 144 lb 0.3 oz (65.325 kg)    Ideal Body Weight: 98#  % Ideal Body Weight: 147%  Wt Readings from Last 10 Encounters:  01/30/14 144 lb 0.3 oz (65.325 kg)  10/03/13 171 lb 1.2 oz (77.6 kg)  10/03/13 171 lb 1.2 oz (77.6 kg)  09/28/13 160 lb 1.6 oz (72.621 kg)  02/16/08 188 lb 4 oz (85.39 kg)    Usual Body Weight: 160-170#  % Usual Body Weight: 84%  BMI:  Body mass index is 29.07 kg/(m^2).overweight  Estimated Nutritional Needs: Kcal: 1600-1800 Protein: 75-85 gr Fluid: 1800 ml daily  Skin: intact  Diet Order: NPO  EDUCATION NEEDS: -No education needs identified at this time   Intake/Output Summary (Last 24 hours) at 01/31/14 1101 Last data  filed at 01/31/14 0900  Gross per 24 hour  Intake 1151.67 ml  Output      0 ml  Net 1151.67 ml    Last BM:  01/30/14 large, loose stool and abdomen is soft and distended  Labs:   Recent Labs Lab 01/30/14 1230 01/31/14 0550  NA 133* 135*  K 3.8 3.6*  CL 95* 102  CO2 25 24  BUN 13 10  CREATININE 0.65 0.69  CALCIUM 10.2 8.8  GLUCOSE 126* 89    CBG (last 3)  No results found for this basename: GLUCAP,  in the last 72 hours  Scheduled Meds: . Chlorhexidine Gluconate Cloth  6 each Topical Q0600  . diazepam  5 mg Oral BID  . heparin  5,000 Units Subcutaneous 3 times per day  . metoprolol  2.5 mg Intravenous 4 times per day  . mupirocin ointment  1 application Nasal BID  . pantoprazole (PROTONIX) IV  40 mg Intravenous Q24H    Continuous Infusions: . sodium chloride 100 mL/hr at 01/31/14 1043    Past Medical History  Diagnosis Date  . GERD (gastroesophageal reflux disease)     takes Protonix daily  . Urinary frequency     takes Ditropan daily  . Depression     takes Cymbalta daily  . Anxiety     takes Valium daily  . COPD (chronic obstructive pulmonary disease)     uses ALbuterol prn daily  .  Hypertension     takes Amlodipine,Benazepril,and Labetalol daily  . Shortness of breath     with exertion  . History of bronchitis     last time in 1976  . Pneumonia     hx of-many yrs ago  . Headache(784.0)     occasionally  . Numbness     both feet  . Arthritis   . Joint pain   . Peripheral edema     but not on any meds  . Chronic back pain     herniated disc and stenosis  . Carpal tunnel syndrome   . H/O hiatal hernia   . History of gastric ulcer   . Urinary urgency   . History of kidney stones     Past Surgical History  Procedure Laterality Date  . Cholecystectomy    . Abdominal hysterectomy    . Bladder tacked    . Shoulder surgery Bilateral   . Joint replacement Bilateral     knee   . Cataract surgery Bilateral   . Epidural block injection    .  Esophagogastroduodenoscopy    . Lumbar laminectomy/decompression microdiscectomy N/A 10/02/2013    Procedure: LUMBAR ONE-TWO LAMINECTOMY/DECOMPRESSION MICRODISCECTOMY ;  Surgeon: Barnett Abu, MD;  Location: [facility];  Service: Neurosurgery;  Laterality: N/A;  L1-2 Laminectomy    Royann Shivers MS,RD,CSG,LDN Office: [phone] Pager: [phone]

## 2014-01-31 NOTE — Consult Note (Addendum)
Reason for Consult: Small bowel obstruction Referring Physician: Dr. [name] is an 73 y.o. female.  HPI: Patient is a 73 year old white female presented emergency room with worsening nausea and vomiting. CT scan the abdomen and pelvis was performed which revealed a small bowel obstruction. An NG tube was placed and the patient was admitted to the hospital for further evaluation treatment. Since her admission, she has had multiple bowel movements. A KUB this morning reveals contrast within the descending colon. She still has bowel dilatation. She states her abdominal distention is somewhat better. She still has crampy abdominal pain. She has had multiple surgeries in the past. She denies any previous episodes of bowel obstruction.  Past Medical History  Diagnosis Date  . GERD (gastroesophageal reflux disease)     takes Protonix daily  . Urinary frequency     takes Ditropan daily  . Depression     takes Cymbalta daily  . Anxiety     takes Valium daily  . COPD (chronic obstructive pulmonary disease)     uses ALbuterol prn daily  . Hypertension     takes Amlodipine,Benazepril,and Labetalol daily  . Shortness of breath     with exertion  . History of bronchitis     last time in 1976  . Pneumonia     hx of-many yrs ago  . Headache(784.0)     occasionally  . Numbness     both feet  . Arthritis   . Joint pain   . Peripheral edema     but not on any meds  . Chronic back pain     herniated disc and stenosis  . Carpal tunnel syndrome   . H/O hiatal hernia   . History of gastric ulcer   . Urinary urgency   . History of kidney stones     Past Surgical History  Procedure Laterality Date  . Cholecystectomy    . Abdominal hysterectomy    . Bladder tacked    . Shoulder surgery Bilateral   . Joint replacement Bilateral     knee   . Cataract surgery Bilateral   . Epidural block injection    . Esophagogastroduodenoscopy    . Lumbar laminectomy/decompression  microdiscectomy N/A 10/02/2013    Procedure: LUMBAR ONE-TWO LAMINECTOMY/DECOMPRESSION MICRODISCECTOMY ;  Surgeon: Kristeen Miss, MD;  Location: [facility];  Service: Neurosurgery;  Laterality: N/A;  L1-2 Laminectomy    History reviewed. No pertinent family history.  Social History:  reports that she has been smoking.  She does not have any smokeless tobacco history on file. She reports that she does not drink alcohol or use illicit drugs.  Allergies:  Allergies  Allergen Reactions  . Adhesive [Tape]     Itching and redness around site;Pt requests paper tape  . Cephalexin Nausea And Vomiting    Medications: I have reviewed the patient's current medications.  Results for orders placed during the hospital encounter of 01/30/14 (from the past 48 hour(s))  CBC WITH DIFFERENTIAL     Status: Abnormal   Collection Time    01/30/14 12:30 PM      Result Value Ref Range   WBC 12.0 (*) 4.0 - 10.5 K/uL   RBC 4.59  3.87 - 5.11 MIL/uL   Hemoglobin 13.5  12.0 - 15.0 g/dL   HCT 39.4  36.0 - 46.0 %   MCV 85.8  78.0 - 100.0 fL   MCH 29.4  26.0 - 34.0 pg   MCHC 34.3  30.0 -  36.0 g/dL   RDW 14.3  11.5 - 15.5 %   Platelets 278  150 - 400 K/uL   Neutrophils Relative % 79 (*) 43 - 77 %   Neutro Abs 9.4 (*) 1.7 - 7.7 K/uL   Lymphocytes Relative 13  12 - 46 %   Lymphs Abs 1.5  0.7 - 4.0 K/uL   Monocytes Relative 8  3 - 12 %   Monocytes Absolute 1.0  0.1 - 1.0 K/uL   Eosinophils Relative 0  0 - 5 %   Eosinophils Absolute 0.0  0.0 - 0.7 K/uL   Basophils Relative 0  0 - 1 %   Basophils Absolute 0.0  0.0 - 0.1 K/uL  COMPREHENSIVE METABOLIC PANEL     Status: Abnormal   Collection Time    01/30/14 12:30 PM      Result Value Ref Range   Sodium 133 (*) 137 - 147 mEq/L   Potassium 3.8  3.7 - 5.3 mEq/L   Chloride 95 (*) 96 - 112 mEq/L   CO2 25  19 - 32 mEq/L   Glucose, Bld 126 (*) 70 - 99 mg/dL   BUN 13  6 - 23 mg/dL   Creatinine, Ser 0.65  0.50 - 1.10 mg/dL   Calcium 10.2  8.4 - 10.5 mg/dL   Total  Protein 6.9  6.0 - 8.3 g/dL   Albumin 3.7  3.5 - 5.2 g/dL   AST 15  0 - 37 U/L   ALT 9  0 - 35 U/L   Alkaline Phosphatase 91  39 - 117 U/L   Total Bilirubin 0.8  0.3 - 1.2 mg/dL   GFR calc non Af Amer 87 (*) >90 mL/min   GFR calc Af Amer >90  >90 mL/min   Comment: (NOTE)     The eGFR has been calculated using the CKD EPI equation.     This calculation has not been validated in all clinical situations.     eGFR's persistently <90 mL/min signify possible Chronic Kidney     Disease.   Anion gap 13  5 - 15  LIPASE, BLOOD     Status: None   Collection Time    01/30/14 12:30 PM      Result Value Ref Range   Lipase 18  11 - 59 U/L  LACTIC ACID, PLASMA     Status: None   Collection Time    01/30/14 12:30 PM      Result Value Ref Range   Lactic Acid, Venous 1.3  0.5 - 2.2 mmol/L  POC OCCULT BLOOD, ED     Status: None   Collection Time    01/30/14  2:38 PM      Result Value Ref Range   Fecal Occult Bld NEGATIVE  NEGATIVE  URINALYSIS, ROUTINE W REFLEX MICROSCOPIC     Status: Abnormal   Collection Time    01/30/14  3:05 PM      Result Value Ref Range   Color, Urine YELLOW  YELLOW   APPearance CLEAR  CLEAR   Specific Gravity, Urine 1.010  1.005 - 1.030   pH 8.0  5.0 - 8.0   Glucose, UA NEGATIVE  NEGATIVE mg/dL   Hgb urine dipstick TRACE (*) NEGATIVE   Bilirubin Urine NEGATIVE  NEGATIVE   Ketones, ur NEGATIVE  NEGATIVE mg/dL   Protein, ur NEGATIVE  NEGATIVE mg/dL   Urobilinogen, UA 0.2  0.0 - 1.0 mg/dL   Nitrite NEGATIVE  NEGATIVE   Leukocytes, UA LARGE (*)  NEGATIVE  URINE MICROSCOPIC-ADD ON     Status: Abnormal   Collection Time    01/30/14  3:05 PM      Result Value Ref Range   Squamous Epithelial / LPF RARE  RARE   WBC, UA 21-50  <3 WBC/hpf   RBC / HPF 0-2  <3 RBC/hpf   Bacteria, UA MANY (*) RARE  MRSA PCR SCREENING     Status: Abnormal   Collection Time    01/30/14  6:53 PM      Result Value Ref Range   MRSA by PCR POSITIVE (*) NEGATIVE   Comment:            The  GeneXpert MRSA Assay (FDA     approved for NASAL specimens     only), is one component of a     comprehensive MRSA colonization     surveillance program. It is not     intended to diagnose MRSA     infection nor to guide or     monitor treatment for     MRSA infections.     RESULT CALLED TO, READ BACK BY AND VERIFIED WITH:     BRAGGS,D ON 01/30/14 AT 2150 BY LOY,C  CLOSTRIDIUM DIFFICILE BY PCR     Status: None   Collection Time    01/30/14  8:31 PM      Result Value Ref Range   C difficile by pcr NEGATIVE  NEGATIVE  BASIC METABOLIC PANEL     Status: Abnormal   Collection Time    01/31/14  5:50 AM      Result Value Ref Range   Sodium 135 (*) 137 - 147 mEq/L   Potassium 3.6 (*) 3.7 - 5.3 mEq/L   Chloride 102  96 - 112 mEq/L   CO2 24  19 - 32 mEq/L   Glucose, Bld 89  70 - 99 mg/dL   BUN 10  6 - 23 mg/dL   Creatinine, Ser 0.69  0.50 - 1.10 mg/dL   Calcium 8.8  8.4 - 10.5 mg/dL   GFR calc non Af Amer 86 (*) >90 mL/min   GFR calc Af Amer >90  >90 mL/min   Comment: (NOTE)     The eGFR has been calculated using the CKD EPI equation.     This calculation has not been validated in all clinical situations.     eGFR's persistently <90 mL/min signify possible Chronic Kidney     Disease.   Anion gap 9  5 - 15  CBC     Status: Abnormal   Collection Time    01/31/14  5:50 AM      Result Value Ref Range   WBC 6.3  4.0 - 10.5 K/uL   RBC 3.91  3.87 - 5.11 MIL/uL   Hemoglobin 11.5 (*) 12.0 - 15.0 g/dL   HCT 34.3 (*) 36.0 - 46.0 %   MCV 87.7  78.0 - 100.0 fL   MCH 29.4  26.0 - 34.0 pg   MCHC 33.5  30.0 - 36.0 g/dL   RDW 14.6  11.5 - 15.5 %   Platelets 216  150 - 400 K/uL    Dg Abd 1 View  01/30/2014   CLINICAL DATA:  EMESIS ABDOMINAL PAIN  EXAM: ABDOMEN - 1 VIEW  COMPARISON:  CT abdomen pelvis dated 01/30/2014.  FINDINGS: An enteric tube is been inserted coursing along the expected region of the stomach. Tip projecting in the region of the distal stomach. Air within  dilated loops of small  bowel in distended loops of large bowel appear degenerative changes within the lumbar spine.  IMPRESSION: Patient is status post enteric tube insertion. Findings again consistent with small bowel obstruction.   Electronically Signed   By: Margaree Mackintosh M.D.   On: 01/30/2014 16:54   Ct Abdomen Pelvis W Contrast  01/30/2014   CLINICAL DATA:  Pain.  EXAM: CT ABDOMEN AND PELVIS WITH CONTRAST  TECHNIQUE: Multidetector CT imaging of the abdomen and pelvis was performed using the standard protocol following bolus administration of intravenous contrast.  CONTRAST:  143m OMNIPAQUE IOHEXOL 300 MG/ML  SOLN  COMPARISON:  CT 08/10/2013.  FINDINGS: No focal hepatic abnormality. Tiny calcifications in spleen consistent with granulomas. Pancreas is unremarkable. The common bile duct and intrahepatic ducts are distended. Although this could be from cholecystectomy other etiologies cannot be excluded. Although no obstructing lesion is identified MRCP should be considered for further evaluation given the degree of biliary distention. Common bile duct measures 16 mm in diameter.  Adrenals are normal. No focal renal abnormality. No hydronephrosis or obstructing ureteral stone. Bladder is nondistended. Hysterectomy. No adnexal mass. Small amount of free pelvic fluid.  Shotty inguinal retroperitoneal lymph nodes. Abdominal aorta is atherosclerotic. No aneurysm. Visceral vessels are patent. Bilateral renal artery atherosclerotic vascular changes are present. Portal vein is present.  The appendix is difficult to visualize. No inflammatory change in right lower quadrant. Stool is noted throughout the colon. The colon is nondistended. Multiple dilated loops of small bowel noted. Distal small bowel is decompressed. These findings are consistent with small bowel obstruction. No free air or pneumatosis. No portal venous air. No hernia.  Cardiomegaly. Coronary artery disease. Mild basilar atelectasis and/or scarring. Severe degenerative  changes and scoliosis lumbar spine.  IMPRESSION: 1. Mid to distal small bowel obstruction. 2. Prominent distention of the common bile duct and intrahepatic ducts. Although this could be from cholecystectomy MRCP should be considered for further evaluation.   Electronically Signed   By: TMarcello Moores Register   On: 01/30/2014 14:44   Dg Chest Port 1 View  01/30/2014   CLINICAL DATA:  COPD, pain  EXAM: PORTABLE CHEST - 1 VIEW  COMPARISON:  09/28/2013  FINDINGS: The cardiac shadow is enlarged. The lungs are clear bilaterally. Chronic changes in the left shoulder joint are seen. Postsurgical changes in the right shoulder joint are noted.  IMPRESSION: No acute abnormality seen.   Electronically Signed   By: MInez CatalinaM.D.   On: 01/30/2014 14:41    ROS: See chart Blood pressure 165/74, pulse 61, temperature 98.3 F (36.8 C), temperature source Oral, resp. rate 20, height _0  (1.499 m), weight 65.325 kg (144 lb 0.3 oz), SpO2 91.00%. Physical Exam: Pleasant white female in no acute distress. Abdomen is time and mildly distended, soft. No rigidity is noted. No hernias are noted. Multiple surgical scars are present.  Assessment/Plan: Impression: Partial small bowel obstruction. No need for acute surgical intervention at this time. Patient does have urinary tract infection which is being treated at this time. Cultures are pending. Plan: Continue NG tube decompression for now. Will follow with you.  Veronica Key A 01/31/2014, 8:38 AM

## 2014-02-01 LAB — COMPREHENSIVE METABOLIC PANEL
ALBUMIN: 3.4 g/dL — AB (ref 3.5–5.2)
ALT: 9 U/L (ref 0–35)
ANION GAP: 16 — AB (ref 5–15)
AST: 15 U/L (ref 0–37)
Alkaline Phosphatase: 94 U/L (ref 39–117)
BILIRUBIN TOTAL: 1 mg/dL (ref 0.3–1.2)
BUN: 8 mg/dL (ref 6–23)
CO2: 24 mEq/L (ref 19–32)
CREATININE: 0.5 mg/dL (ref 0.50–1.10)
Calcium: 9.3 mg/dL (ref 8.4–10.5)
Chloride: 96 mEq/L (ref 96–112)
GFR calc Af Amer: >90 mL/min (ref >90)
GFR calc non Af Amer: >90 mL/min (ref >90)
Glucose, Bld: 73 mg/dL (ref 70–99)
POTASSIUM: 3 meq/L — AB (ref 3.7–5.3)
Sodium: 136 mEq/L — ABNORMAL LOW (ref 137–147)
TOTAL PROTEIN: 6.8 g/dL (ref 6.0–8.3)

## 2014-02-01 LAB — CBC
HCT: 40.5 % (ref 36.0–46.0)
Hemoglobin: 13.5 g/dL (ref 12.0–15.0)
MCH: 29 pg (ref 26.0–34.0)
MCHC: 33.3 g/dL (ref 30.0–36.0)
MCV: 86.9 fL (ref 78.0–100.0)
Platelets: 249 10*3/uL (ref 150–400)
RBC: 4.66 MIL/uL (ref 3.87–5.11)
RDW: 14.3 % (ref 11.5–15.5)
WBC: 8 10*3/uL (ref 4.0–10.5)

## 2014-02-01 MED ORDER — POTASSIUM CHLORIDE IN NACL 40-0.9 MEQ/L-% IV SOLN
INTRAVENOUS | Status: DC
  Administered 2014-02-01 (×2): 100 mL/h via INTRAVENOUS
  Administered 2014-02-02: 10 mL/h via INTRAVENOUS

## 2014-02-01 MED ORDER — METOPROLOL TARTRATE 1 MG/ML IV SOLN
5.0000 mg | Freq: Four times a day (QID) | INTRAVENOUS | Status: DC
Start: 2014-02-01 — End: 2014-02-04
  Administered 2014-02-01 – 2014-02-04 (×12): 5 mg via INTRAVENOUS
  Filled 2014-02-01 (×12): qty 5

## 2014-02-01 NOTE — Progress Notes (Signed)
Veronica Key L Veronica Key:[id number]:[id number] DOB: 01/24/1941 DOA: 01/30/2014 PCP: [name],[name], MD   Subjective: This lady was admitted  with what appears to be small bowel obstruction. She is being treated conservatively with n.p.o. and NG tube.           Physical Exam: Blood pressure 178/63, pulse 78, temperature 97.6 F (36.4 C), temperature source Oral, resp. rate 20, height 4\' 11"  (1.499 m), weight 65.325 kg (144 lb 0.3 oz), SpO2 100.00%. She looks like a she is in some pain. Heart sounds are present without murmurs. Lung fields clear. Abdomen is soft and mildly tender. Bowel sounds are heard. She is alert and orientated without any focal neurologic signs.   Investigations:  Recent Results (from the past 240 hour(s))  URINE CULTURE     Status: None   Collection Time    01/30/14  3:05 PM      Result Value Ref Range Status   Specimen Description URINE, CLEAN CATCH   Final   Special Requests NONE   Final   Culture  Setup Time     Final   Value: 01/31/2014 04:28     [facility]   Colony Count PENDING   Incomplete   Culture     Final   Value: Culture reincubated for better growth     [facility]   Report Status PENDING   Incomplete  MRSA PCR SCREENING     Status: Abnormal   Collection Time    01/30/14  6:53 PM      Result Value Ref Range Status   MRSA by PCR POSITIVE (*) NEGATIVE Final   Comment:            The GeneXpert MRSA Assay (FDA     approved for NASAL specimens     only), is one component of a     comprehensive MRSA colonization     surveillance program. It is not     intended to diagnose MRSA     infection nor to guide or     monitor treatment for     MRSA infections.     RESULT CALLED TO, READ BACK BY AND VERIFIED WITH:     BRAGGS,D ON 01/30/14 AT 2150 BY LOY,C  CLOSTRIDIUM DIFFICILE BY PCR     Status: None   Collection Time    01/30/14  8:31 PM      Result Value Ref Range Status   C difficile by pcr NEGATIVE  NEGATIVE  Final     Basic Metabolic Panel:  Recent Labs  78/46/9606/04/09 0550 02/01/14 0518  NA 135* 136*  K 3.6* 3.0*  CL 102 96  CO2 24 24  GLUCOSE 89 73  BUN 10 8  CREATININE 0.69 0.50  CALCIUM 8.8 9.3   Liver Function Tests:  Recent Labs  01/30/14 1230 02/01/14 0518  AST 15 15  ALT 9 9  ALKPHOS 91 94  BILITOT 0.8 1.0  PROT 6.9 6.8  ALBUMIN 3.7 3.4*     CBC:  Recent Labs  01/30/14 1230 01/31/14 0550 02/01/14 0518  WBC 12.0* 6.3 8.0  NEUTROABS 9.4*  --   --   HGB 13.5 11.5* 13.5  HCT 39.4 34.3* 40.5  MCV 85.8 87.7 86.9  PLT 278 216 249    Dg Abd 1 View  01/30/2014   CLINICAL DATA:  EMESIS ABDOMINAL PAIN  EXAM: ABDOMEN - 1 VIEW  COMPARISON:  CT abdomen pelvis dated 01/30/2014.  FINDINGS: An  enteric tube is been inserted coursing along the expected region of the stomach. Tip projecting in the region of the distal stomach. Air within dilated loops of small bowel in distended loops of large bowel appear degenerative changes within the lumbar spine.  IMPRESSION: Patient is status post enteric tube insertion. Findings again consistent with small bowel obstruction.   Electronically Signed   By: Salome Holmes M.D.   On: 01/30/2014 16:54   Ct Abdomen Pelvis W Contrast  01/30/2014   CLINICAL DATA:  Pain.  EXAM: CT ABDOMEN AND PELVIS WITH CONTRAST  TECHNIQUE: Multidetector CT imaging of the abdomen and pelvis was performed using the standard protocol following bolus administration of intravenous contrast.  CONTRAST:  OMNIPAQUE IOHEXOL 300 MG/ML  SOLN  COMPARISON:  CT 08/10/2013.  FINDINGS: No focal hepatic abnormality. Tiny calcifications in spleen consistent with granulomas. Pancreas is unremarkable. The common bile duct and intrahepatic ducts are distended. Although this could be from cholecystectomy other etiologies cannot be excluded. Although no obstructing lesion is identified MRCP should be considered for further evaluation given the degree of biliary distention. Common bile  duct measures 16 mm in diameter.  Adrenals are normal. No focal renal abnormality. No hydronephrosis or obstructing ureteral stone. Bladder is nondistended. Hysterectomy. No adnexal mass. Small amount of free pelvic fluid.  Shotty inguinal retroperitoneal lymph nodes. Abdominal aorta is atherosclerotic. No aneurysm. Visceral vessels are patent. Bilateral renal artery atherosclerotic vascular changes are present. Portal vein is present.  The appendix is difficult to visualize. No inflammatory change in right lower quadrant. Stool is noted throughout the colon. The colon is nondistended. Multiple dilated loops of small bowel noted. Distal small bowel is decompressed. These findings are consistent with small bowel obstruction. No free air or pneumatosis. No portal venous air. No hernia.  Cardiomegaly. Coronary artery disease. Mild basilar atelectasis and/or scarring. Severe degenerative changes and scoliosis lumbar spine.  IMPRESSION: 1. Mid to distal small bowel obstruction. 2. Prominent distention of the common bile duct and intrahepatic ducts. Although this could be from cholecystectomy MRCP should be considered for further evaluation.   Electronically Signed   By: Maisie Fus  Register   On: 01/30/2014 14:44   Dg Chest Port 1 View  01/30/2014   CLINICAL DATA:  COPD, pain  EXAM: PORTABLE CHEST - 1 VIEW  COMPARISON:  09/28/2013  FINDINGS: The cardiac shadow is enlarged. The lungs are clear bilaterally. Chronic changes in the left shoulder joint are seen. Postsurgical changes in the right shoulder joint are noted.  IMPRESSION: No acute abnormality seen.   Electronically Signed   By: Alcide Clever M.D.   On: 01/30/2014 14:41   Dg Abd 2 Views  01/31/2014   CLINICAL DATA:  Abdominal pain.  EXAM: ABDOMEN - 2 VIEW  COMPARISON:  01/30/2014.  FINDINGS: Portable study at 0819 hrs. Upright film shows no evidence for intraperitoneal free air. Supine film shows interval decrease in the diffuse gaseous small bowel distention. The  degree of colonic distention appears relatively stable. There does appear to be contrast material in the colonic lumen suggesting the oral contrast for the CT scan from yesterday has migrated through the small bowel loops. Contrast in the urinary bladder is compatible with a IV contrast excretion from yesterday's CT scan NG tube tip is in the antral pyloric region and may be just transpyloric.  IMPRESSION: Interval decrease in gaseous small bowel dilatation with stable colonic distention.   Electronically Signed   By: Kennith Center M.D.   On: 01/31/2014  08:40      Medications: I have reviewed the patient's current medications.  Impression: 1. Small bowel obstruction, radiologically and clinically improving. 2. Hypertension, uncontrolled. 3. Anxiety and depression.     Plan: 1. Continue with conservative management with intravenous fluids, n.p.o. and NG tube. 2. Increase intravenous when necessary hydralazine for better blood pressure control. 3. Surgical recommendations appreciated.     Procedures:  None.   Antibiotics:  None.                   Code Status: Full code.  Family Communication: I discussed the plan with patient at the bedside.   Disposition Plan: Home when medically stable.  Time spent: 15 minutes.   LOS: 2 days   GOSRANI,NIMISH C   02/01/2014, 9:17 AM

## 2014-02-01 NOTE — Progress Notes (Signed)
Subjective: Has had multiple bowel movements since admission. Complains of crampy abdominal pain.  Objective: Vital signs in last 24 hours: Temp:  [97.6 F (36.4 C)-98.3 F (36.8 C)] 97.6 F (36.4 C) (07/10 91470633) Pulse Rate:  [52-80] 78 (07/10 0633) Resp:  [20] 20 (07/10 0633) BP: (121-195)/(51-75) 178/63 mmHg (07/10 0711) SpO2:  [94 %-100 %] 100 % (07/10 82950633) Last BM Date: 01/31/14  Intake/Output from previous day: 07/09 0701 - 07/10 0700 In: 1361.7 [I.V.:1361.7] Out: -  Intake/Output this shift:    General appearance: alert, cooperative and no distress GI: soft, non-tender; bowel sounds normal; no masses,  no organomegaly  Lab Results:   Recent Labs  01/31/14 0550 02/01/14 0518  WBC 6.3 8.0  HGB 11.5* 13.5  HCT 34.3* 40.5  PLT 216 249   BMET  Recent Labs  01/31/14 0550 02/01/14 0518  NA 135* 136*  K 3.6* 3.0*  CL 102 96  CO2 24 24  GLUCOSE 89 73  BUN 10 8  CREATININE 0.69 0.50  CALCIUM 8.8 9.3   PT/INR No results found for this basename: LABPROT, INR,  in the last 72 hours  Studies/Results: Dg Abd 1 View  01/30/2014   CLINICAL DATA:  EMESIS ABDOMINAL PAIN  EXAM: ABDOMEN - 1 VIEW  COMPARISON:  CT abdomen pelvis dated 01/30/2014.  FINDINGS: An enteric tube is been inserted coursing along the expected region of the stomach. Tip projecting in the region of the distal stomach. Air within dilated loops of small bowel in distended loops of large bowel appear degenerative changes within the lumbar spine.  IMPRESSION: Patient is status post enteric tube insertion. Findings again consistent with small bowel obstruction.   Electronically Signed   By: Salome HolmesHector  Cooper M.D.   On: 01/30/2014 16:54   Ct Abdomen Pelvis W Contrast  01/30/2014   CLINICAL DATA:  Pain.  EXAM: CT ABDOMEN AND PELVIS WITH CONTRAST  TECHNIQUE: Multidetector CT imaging of the abdomen and pelvis was performed using the standard protocol following bolus administration of intravenous contrast.   CONTRAST:  100mL OMNIPAQUE IOHEXOL 300 MG/ML  SOLN  COMPARISON:  CT 08/10/2013.  FINDINGS: No focal hepatic abnormality. Tiny calcifications in spleen consistent with granulomas. Pancreas is unremarkable. The common bile duct and intrahepatic ducts are distended. Although this could be from cholecystectomy other etiologies cannot be excluded. Although no obstructing lesion is identified MRCP should be considered for further evaluation given the degree of biliary distention. Common bile duct measures 16 mm in diameter.  Adrenals are normal. No focal renal abnormality. No hydronephrosis or obstructing ureteral stone. Bladder is nondistended. Hysterectomy. No adnexal mass. Small amount of free pelvic fluid.  Shotty inguinal retroperitoneal lymph nodes. Abdominal aorta is atherosclerotic. No aneurysm. Visceral vessels are patent. Bilateral renal artery atherosclerotic vascular changes are present. Portal vein is present.  The appendix is difficult to visualize. No inflammatory change in right lower quadrant. Stool is noted throughout the colon. The colon is nondistended. Multiple dilated loops of small bowel noted. Distal small bowel is decompressed. These findings are consistent with small bowel obstruction. No free air or pneumatosis. No portal venous air. No hernia.  Cardiomegaly. Coronary artery disease. Mild basilar atelectasis and/or scarring. Severe degenerative changes and scoliosis lumbar spine.  IMPRESSION: 1. Mid to distal small bowel obstruction. 2. Prominent distention of the common bile duct and intrahepatic ducts. Although this could be from cholecystectomy MRCP should be considered for further evaluation.   Electronically Signed   By: Maisie Fushomas  Register   On:  01/30/2014 14:44   Dg Chest Port 1 View  01/30/2014   CLINICAL DATA:  COPD, pain  EXAM: PORTABLE CHEST - 1 VIEW  COMPARISON:  09/28/2013  FINDINGS: The cardiac shadow is enlarged. The lungs are clear bilaterally. Chronic changes in the left shoulder  joint are seen. Postsurgical changes in the right shoulder joint are noted.  IMPRESSION: No acute abnormality seen.   Electronically Signed   By: Alcide Clever M.D.   On: 01/30/2014 14:41   Dg Abd 2 Views  01/31/2014   CLINICAL DATA:  Abdominal pain.  EXAM: ABDOMEN - 2 VIEW  COMPARISON:  01/30/2014.  FINDINGS: Portable study at 0819 hrs. Upright film shows no evidence for intraperitoneal free air. Supine film shows interval decrease in the diffuse gaseous small bowel distention. The degree of colonic distention appears relatively stable. There does appear to be contrast material in the colonic lumen suggesting the oral contrast for the CT scan from yesterday has migrated through the small bowel loops. Contrast in the urinary bladder is compatible with a IV contrast excretion from yesterday's CT scan NG tube tip is in the antral pyloric region and may be just transpyloric.  IMPRESSION: Interval decrease in gaseous small bowel dilatation with stable colonic distention.   Electronically Signed   By: Kennith Center M.D.   On: 01/31/2014 08:40    Anti-infectives: Anti-infectives   None      Assessment/Plan: Impression: Small bowel obstruction resolving. Suspect component of ileus due to urinary tract infection. Hypokalemia. No need for acute surgical intervention. Plan: We'll remove gastric tube and advance diet as tolerated. Agree with treatment for hypokalemia.  LOS: 2 days    Drusilla Wampole A 02/01/2014

## 2014-02-01 NOTE — Care Management Note (Addendum)
    Page 1 of 1   02/04/2014     9:15:32 AM CARE MANAGEMENT NOTE 02/04/2014  Patient:  Veronica Key,Veronica Key   Account Number:  [id number]  Date Initiated:  02/01/2014  Documentation initiated by:  Sharrie RothmanBLACKWELL,Daxton Nydam C  Subjective/Objective Assessment:   Pt admitted from home with SBO. Pt lives alone and will return home at discharge. Pt has a CAP aide 7 days a week, 9-5. Pt is alone the rest of the time. Pt has a rollator, electric wheelchair, and BSC for home use.     Action/Plan:   No CM needs noted. Pt potential discharge over the weekend.   Anticipated DC Date:  02/04/2014   Anticipated DC Plan:  HOME/SELF CARE      DC Planning Services  CM consult      Choice offered to / List presented to:             Status of service:  Completed, signed off Medicare Important Message given?  YES (If response is "NO", the following Medicare IM given date fields will be blank) Date Medicare IM given:  02/01/2014 Medicare IM given by:  Arlyss QueenBLACKWELL,Amberleigh Gerken C Date Additional Medicare IM given:  02/04/2014 Additional Medicare IM given by:  Sharrie RothmanAMMY C Guinevere Stephenson  Discharge Disposition:  HOME/SELF CARE  Per UR Regulation:    If discussed at Long Length of Stay Meetings, dates discussed:    Comments:  02/04/14 0915 Arlyss Queenammy Tonantzin Mimnaugh, RN BSN CM Pt discharged home today. No CM needs noted. IM delivered to pt.  02/01/14 1330 Arlyss Queenammy Maurina Fawaz, RN BSN CM

## 2014-02-02 LAB — CBC
HCT: 38 % (ref 36.0–46.0)
HEMOGLOBIN: 12.8 g/dL (ref 12.0–15.0)
MCH: 29 pg (ref 26.0–34.0)
MCHC: 33.7 g/dL (ref 30.0–36.0)
MCV: 86 fL (ref 78.0–100.0)
Platelets: 243 10*3/uL (ref 150–400)
RBC: 4.42 MIL/uL (ref 3.87–5.11)
RDW: 14.2 % (ref 11.5–15.5)
WBC: 7.3 10*3/uL (ref 4.0–10.5)

## 2014-02-02 LAB — COMPREHENSIVE METABOLIC PANEL
ALBUMIN: 3.2 g/dL — AB (ref 3.5–5.2)
ALK PHOS: 85 U/L (ref 39–117)
ALT: 10 U/L (ref 0–35)
ANION GAP: 12 (ref 5–15)
AST: 15 U/L (ref 0–37)
BILIRUBIN TOTAL: 1.1 mg/dL (ref 0.3–1.2)
BUN: 6 mg/dL (ref 6–23)
CO2: 22 meq/L (ref 19–32)
CREATININE: 0.47 mg/dL — AB (ref 0.50–1.10)
Calcium: 9 mg/dL (ref 8.4–10.5)
Chloride: 97 mEq/L (ref 96–112)
GLUCOSE: 110 mg/dL — AB (ref 70–99)
POTASSIUM: 3.5 meq/L — AB (ref 3.7–5.3)
Sodium: 131 mEq/L — ABNORMAL LOW (ref 137–147)
Total Protein: 6.2 g/dL (ref 6.0–8.3)

## 2014-02-02 NOTE — Progress Notes (Signed)
  Subjective: No specific abdominal pain, nausea, vomiting.  Objective: Vital signs in last 24 hours: Temp:  [98 F (36.7 C)-99.2 F (37.3 C)] 99.2 F (37.3 C) (07/11 0549) Pulse Rate:  [71-78] 78 (07/11 0550) Resp:  [20] 20 (07/11 0549) BP: (141-201)/(52-83) 188/83 mmHg (07/11 0550) SpO2:  [96 %-98 %] 98 % (07/11 0549) Last BM Date: 02/01/14  Intake/Output from previous day: 07/10 0701 - 07/11 0700 In: 1525 [P.O.:785; I.V.:740] Out: 1200 [Urine:1200] Intake/Output this shift: Total I/O In: 480 [P.O.:480] Out: -   General appearance: alert, cooperative and no distress GI: soft, non-tender; bowel sounds normal; no masses,  no organomegaly  Lab Results:   Recent Labs  02/01/14 0518 02/02/14 0604  WBC 8.0 7.3  HGB 13.5 12.8  HCT 40.5 38.0  PLT 249 243   BMET  Recent Labs  02/01/14 0518 02/02/14 0604  NA 136* 131*  K 3.0* 3.5*  CL 96 97  CO2 24 22  GLUCOSE 73 110*  BUN 8 6  CREATININE 0.50 0.47*  CALCIUM 9.3 9.0   PT/INR No results found for this basename: LABPROT, INR,  in the last 72 hours  Studies/Results: No results found.  Anti-infectives: Anti-infectives   None      Assessment/Plan: Impression: Small bowel torsion, resolved. Suspect partial ileus due to UTI. Plan: Will advance to soft diet. We'll follow peripherally with you. No need for acute surgical intervention.  LOS: 3 days    Harlie Buening A 02/02/2014

## 2014-02-02 NOTE — Progress Notes (Signed)
NAMOtis Peak:  Rackley, Genita                  ACCOUNT NO.:  [id number]  MEDICAL RECORD NO.:  [id number]  LOCATION:  A305                          FACILITY:  [facility]  PHYSICIAN:  Melvyn Novasichard Michael Amaury Kuzel, MDDATE OF BIRTH:  11-Oct-1940  DATE OF PROCEDURE: DATE OF DISCHARGE:                                PROGRESS NOTE   HISTORY OF PRESENT ILLNESS:  A [age] year old female with partial distal small-bowel obstruction resolving clinically.  She had issues with hypokalemia currently being repleted with IV potassium.  CT scan revealed decompressed distal small bowel and multiple dilated loops of small bowel proximal to that.  There was no free air noted.  There was no evidence of leukocytosis.  Hemoglobin 12.8, WBC 7, and potassium currently 3.5.  PHYSICAL EXAMINATION:  LUNGS:  Clear with diminished breath sounds at the bases.  No rales, wheeze, or rhonchi. HEART:  Regular rhythm.  No murmurs, gallops, or rubs. ABDOMEN:  Soft.  Bowel sounds are normoactive.  No peristaltic rushes. No guarding, rebound.  Currently on clear liquids.  PLAN:  Right now is to maintain clear liquids for at least another 24 hours.  Monitor electrolytes.  Have the patient is set up and try and ambulate with assistance if possible, and we will make further recommendations as the database expands.     Melvyn Novasichard Michael Neisha Hinger, MD     RMD/MEDQ  D:  02/02/2014  T:  02/02/2014  Job:  [id number]

## 2014-02-02 NOTE — Progress Notes (Signed)
[id number] 

## 2014-02-03 LAB — BASIC METABOLIC PANEL
Anion gap: 12 (ref 5–15)
BUN: 7 mg/dL (ref 6–23)
CALCIUM: 9.2 mg/dL (ref 8.4–10.5)
CO2: 24 meq/L (ref 19–32)
CREATININE: 0.48 mg/dL — AB (ref 0.50–1.10)
Chloride: 100 mEq/L (ref 96–112)
GFR calc Af Amer: >90 mL/min (ref >90)
Glucose, Bld: 120 mg/dL — ABNORMAL HIGH (ref 70–99)
Potassium: 3.3 mEq/L — ABNORMAL LOW (ref 3.7–5.3)
SODIUM: 136 meq/L — AB (ref 137–147)

## 2014-02-03 MED ORDER — POTASSIUM CHLORIDE CRYS ER 20 MEQ PO TBCR
20.0000 meq | EXTENDED_RELEASE_TABLET | Freq: Every day | ORAL | Status: DC
  Administered 2014-02-03 – 2014-02-04 (×2): 20 meq via ORAL
  Filled 2014-02-03 (×2): qty 1

## 2014-02-03 NOTE — Progress Notes (Signed)
Rechecked BP. 143/48. Will continue to monitor.

## 2014-02-03 NOTE — Progress Notes (Signed)
  Subjective: Note Donald pain, nausea, or vomiting. Tolerating soft diet well. Has had multiple bowel movements.  Objective: Vital signs in last 24 hours: Temp:  [97.4 F (36.3 C)-98.4 F (36.9 C)] 98.4 F (36.9 C) (07/12 0405) Pulse Rate:  [58-80] 58 (07/12 0405) Resp:  [20] 20 (07/12 0405) BP: (143-205)/(48-106) 143/48 mmHg (07/12 0600) SpO2:  [97 %-99 %] 98 % (07/12 0405) Last BM Date: 02/02/14  Intake/Output from previous day: 07/11 0701 - 07/12 0700 In: 1688.5 [P.O.:1190; I.V.:498.5] Out: 975 [Urine:975] Intake/Output this shift: Total I/O In: 200 [P.O.:200] Out: -   General appearance: alert, cooperative and no distress GI: soft, non-tender; bowel sounds normal; no masses,  no organomegaly  Lab Results:   Recent Labs  02/01/14 0518 02/02/14 0604  WBC 8.0 7.3  HGB 13.5 12.8  HCT 40.5 38.0  PLT 249 243   BMET  Recent Labs  02/02/14 0604 02/03/14 0530  NA 131* 136*  K 3.5* 3.3*  CL 97 100  CO2 22 24  GLUCOSE 110* 120*  BUN 6 7  CREATININE 0.47* 0.48*  CALCIUM 9.0 9.2   PT/INR No results found for this basename: LABPROT, INR,  in the last 72 hours  Studies/Results: No results found.  Anti-infectives: Anti-infectives   None      Assessment/Plan: Impression: Small bowel obstruction, resolved Plan: Okay for discharge from surgery standpoint. We'll sign off. Please call me if I can be of further assistance.  LOS: 4 days    Devynn Scheff A 02/03/2014

## 2014-02-03 NOTE — Progress Notes (Signed)
NAMOtis Peak:  Veronica Key, Veronica Key                  ACCOUNT NO.:  [id number]  MEDICAL RECORD NO.:  [id number]  LOCATION:  A305                          FACILITY:  [facility]  PHYSICIAN:  Melvyn Novasichard Michael Ashad Fawbush, MDDATE OF BIRTH:  [birth year]  DATE OF PROCEDURE: DATE OF DISCHARGE:                                PROGRESS NOTE   SUBJECTIVE:  The patient admitted with partial small bowel obstruction, possible ileus, inactive at home, bed-to-chair __________ she has an aide.  She likewise has concomitant hypokalemia, on intravenous potassium infusion.  OBJECTIVE:  LUNGS:  Clear.  No rales, wheeze, or rhonchi. HEART:  Regular rhythm.  No murmurs, gallops, or rubs. ABDOMEN:  Soft.  No peristaltic rushes.  No guarding or rebound.  No masses.  No megaly.  LABORATORY DATA:  Potassium is 3.3 today.  The patient suggested to advance to soft diet.  PLAN:  Right now is to observe her clinical response to soft diet.  Add oral potassium 20 mEq p.o. daily.  Check BMET in a.m. and consider possible discharge if she is clinically responding well to soft diet.     Melvyn Novasichard Michael Kaniya Trueheart, MD     RMD/MEDQ  D:  02/03/2014  T:  02/03/2014  Job:  [id number]

## 2014-02-03 NOTE — Progress Notes (Signed)
Pts BP this am 205/106. Administering PRN Apresoline 20 mg IV at this time. Will recheck in 30 mins. Pt sleeping at this time. Hasn't had any complaints.

## 2014-02-03 NOTE — Progress Notes (Signed)
[id number] 

## 2014-02-03 NOTE — Progress Notes (Signed)
Patient ambulated by Lucendia HerrlichFaye, NT. Patient walked using her four wheeler walker and had no complications other than being weak. Patient ambulated around 200 feet. Will continue to monitor patient.

## 2014-02-04 LAB — URINE CULTURE: Colony Count: >=100000

## 2014-02-04 LAB — BASIC METABOLIC PANEL WITH GFR
Anion gap: 11 (ref 5–15)
BUN: 9 mg/dL (ref 6–23)
CO2: 25 meq/L (ref 19–32)
Calcium: 9.2 mg/dL (ref 8.4–10.5)
Chloride: 101 meq/L (ref 96–112)
Creatinine, Ser: 0.55 mg/dL (ref 0.50–1.10)
GFR calc Af Amer: >90 mL/min
GFR calc non Af Amer: >90 mL/min
Glucose, Bld: 109 mg/dL — ABNORMAL HIGH (ref 70–99)
Potassium: 3.9 meq/L (ref 3.7–5.3)
Sodium: 137 meq/L (ref 137–147)

## 2014-02-04 NOTE — Progress Notes (Signed)
Discharged home with instructions given on medications,and follow up visits,patient verbalized understanding.Prescription to be picked up at Pharmacy of choice.Family at the bedside. No c/o pain or discomfort noted.Accompanied by staff to an awaiting vehicle.

## 2014-02-04 NOTE — Discharge Summary (Signed)
Physician Discharge Summary  Patient ID: Veronica Key MRN: [id number] DOB/AGE: [birth year] 73 y.o. Primary Care Physician:KNOWLTON,[name], MD Admit date: 01/30/2014 Discharge date: 02/04/2014    Discharge Diagnoses:  1. Small bowel obstruction, resolved. 2. Hypertension. 3. Anxiety and depression. 4. Obesity.     Medication List         albuterol 108 (90 BASE) MCG/ACT inhaler  Commonly known as:  PROVENTIL HFA;VENTOLIN HFA  Inhale 2 puffs into the lungs every 4 (four) hours as needed for wheezing or shortness of breath.     albuterol (2.5 MG/3ML) 0.083% nebulizer solution  Commonly known as:  PROVENTIL  Take 2.5 mg by nebulization 4 (four) times daily as needed for wheezing or shortness of breath.     amLODipine 5 MG tablet  Commonly known as:  NORVASC  Take 5 mg by mouth daily.     benazepril 20 MG tablet  Commonly known as:  LOTENSIN  Take 20 mg by mouth 2 (two) times daily.     celecoxib 200 MG capsule  Commonly known as:  CELEBREX  Take 200 mg by mouth daily.     diazepam 5 MG tablet  Commonly known as:  VALIUM  Take 5 mg by mouth 2 (two) times daily.     DULoxetine 60 MG capsule  Commonly known as:  CYMBALTA  Take 60 mg by mouth daily.     HYDROcodone-acetaminophen 10-325 MG per tablet  Commonly known as:  NORCO  Take 1 tablet by mouth 2 (two) times daily as needed (pain).     labetalol 200 MG tablet  Commonly known as:  NORMODYNE  Take 400 mg by mouth 2 (two) times daily.     oxybutynin 5 MG tablet  Commonly known as:  DITROPAN  Take 10 mg by mouth daily.     pantoprazole 40 MG tablet  Commonly known as:  PROTONIX  Take 40 mg by mouth daily.     tiZANidine 4 MG tablet  Commonly known as:  ZANAFLEX  Take 4 mg by mouth 3 (three) times daily.        Discharged Condition: Stable    Consults: Surgery, Dr. Lovell Sheehan.  Significant Diagnostic Studies: Dg Abd 1 View  01/30/2014   CLINICAL DATA:  EMESIS ABDOMINAL PAIN  EXAM: ABDOMEN - 1 VIEW   COMPARISON:  CT abdomen pelvis dated 01/30/2014.  FINDINGS: An enteric tube is been inserted coursing along the expected region of the stomach. Tip projecting in the region of the distal stomach. Air within dilated loops of small bowel in distended loops of large bowel appear degenerative changes within the lumbar spine.  IMPRESSION: Patient is status post enteric tube insertion. Findings again consistent with small bowel obstruction.   Electronically Signed   By: Salome Holmes M.D.   On: 01/30/2014 16:54   Ct Abdomen Pelvis W Contrast  01/30/2014   CLINICAL DATA:  Pain.  EXAM: CT ABDOMEN AND PELVIS WITH CONTRAST  TECHNIQUE: Multidetector CT imaging of the abdomen and pelvis was performed using the standard protocol following bolus administration of intravenous contrast.  CONTRAST:  OMNIPAQUE IOHEXOL 300 MG/ML  SOLN  COMPARISON:  CT 08/10/2013.  FINDINGS: No focal hepatic abnormality. Tiny calcifications in spleen consistent with granulomas. Pancreas is unremarkable. The common bile duct and intrahepatic ducts are distended. Although this could be from cholecystectomy other etiologies cannot be excluded. Although no obstructing lesion is identified MRCP should be considered for further evaluation given the degree of biliary distention. Common bile duct  measures 16 mm in diameter.  Adrenals are normal. No focal renal abnormality. No hydronephrosis or obstructing ureteral stone. Bladder is nondistended. Hysterectomy. No adnexal mass. Small amount of free pelvic fluid.  Shotty inguinal retroperitoneal lymph nodes. Abdominal aorta is atherosclerotic. No aneurysm. Visceral vessels are patent. Bilateral renal artery atherosclerotic vascular changes are present. Portal vein is present.  The appendix is difficult to visualize. No inflammatory change in right lower quadrant. Stool is noted throughout the colon. The colon is nondistended. Multiple dilated loops of small bowel noted. Distal small bowel is  decompressed. These findings are consistent with small bowel obstruction. No free air or pneumatosis. No portal venous air. No hernia.  Cardiomegaly. Coronary artery disease. Mild basilar atelectasis and/or scarring. Severe degenerative changes and scoliosis lumbar spine.  IMPRESSION: 1. Mid to distal small bowel obstruction. 2. Prominent distention of the common bile duct and intrahepatic ducts. Although this could be from cholecystectomy MRCP should be considered for further evaluation.   Electronically Signed   By: Maisie Fus  Register   On: 01/30/2014 14:44   Dg Chest Port 1 View  01/30/2014   CLINICAL DATA:  COPD, pain  EXAM: PORTABLE CHEST - 1 VIEW  COMPARISON:  09/28/2013  FINDINGS: The cardiac shadow is enlarged. The lungs are clear bilaterally. Chronic changes in the left shoulder joint are seen. Postsurgical changes in the right shoulder joint are noted.  IMPRESSION: No acute abnormality seen.   Electronically Signed   By: Alcide Clever M.D.   On: 01/30/2014 14:41   Dg Abd 2 Views  01/31/2014   CLINICAL DATA:  Abdominal pain.  EXAM: ABDOMEN - 2 VIEW  COMPARISON:  01/30/2014.  FINDINGS: Portable study at 0819 hrs. Upright film shows no evidence for intraperitoneal free air. Supine film shows interval decrease in the diffuse gaseous small bowel distention. The degree of colonic distention appears relatively stable. There does appear to be contrast material in the colonic lumen suggesting the oral contrast for the CT scan from yesterday has migrated through the small bowel loops. Contrast in the urinary bladder is compatible with a IV contrast excretion from yesterday's CT scan NG tube tip is in the antral pyloric region and may be just transpyloric.  IMPRESSION: Interval decrease in gaseous small bowel dilatation with stable colonic distention.   Electronically Signed   By: Kennith Center M.D.   On: 01/31/2014 08:40    Lab Results: Basic Metabolic Panel:  Recent Labs  95/28/41 0530 02/04/14 0534  NA  136* 137  K 3.3* 3.9  CL 100 101  CO2 24 25  GLUCOSE 120* 109*  BUN 7 9  CREATININE 0.48* 0.55  CALCIUM 9.2 9.2   Liver Function Tests:  Recent Labs  02/02/14 0604  AST 15  ALT 10  ALKPHOS 85  BILITOT 1.1  PROT 6.2  ALBUMIN 3.2*     CBC:  Recent Labs  02/02/14 0604  WBC 7.3  HGB 12.8  HCT 38.0  MCV 86.0  PLT 243    Recent Results (from the past 240 hour(s))  URINE CULTURE     Status: None   Collection Time    01/30/14  3:05 PM      Result Value Ref Range Status   Specimen Description URINE, CLEAN CATCH   Final   Special Requests NONE   Final   Culture  Setup Time     Final   Value: 01/31/2014 04:28     Performed at [facility] Count  Final   Value: >=100,000 COLONIES/ML     Performed at [facility]   Culture     Final   Value: PROTEUS MIRABILIS     Performed at [facility]   Report Status PENDING   Incomplete  MRSA PCR SCREENING     Status: Abnormal   Collection Time    01/30/14  6:53 PM      Result Value Ref Range Status   MRSA by PCR POSITIVE (*) NEGATIVE Final   Comment:            The GeneXpert MRSA Assay (FDA     approved for NASAL specimens     only), is one component of a     comprehensive MRSA colonization     surveillance program. It is not     intended to diagnose MRSA     infection nor to guide or     monitor treatment for     MRSA infections.     RESULT CALLED TO, READ BACK BY AND VERIFIED WITH:     BRAGGS,D ON 01/30/14 AT 2150 BY LOY,C  CLOSTRIDIUM DIFFICILE BY PCR     Status: None   Collection Time    01/30/14  8:31 PM      Result Value Ref Range Status   C difficile by pcr NEGATIVE  NEGATIVE Final     Hospital Course: This is a 73 year old lady who presented with symptoms of abdominal pain, nausea and vomiting. Please see initial history as outlined below: Veronica Key is a 73 y.o. female with a Past Medical History of hypertension, anxiety, peptic ulcer disease who presents today with the  above noted complaint. Per patient, she was in her usual state of health, around dinnertime yesterday when she started having upper abdominal pain which she describes as colicky in nature, 10/10 in severity, without any radiation. This was associated with numerous episodes of perfuse vomiting which was nonbloody. Patient continued to have these symptoms over the morning as well, this result she proceeded to come to the emergency room, where a CT scan of the abdomen was positive for small bowel obstruction. I was subsequently asked to admit this patient for further evaluation and treatment  Patient claims to have had a small bowel movement earlier this morning, has not had any flatus since then. She denies any fever, headache, chest pain or shortness of breath.  Patient claims to have had a open cholecystectomy and a hysterectomy in the past. The patient was felt to have small bowel obstruction and she was treated with a n.p.o., IV fluids and NG tube. This helped her improve with conservative measures. She was seen by surgery who felt that surgical intervention was not needed and thankfully things did resolve. She was able to tolerate a diet and did have bowel movements. She feels much improved and is keen to go home now. She'll followup in the office within a month or so. Discharge Exam: Blood pressure 158/51, pulse 64, temperature 98.3 F (36.8 C), temperature source Oral, resp. rate 20, height 4\' 11"  (1.499 m), weight 65.325 kg (144 lb 0.3 oz), SpO2 97.00%. She looks systemically well. Is not toxic or septic. Heart sounds are present without murmurs or added sounds. Lung fields are clear. Abdomen is soft and nontender. She is alert and oriented.  Disposition: Home.      Discharge Instructions   Diet - low sodium heart healthy    Complete by:  As directed  Increase activity slowly    Complete by:  As directed            Follow-up Information   Follow up with [name],[name], MD.  Schedule an appointment as soon as possible for a visit in 1 month.   Specialty:  Family Medicine   Contact information:   [address] [place] [address] [phone]       Signed: Wilson SingerGOSRANI,NIMISH C   02/04/2014, 8:45 AM

## 2014-02-04 NOTE — Progress Notes (Signed)
Pt vomited X 1. Receiving IV Zofran. Changed bed linens and washed pt as well. Will continue to monitor pt frequently throughout night. HOB left at 30 degrees. Bed is in lowest position and call bell within reach.

## 2014-05-16 ENCOUNTER — Encounter: Payer: Self-pay | Admitting: Family Medicine

## 2014-05-16 DIAGNOSIS — J449 Chronic obstructive pulmonary disease, unspecified: Secondary | ICD-10-CM | POA: Insufficient documentation

## 2014-07-06 ENCOUNTER — Other Ambulatory Visit: Payer: Self-pay

## 2014-07-06 ENCOUNTER — Inpatient Hospital Stay (HOSPITAL_COMMUNITY)
Admission: EM | Admit: 2014-07-06 | Discharge: 2014-07-14 | DRG: 871 | Disposition: A | Discharge status: Skilled Nursing Facility | Payer: Medicare Other | Attending: Internal Medicine | Admitting: Internal Medicine

## 2014-07-06 ENCOUNTER — Encounter (HOSPITAL_COMMUNITY): Payer: Self-pay

## 2014-07-06 ENCOUNTER — Emergency Department (HOSPITAL_COMMUNITY): Payer: Medicare Other

## 2014-07-06 ENCOUNTER — Encounter (HOSPITAL_COMMUNITY)
Admission: EM | Disposition: A | Discharge status: Skilled Nursing Facility | Payer: Medicare Other | Source: Home / Self Care | Attending: Internal Medicine

## 2014-07-06 DIAGNOSIS — Z66 Do not resuscitate: Secondary | ICD-10-CM | POA: Diagnosis present

## 2014-07-06 DIAGNOSIS — Z8249 Family history of ischemic heart disease and other diseases of the circulatory system: Secondary | ICD-10-CM | POA: Diagnosis not present

## 2014-07-06 DIAGNOSIS — K567 Ileus, unspecified: Secondary | ICD-10-CM

## 2014-07-06 DIAGNOSIS — T80212A Local infection due to central venous catheter, initial encounter: Secondary | ICD-10-CM | POA: Diagnosis not present

## 2014-07-06 DIAGNOSIS — G8929 Other chronic pain: Secondary | ICD-10-CM | POA: Diagnosis present

## 2014-07-06 DIAGNOSIS — Z6833 Body mass index (BMI) 33.0-33.9, adult: Secondary | ICD-10-CM | POA: Diagnosis not present

## 2014-07-06 DIAGNOSIS — I959 Hypotension, unspecified: Secondary | ICD-10-CM | POA: Insufficient documentation

## 2014-07-06 DIAGNOSIS — Z96653 Presence of artificial knee joint, bilateral: Secondary | ICD-10-CM | POA: Diagnosis present

## 2014-07-06 DIAGNOSIS — F40240 Claustrophobia: Secondary | ICD-10-CM | POA: Diagnosis present

## 2014-07-06 DIAGNOSIS — E876 Hypokalemia: Secondary | ICD-10-CM | POA: Diagnosis present

## 2014-07-06 DIAGNOSIS — W1830XA Fall on same level, unspecified, initial encounter: Secondary | ICD-10-CM | POA: Diagnosis present

## 2014-07-06 DIAGNOSIS — I639 Cerebral infarction, unspecified: Secondary | ICD-10-CM

## 2014-07-06 DIAGNOSIS — D649 Anemia, unspecified: Secondary | ICD-10-CM | POA: Diagnosis present

## 2014-07-06 DIAGNOSIS — R6521 Severe sepsis with septic shock: Secondary | ICD-10-CM | POA: Diagnosis present

## 2014-07-06 DIAGNOSIS — K529 Noninfective gastroenteritis and colitis, unspecified: Secondary | ICD-10-CM | POA: Insufficient documentation

## 2014-07-06 DIAGNOSIS — K559 Vascular disorder of intestine, unspecified: Secondary | ICD-10-CM | POA: Insufficient documentation

## 2014-07-06 DIAGNOSIS — Z833 Family history of diabetes mellitus: Secondary | ICD-10-CM | POA: Diagnosis not present

## 2014-07-06 DIAGNOSIS — I952 Hypotension due to drugs: Secondary | ICD-10-CM | POA: Diagnosis not present

## 2014-07-06 DIAGNOSIS — R29810 Facial weakness: Secondary | ICD-10-CM

## 2014-07-06 DIAGNOSIS — J449 Chronic obstructive pulmonary disease, unspecified: Secondary | ICD-10-CM | POA: Diagnosis present

## 2014-07-06 DIAGNOSIS — K560 Paralytic ileus: Secondary | ICD-10-CM | POA: Diagnosis not present

## 2014-07-06 DIAGNOSIS — K219 Gastro-esophageal reflux disease without esophagitis: Secondary | ICD-10-CM | POA: Diagnosis present

## 2014-07-06 DIAGNOSIS — Z9049 Acquired absence of other specified parts of digestive tract: Secondary | ICD-10-CM | POA: Diagnosis present

## 2014-07-06 DIAGNOSIS — I471 Supraventricular tachycardia, unspecified: Secondary | ICD-10-CM | POA: Insufficient documentation

## 2014-07-06 DIAGNOSIS — F419 Anxiety disorder, unspecified: Secondary | ICD-10-CM | POA: Diagnosis present

## 2014-07-06 DIAGNOSIS — K449 Diaphragmatic hernia without obstruction or gangrene: Secondary | ICD-10-CM | POA: Diagnosis present

## 2014-07-06 DIAGNOSIS — E860 Dehydration: Secondary | ICD-10-CM | POA: Diagnosis present

## 2014-07-06 DIAGNOSIS — E871 Hypo-osmolality and hyponatremia: Secondary | ICD-10-CM | POA: Diagnosis present

## 2014-07-06 DIAGNOSIS — A4150 Gram-negative sepsis, unspecified: Principal | ICD-10-CM | POA: Diagnosis present

## 2014-07-06 DIAGNOSIS — E119 Type 2 diabetes mellitus without complications: Secondary | ICD-10-CM | POA: Diagnosis present

## 2014-07-06 DIAGNOSIS — I369 Nonrheumatic tricuspid valve disorder, unspecified: Secondary | ICD-10-CM | POA: Diagnosis not present

## 2014-07-06 DIAGNOSIS — M1990 Unspecified osteoarthritis, unspecified site: Secondary | ICD-10-CM | POA: Diagnosis present

## 2014-07-06 DIAGNOSIS — Z8711 Personal history of peptic ulcer disease: Secondary | ICD-10-CM

## 2014-07-06 DIAGNOSIS — I10 Essential (primary) hypertension: Secondary | ICD-10-CM | POA: Insufficient documentation

## 2014-07-06 DIAGNOSIS — R471 Dysarthria and anarthria: Secondary | ICD-10-CM | POA: Diagnosis present

## 2014-07-06 DIAGNOSIS — I479 Paroxysmal tachycardia, unspecified: Secondary | ICD-10-CM | POA: Insufficient documentation

## 2014-07-06 DIAGNOSIS — G459 Transient cerebral ischemic attack, unspecified: Secondary | ICD-10-CM | POA: Insufficient documentation

## 2014-07-06 DIAGNOSIS — E669 Obesity, unspecified: Secondary | ICD-10-CM | POA: Diagnosis present

## 2014-07-06 DIAGNOSIS — K279 Peptic ulcer, site unspecified, unspecified as acute or chronic, without hemorrhage or perforation: Secondary | ICD-10-CM | POA: Diagnosis present

## 2014-07-06 DIAGNOSIS — I472 Ventricular tachycardia: Secondary | ICD-10-CM | POA: Diagnosis not present

## 2014-07-06 DIAGNOSIS — R109 Unspecified abdominal pain: Secondary | ICD-10-CM | POA: Insufficient documentation

## 2014-07-06 DIAGNOSIS — I739 Peripheral vascular disease, unspecified: Secondary | ICD-10-CM | POA: Diagnosis present

## 2014-07-06 DIAGNOSIS — M545 Low back pain: Secondary | ICD-10-CM | POA: Diagnosis present

## 2014-07-06 DIAGNOSIS — E872 Acidosis: Secondary | ICD-10-CM | POA: Diagnosis present

## 2014-07-06 DIAGNOSIS — R350 Frequency of micturition: Secondary | ICD-10-CM | POA: Diagnosis present

## 2014-07-06 DIAGNOSIS — E785 Hyperlipidemia, unspecified: Secondary | ICD-10-CM | POA: Diagnosis present

## 2014-07-06 DIAGNOSIS — F329 Major depressive disorder, single episode, unspecified: Secondary | ICD-10-CM | POA: Diagnosis present

## 2014-07-06 DIAGNOSIS — E861 Hypovolemia: Secondary | ICD-10-CM | POA: Diagnosis present

## 2014-07-06 DIAGNOSIS — A419 Sepsis, unspecified organism: Secondary | ICD-10-CM

## 2014-07-06 DIAGNOSIS — F17210 Nicotine dependence, cigarettes, uncomplicated: Secondary | ICD-10-CM | POA: Diagnosis present

## 2014-07-06 DIAGNOSIS — N179 Acute kidney failure, unspecified: Secondary | ICD-10-CM | POA: Diagnosis present

## 2014-07-06 HISTORY — DX: Type 2 diabetes mellitus without complications: E11.9

## 2014-07-06 HISTORY — DX: Unspecified intestinal obstruction, unspecified as to partial versus complete obstruction: K56.609

## 2014-07-06 LAB — DIFFERENTIAL
BASOS ABS: 0 10*3/uL (ref 0.0–0.1)
BASOS PCT: 0 % (ref 0–1)
EOS ABS: 0.3 10*3/uL (ref 0.0–0.7)
Eosinophils Relative: 2 % (ref 0–5)
Lymphocytes Relative: 13 % (ref 12–46)
Lymphs Abs: 1.9 10*3/uL (ref 0.7–4.0)
MONOS PCT: 4 % (ref 3–12)
Monocytes Absolute: 0.6 10*3/uL (ref 0.1–1.0)
NEUTROS PCT: 81 % — AB (ref 43–77)
Neutro Abs: 12 10*3/uL — ABNORMAL HIGH (ref 1.7–7.7)

## 2014-07-06 LAB — CBC
HCT: 43.9 % (ref 36.0–46.0)
Hemoglobin: 15 g/dL (ref 12.0–15.0)
MCH: 29.7 pg (ref 26.0–34.0)
MCHC: 34.2 g/dL (ref 30.0–36.0)
MCV: 86.9 fL (ref 78.0–100.0)
Platelets: 280 10*3/uL (ref 150–400)
RBC: 5.05 MIL/uL (ref 3.87–5.11)
RDW: 13.7 % (ref 11.5–15.5)
WBC: 14.8 10*3/uL — ABNORMAL HIGH (ref 4.0–10.5)

## 2014-07-06 LAB — I-STAT CHEM 8, ED
BUN: 16 mg/dL (ref 6–23)
Calcium, Ion: 1.17 mmol/L (ref 1.13–1.30)
Chloride: 96 mEq/L (ref 96–112)
Creatinine, Ser: 1.1 mg/dL (ref 0.50–1.10)
Glucose, Bld: 152 mg/dL — ABNORMAL HIGH (ref 70–99)
HEMATOCRIT: 50 % — AB (ref 36.0–46.0)
Hemoglobin: 17 g/dL — ABNORMAL HIGH (ref 12.0–15.0)
POTASSIUM: 3.6 meq/L — AB (ref 3.7–5.3)
Sodium: 130 mEq/L — ABNORMAL LOW (ref 137–147)
TCO2: 18 mmol/L (ref 0–100)

## 2014-07-06 LAB — COMPREHENSIVE METABOLIC PANEL
ALBUMIN: 3.9 g/dL (ref 3.5–5.2)
ALK PHOS: 134 U/L — AB (ref 39–117)
ALT: 11 U/L (ref 0–35)
ANION GAP: 20 — AB (ref 5–15)
AST: 18 U/L (ref 0–37)
BUN: 15 mg/dL (ref 6–23)
CO2: 19 mEq/L (ref 19–32)
Calcium: 10.3 mg/dL (ref 8.4–10.5)
Chloride: 90 mEq/L — ABNORMAL LOW (ref 96–112)
Creatinine, Ser: 0.84 mg/dL (ref 0.50–1.10)
GFR calc Af Amer: 79 mL/min — ABNORMAL LOW (ref >90)
GFR calc non Af Amer: 68 mL/min — ABNORMAL LOW (ref >90)
Glucose, Bld: 150 mg/dL — ABNORMAL HIGH (ref 70–99)
POTASSIUM: 3.7 meq/L (ref 3.7–5.3)
SODIUM: 129 meq/L — AB (ref 137–147)
TOTAL PROTEIN: 7.4 g/dL (ref 6.0–8.3)
Total Bilirubin: 0.7 mg/dL (ref 0.3–1.2)

## 2014-07-06 LAB — ETHANOL

## 2014-07-06 LAB — PROTIME-INR
INR: 1.22 (ref 0.00–1.49)
PROTHROMBIN TIME: 15.5 s — AB (ref 11.6–15.2)

## 2014-07-06 LAB — I-STAT TROPONIN, ED: Troponin i, poc: 0.02 ng/mL (ref 0.00–0.08)

## 2014-07-06 LAB — I-STAT CG4 LACTIC ACID, ED: Lactic Acid, Venous: 3.29 mmol/L — ABNORMAL HIGH (ref 0.5–2.2)

## 2014-07-06 LAB — APTT: APTT: 30 s (ref 24–37)

## 2014-07-06 LAB — TROPONIN I: Troponin I: <0.3 ng/mL (ref <0.30)

## 2014-07-06 SURGERY — LEFT HEART CATH
Anesthesia: Moderate Sedation

## 2014-07-06 MED ORDER — METRONIDAZOLE IN NACL 5-0.79 MG/ML-% IV SOLN
500.0000 mg | Freq: Three times a day (TID) | INTRAVENOUS | Status: DC
  Administered 2014-07-07 – 2014-07-12 (×16): 500 mg via INTRAVENOUS
  Filled 2014-07-06 (×18): qty 100

## 2014-07-06 MED ORDER — OXYBUTYNIN CHLORIDE 5 MG PO TABS
10.0000 mg | ORAL_TABLET | Freq: Every day | ORAL | Status: DC
  Administered 2014-07-07 – 2014-07-14 (×8): 10 mg via ORAL
  Filled 2014-07-06 (×8): qty 2

## 2014-07-06 MED ORDER — DULOXETINE HCL 60 MG PO CPEP
60.0000 mg | ORAL_CAPSULE | Freq: Every day | ORAL | Status: DC
  Administered 2014-07-07 – 2014-07-14 (×8): 60 mg via ORAL
  Filled 2014-07-06 (×8): qty 1

## 2014-07-06 MED ORDER — ONDANSETRON HCL 4 MG/2ML IJ SOLN
4.0000 mg | Freq: Once | INTRAMUSCULAR | Status: AC
  Administered 2014-07-06: 4 mg via INTRAVENOUS

## 2014-07-06 MED ORDER — NOREPINEPHRINE BITARTRATE 1 MG/ML IV SOLN
0.0000 ug/min | Freq: Once | INTRAVENOUS | Status: AC
  Administered 2014-07-06: 5 ug/min via INTRAVENOUS

## 2014-07-06 MED ORDER — PIPERACILLIN-TAZOBACTAM 3.375 G IVPB 30 MIN
3.3750 g | Freq: Once | INTRAVENOUS | Status: AC
  Administered 2014-07-06: 3.375 g via INTRAVENOUS
  Filled 2014-07-06: qty 50

## 2014-07-06 MED ORDER — ALBUTEROL SULFATE (2.5 MG/3ML) 0.083% IN NEBU: 2.5000 mg | INHALATION_SOLUTION | Freq: Four times a day (QID) | RESPIRATORY_TRACT | Status: DC | PRN

## 2014-07-06 MED ORDER — SODIUM CHLORIDE 0.9 % IV BOLUS (SEPSIS)
1000.0000 mL | Freq: Once | INTRAVENOUS | Status: AC
  Administered 2014-07-06: 1000 mL via INTRAVENOUS

## 2014-07-06 MED ORDER — FENTANYL CITRATE 0.05 MG/ML IJ SOLN: 25.0000 ug | INTRAMUSCULAR | Status: DC | PRN

## 2014-07-06 MED ORDER — LIDOCAINE HCL (PF) 1 % IJ SOLN
INTRAMUSCULAR | Status: AC
  Filled 2014-07-06: qty 30

## 2014-07-06 MED ORDER — VANCOMYCIN HCL 10 G IV SOLR
1250.0000 mg | Freq: Once | INTRAVENOUS | Status: AC
  Administered 2014-07-06: 1250 mg via INTRAVENOUS
  Filled 2014-07-06: qty 1250

## 2014-07-06 MED ORDER — HEPARIN SODIUM (PORCINE) 5000 UNIT/ML IJ SOLN
5000.0000 [IU] | Freq: Three times a day (TID) | INTRAMUSCULAR | Status: DC
  Administered 2014-07-07 – 2014-07-09 (×9): 5000 [IU] via SUBCUTANEOUS
  Filled 2014-07-06 (×11): qty 1

## 2014-07-06 MED ORDER — SODIUM CHLORIDE 0.9 % IV SOLN
250.0000 mL | INTRAVENOUS | Status: DC | PRN
  Administered 2014-07-07: 250 mL via INTRAVENOUS

## 2014-07-06 MED ORDER — SODIUM CHLORIDE 0.9 % IV BOLUS (SEPSIS)
1000.0000 mL | INTRAVENOUS | Status: AC
  Administered 2014-07-07: 1000 mL via INTRAVENOUS

## 2014-07-06 MED ORDER — NITROGLYCERIN 1 MG/10 ML FOR IR/CATH LAB
INTRA_ARTERIAL | Status: AC
  Filled 2014-07-06: qty 10

## 2014-07-06 MED ORDER — PANTOPRAZOLE SODIUM 40 MG IV SOLR
40.0000 mg | Freq: Every day | INTRAVENOUS | Status: DC
  Administered 2014-07-07: 40 mg via INTRAVENOUS
  Filled 2014-07-06 (×2): qty 40

## 2014-07-06 MED ORDER — HEPARIN (PORCINE) IN NACL 2-0.9 UNIT/ML-% IJ SOLN
INTRAMUSCULAR | Status: AC
Start: 2014-07-06 — End: 2014-07-06
  Filled 2014-07-06: qty 1500

## 2014-07-06 NOTE — Consult Note (Signed)
Neurology Consultation Reason for Consult: Concern for stroke Referring Physician: Leeann MustSteinl, K  CC: Concern for stroke  History is obtained from: Patient  HPI: Veronica Key is a 73 y.o. female who was in her normal state of health around 8:30 PM and you have subsequently became lightheaded and fell. She states that she has been nauseated as well.  She states that she did not pass out completely. EMS was called and en route she was noticed to have a facial droop and dysarthria. Therefore a code stroke was called. In the ED, she was found to be profoundly hypotensice.    LKW: 8:30 pm tpa given?: no, mild deficits.     ROS: A 14 point ROS was performed and is negative except as noted in the HPI.   Past Medical History  Diagnosis Date  . GERD (gastroesophageal reflux disease)     takes Protonix daily  . Urinary frequency     takes Ditropan daily  . Depression     takes Cymbalta daily  . Anxiety     takes Valium daily  . COPD (chronic obstructive pulmonary disease)     uses ALbuterol prn daily  . Hypertension     takes Amlodipine,Benazepril,and Labetalol daily  . Shortness of breath     with exertion  . History of bronchitis     last time in 1976  . Pneumonia     hx of-many yrs ago  . Headache(784.0)     occasionally  . Numbness     both feet  . Arthritis   . Joint pain   . Peripheral edema     but not on any meds  . Chronic back pain     herniated disc and stenosis  . Carpal tunnel syndrome   . H/O hiatal hernia   . History of gastric ulcer   . Urinary urgency   . History of kidney stones   . Asthma   . COPD (chronic obstructive pulmonary disease)     Family History: Dm, htn  Social History: Tob: smoker  Exam: Current vital signs: BP 82/58 mmHg  Pulse 62  Temp(Src) 98.2 F (36.8 C) (Rectal)  Resp 25  SpO2 99% Vital signs in last 24 hours: Temp:  [98.2 F (36.8 C)] 98.2 F (36.8 C) (12/12 2229) Pulse Rate:  [62-69] 62 (12/12 2258) Resp:  [20-34] 25  (12/12 2258) BP: (62-99)/(34-58) 82/58 mmHg (12/12 2301) SpO2:  [89 %-100 %] 99 % (12/12 2258)  Physical Exam  Constitutional: Appears well-developed and well-nourished.  Psych: Affect appropriate to situation, appears anxious Eyes: No scleral injection HENT: No OP obstrucion Head: Normocephalic.  Cardiovascular: Normal rate and regular rhythm.  Respiratory: Effort normal, but slightly tachypnic.  GI: Soft.  No distension. There is no tenderness.  Skin: WDI  Neuro: Mental Status: Patient is awake, alert, oriented to person, unable to give month but does give age.  Patient is able to give a clear and coherent history. No signs of aphasia or neglect She is dysarthric Cranial Nerves: II: Visual Fields are full. Pupils are equal, round, and reactive to light.   III,IV, VI: EOMI without ptosis or diploplia.  V: Facial sensation is symmetric to temperature VII: Facial movement is notabel for left face weakeness.  VIII: hearing is intact to voice X: Uvula elevates symmetrically XI: Shoulder shrug is symmetric. XII: tongue is midline without atrophy or fasciculations.  Motor: Tone is normal. Bulk is normal. 5/5 strength was present in all four extremities.  Sensory: Sensation is symmetric to light touch and temperature in the arms and legs. Cerebellar: FNF intact bilaterally    I have reviewed labs in epic and the results pertinent to this consultation are: Elevated hemoglobin  I have reviewed the images obtained:CT head - no acute findings.   Impression: [age] yo F with presyncope, nausea and hypotension. In this setting she developed facial droop and dysarthria. The deficits do likely represent cerebral ischemia, though due to hypoperfusion or other underlying cause is not yet clear.   Recommendations: 1. HgbA1c, fasting lipid panel 2. MRI, MRA  of the brain without contrast 3. Frequent neuro checks 4. Echocardiogram 5. Carotid dopplers 6. Prophylactic therapy-Antiplatelet  med: Aspirin - dose 325mg  PO or 300mg  PR 7. Risk factor modification 8. Telemetry monitoring 9. PT consult, OT consult, Speech consult once stable.  10. Hypotension management per ED   Ritta SlotMcNeill Amiliana Foutz, MD [facility] [phone]  If 7pm- 7am, please page neurology on call as listed in AMION.

## 2014-07-06 NOTE — ED Provider Notes (Signed)
CSN: [id number]     Arrival date & time 07/06/14  2139 History   First MD Initiated Contact with Patient 07/06/14 2211     Chief Complaint  Patient presents with  . Code Stroke  . Code STEMI    @EDPCLEARED @ (Consider location/radiation/quality/duration/timing/severity/associated sxs/prior Treatment) HPI Patient is a 73 year old female who presents with altered mental status. She was at home last seen normal at 2030 when she answer the door for a health care worker. She went outback to get cigarettes and smoke, when she vomited and fell to the floor. Bystanders say that she was unconscious for approximately 10 minutes. When EMS arrived she was obtunded, with left-sided weakness and facial droop and slurred speech. Therefore she was normotensive. They also took a prehospital ECG that showed borderline elevation in 23, aVF, with no reciprocal changes. She was transported here emergently.  Upon her arrival the patient was awake alert, oriented, with slurred speech. She denied any chest or abdominal pain.  Past Medical History  Diagnosis Date  . GERD (gastroesophageal reflux disease)     takes Protonix daily  . Urinary frequency     takes Ditropan daily  . Depression     takes Cymbalta daily  . Anxiety     takes Valium daily  . COPD (chronic obstructive pulmonary disease)     uses ALbuterol prn daily  . Hypertension     takes Amlodipine,Benazepril,and Labetalol daily  . Shortness of breath     with exertion  . History of bronchitis     last time in 1976  . Pneumonia     hx of-many yrs ago  . Headache(784.0)     occasionally  . Numbness     both feet  . Arthritis   . Joint pain   . Peripheral edema     but not on any meds  . Chronic back pain     herniated disc and stenosis  . Carpal tunnel syndrome   . H/O hiatal hernia   . History of gastric ulcer   . Urinary urgency   . History of kidney stones   . Asthma   . COPD (chronic obstructive pulmonary disease)    Past  Surgical History  Procedure Laterality Date  . Cholecystectomy    . Abdominal hysterectomy    . Bladder tacked    . Shoulder surgery Bilateral   . Joint replacement Bilateral     knee   . Cataract surgery Bilateral   . Epidural block injection    . Esophagogastroduodenoscopy    . Lumbar laminectomy/decompression microdiscectomy N/A 10/02/2013    Procedure: LUMBAR ONE-TWO LAMINECTOMY/DECOMPRESSION MICRODISCECTOMY ;  Surgeon: Barnett AbuHenry Elsner, MD;  Location: [facility];  Service: Neurosurgery;  Laterality: N/A;  L1-2 Laminectomy  . Eye surgery      cataracts  . Spine surgery     Family History  Problem Relation Age of Onset  . Diabetes Mother   . Hypertension Mother   . Arthritis Father   . Heart disease Father   . Arthritis Brother   . Early death Son   . Arthritis Brother   . Arthritis Brother    History  Substance Use Topics  . Smoking status: Current Every Day Smoker -- 0.50 packs/day for 35 years  . Smokeless tobacco: Not on file  . Alcohol Use: No   OB History    No data available     Review of Systems  Constitutional: Positive for chills, diaphoresis and fatigue. Negative for  fever.  Gastrointestinal: Positive for nausea and vomiting.  Neurological: Positive for dizziness, syncope, speech difficulty, weakness, light-headedness and numbness.      Allergies  Adhesive and Cephalexin  Home Medications   Prior to Admission medications   Medication Sig Start Date End Date Taking? Authorizing Provider  albuterol (PROVENTIL HFA;VENTOLIN HFA) 108 (90 BASE) MCG/ACT inhaler Inhale 2 puffs into the lungs every 4 (four) hours as needed for wheezing or shortness of breath.    Historical Provider, MD  albuterol (PROVENTIL) (2.5 MG/3ML) 0.083% nebulizer solution Take 2.5 mg by nebulization 4 (four) times daily as needed for wheezing or shortness of breath.    Historical Provider, MD  amLODipine (NORVASC) 5 MG tablet Take 5 mg by mouth daily.    Historical Provider, MD   benazepril (LOTENSIN) 20 MG tablet Take 20 mg by mouth 2 (two) times daily.    Historical Provider, MD  celecoxib (CELEBREX) 200 MG capsule Take 200 mg by mouth daily.    Historical Provider, MD  Cyanocobalamin (B-12 PO) Take by mouth.    Historical Provider, MD  diazepam (VALIUM) 5 MG tablet Take 5 mg by mouth 2 (two) times daily.    Historical Provider, MD  DULoxetine (CYMBALTA) 60 MG capsule Take 60 mg by mouth daily.    Historical Provider, MD  HYDROcodone-acetaminophen (NORCO) 10-325 MG per tablet Take 1 tablet by mouth 2 (two) times daily as needed (pain).    Historical Provider, MD  labetalol (NORMODYNE) 200 MG tablet Take 400 mg by mouth 2 (two) times daily.     Historical Provider, MD  meclizine (ANTIVERT) 12.5 MG tablet Take 12.5 mg by mouth 2 (two) times daily as needed for dizziness.    Historical Provider, MD  oxybutynin (DITROPAN) 5 MG tablet Take 10 mg by mouth daily.    Historical Provider, MD  pantoprazole (PROTONIX) 40 MG tablet Take 40 mg by mouth daily.    Historical Provider, MD  tiZANidine (ZANAFLEX) 4 MG tablet Take 4 mg by mouth 3 (three) times daily. 12/28/13   Historical Provider, MD  VITAMIN D, ERGOCALCIFEROL, PO Take by mouth.    Historical Provider, MD   BP 74/52 mmHg  Pulse 64  Temp(Src) 98.2 F (36.8 C) (Rectal)  Resp 18  SpO2 100% Physical Exam  Constitutional: She appears distressed.  HENT:  Head: Atraumatic.  Dried vomit around the mouth, red, erythematous skin changes to bilateral cheeks  Eyes: EOM are normal. Pupils are equal, round, and reactive to light.  Neck: Normal range of motion. Neck supple.  Cardiovascular: Normal rate, regular rhythm and normal heart sounds.   No radial pulses palpated  Pulmonary/Chest: Breath sounds normal.  Tachypnea, mildly increased work of breathing  Abdominal: Soft. She exhibits distension. There is no tenderness. There is no rebound and no guarding.  Musculoskeletal: Normal range of motion.  Neurological: She is  alert.  Slurred speech, left-sided facial droop, grip strength diminished on the left, moves all 4 extremities  Skin: There is pallor.  Skin cool  Nursing note and vitals reviewed.   ED Course  Procedures (including critical care time) Labs Review Labs Reviewed  PROTIME-INR - Abnormal; Notable for the following:    Prothrombin Time 15.5 (*)    All other components within normal limits  CBC - Abnormal; Notable for the following:    WBC 14.8 (*)    All other components within normal limits  DIFFERENTIAL - Abnormal; Notable for the following:    Neutrophils Relative % 81 (*)  Neutro Abs 12.0 (*)    All other components within normal limits  COMPREHENSIVE METABOLIC PANEL - Abnormal; Notable for the following:    Sodium 129 (*)    Chloride 90 (*)    Glucose, Bld 150 (*)    Alkaline Phosphatase 134 (*)    GFR calc non Af Amer 68 (*)    GFR calc Af Amer 79 (*)    Anion gap 20 (*)    All other components within normal limits  I-STAT CHEM 8, ED - Abnormal; Notable for the following:    Sodium 130 (*)    Potassium 3.6 (*)    Glucose, Bld 152 (*)    Hemoglobin 17.0 (*)    HCT 50.0 (*)    All other components within normal limits  I-STAT CG4 LACTIC ACID, ED - Abnormal; Notable for the following:    Lactic Acid, Venous 3.29 (*)    All other components within normal limits  CULTURE, BLOOD (ROUTINE X 2)  CULTURE, BLOOD (ROUTINE X 2)  URINE CULTURE  ETHANOL  APTT  TROPONIN I  URINE RAPID DRUG SCREEN (HOSP PERFORMED)  URINALYSIS, ROUTINE W REFLEX MICROSCOPIC  TSH  I-STAT TROPOININ, ED  I-STAT TROPOININ, ED  I-STAT CG4 LACTIC ACID, ED  TYPE AND SCREEN    Imaging Review Ct Head Wo Contrast  07/06/2014   CLINICAL DATA:  Found down, last seen normal at 2030 hr, altered mental status, LEFT-sided weakness and slurred speech.  EXAM: CT HEAD WITHOUT CONTRAST  TECHNIQUE: Contiguous axial images were obtained from the base of the skull through the vertex without intravenous contrast.   COMPARISON:  CT of the head March 01, 2010  FINDINGS: Mild motion degraded examination.  The ventricles and sulci are normal for age. No intraparenchymal hemorrhage, mass effect nor midline shift. Patchy supratentorial and pontine white matter hypodensities are within normal range for patient's age and though non-specific suggest sequelae of chronic small vessel ischemic disease. No acute large vascular territory infarcts.  No abnormal extra-axial fluid collections. Basal cisterns are patent. Moderate calcific atherosclerosis of the carotid siphons and included vertebra arteries.  No skull fracture. The included ocular globes and orbital contents are non-suspicious. Status post bilateral ocular lens implants. RIGHT sphenoid mucosal retention cyst without paranasal sinus air-fluid levels. Patient is edentulous. Soft tissue within the external auditory canals most consistent with cerumen. 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  IMPRESSION: No acute intracranial process on this mildly motion degraded examination.  Moderate to severe white matter changes suggest chronic small vessel ischemic disease. If clinical concern persists for acute ischemia, MRI of the brain with diffusion-weighted sequences would be more sensitive.  Acute findings discussed with and reconfirmed by Dr.Kirkpatrick on 07/06/2014 at 9:55 pm.   Electronically Signed   By: Awilda Metroourtnay  Bloomer   On: 07/06/2014 22:05   Dg Chest Port 1 View  07/06/2014   CLINICAL DATA:  Hypotension, history of COPD, hypertension, shortness of breath and pneumonia.  EXAM: PORTABLE CHEST - 1 VIEW  COMPARISON:  Chest radiograph January 30, 2014  FINDINGS: Cardiac silhouette is upper limits of normal in size. Tortuous, mildly calcified aorta. Strandy densities LEFT lung base. No pleural effusion or focal consolidation. No pneumothorax. Edit  Coil densities in the neck are unchanged, and may be vascular. Status post RIGHT humeral arthroplasty. Bilateral distal  clavicle resection, severe degenerative change LEFT shoulder.  IMPRESSION: Borderline cardiomegaly. LEFT lung base atelectasis versus scarring.   Electronically Signed   By: Awilda Metroourtnay  Bloomer   On:  07/06/2014 22:51     EKG Interpretation None       MDM   Final diagnoses:  Hypotension  Septic shock  Dysarthria   73 year old female presents as an activated code stroke and code STEMI. Upon her arrival, EKG shows minimal elevation in leads 2, 3, aVF, but no reciprocal changes. Patient also appears pale and diaphoretic, to Take and has dysarthria and left facial droop. Discussed with the on-call cardiologist, and code STEMI was the activated. Patient was taken emergently to the CT scanner, and blood pressures obtained by EMS monitor for normotensive. CT of the head was obtained and did not show acute bleed, and the patient was returned to the resuscitation room.  Upon her return to the resuscitation room, the patient became nauseous and vomited again. Her blood pressure was rechecked and found to be in the 60s systolic. Additional IV access is obtained and she was given a 2 L bolus with no improvement in her blood pressure.  RUSH exam performed, with no evidence of free fluid in the peritoneum, normal appearance of the aorta, no pericardial effusion, and normal appearing ejection fraction, no pneumothorax.  Blood pressure was symmetric in both arms, chest x-ray does not show wide mediastinum, patient does not claim any chest pain, doubt this is a dissection.  Patient does have an elevated white blood cell count, lactic acidosis, but no identified source of infection, will start broad-spectrum antibiotics for empiric coverage.  Patient remained persistently hypotensive, so Norepi was started.  We'll consult critical care for admission.  Erskine Emery, MD 07/06/14 2340  Suzi Roots, MD 07/07/14 [phone]

## 2014-07-06 NOTE — Progress Notes (Signed)
Chaplain responded to code stemi. Pt in CT. No family present. Chaplain made ED registration aware of her services should family arrive and need them. Page chaplain as needed.   07/06/14 2100  Clinical Encounter Type  Visited With Health care provider  Visit Type Code  Jiles HaroldStamey, Errika Narvaiz F, Chaplain 07/06/2014 9:51 PM

## 2014-07-06 NOTE — ED Notes (Signed)
Level 2 Sepsis activated @ 2225

## 2014-07-06 NOTE — ED Notes (Signed)
LKN  2030. Patient answered door for in home healthcare worker, and patient went to go get cigarettes to go outside and smoke, then vomited and went unconscious per health care worker. Patient presents with L facial droop, L sided weakness and slurred speech. EMS placed patient on 12L and patient was having ST Elevation in the anterior leads and Depression in the lateral leads. Denies CP. Was given 2 SL Nitro en route. BP 124/70

## 2014-07-06 NOTE — Progress Notes (Signed)
Code Stroke called on 73 y.o. Per EMS visitor at patients home saw patient normal at 2030. At 2045 patient found unresponsive. Per patient she did not pass out completely. Pertinent history includes HTN and COPD. ST elevation assessed while en route to [facility], Code STEMI called and canceled per cardiology upon arrival. BP low and lactic acid elevated, Code SEPSIS called and fluid boluses started. NIHSS completed yielding 6 for lethargy, facial droop, mild confusion, slurred speech. Pt not a candidate for TPA due to mild symptoms. Pt for admit and stroke work up.

## 2014-07-07 ENCOUNTER — Inpatient Hospital Stay (HOSPITAL_COMMUNITY): Payer: Medicare Other

## 2014-07-07 ENCOUNTER — Encounter (HOSPITAL_COMMUNITY): Payer: Self-pay

## 2014-07-07 DIAGNOSIS — I959 Hypotension, unspecified: Secondary | ICD-10-CM | POA: Insufficient documentation

## 2014-07-07 DIAGNOSIS — R471 Dysarthria and anarthria: Secondary | ICD-10-CM | POA: Insufficient documentation

## 2014-07-07 DIAGNOSIS — T80212A Local infection due to central venous catheter, initial encounter: Secondary | ICD-10-CM

## 2014-07-07 DIAGNOSIS — R1010 Upper abdominal pain, unspecified: Secondary | ICD-10-CM

## 2014-07-07 DIAGNOSIS — R109 Unspecified abdominal pain: Secondary | ICD-10-CM | POA: Insufficient documentation

## 2014-07-07 LAB — COMPREHENSIVE METABOLIC PANEL
ALBUMIN: 3 g/dL — AB (ref 3.5–5.2)
ALT: 11 U/L (ref 0–35)
AST: 25 U/L (ref 0–37)
Alkaline Phosphatase: 107 U/L (ref 39–117)
Anion gap: 16 — ABNORMAL HIGH (ref 5–15)
BUN: 19 mg/dL (ref 6–23)
CALCIUM: 8.4 mg/dL (ref 8.4–10.5)
CHLORIDE: 95 meq/L — AB (ref 96–112)
CO2: 20 mEq/L (ref 19–32)
Creatinine, Ser: 0.91 mg/dL (ref 0.50–1.10)
GFR calc Af Amer: 72 mL/min — ABNORMAL LOW (ref >90)
GFR calc non Af Amer: 62 mL/min — ABNORMAL LOW (ref >90)
Glucose, Bld: 174 mg/dL — ABNORMAL HIGH (ref 70–99)
Potassium: 4.4 mEq/L (ref 3.7–5.3)
Sodium: 131 mEq/L — ABNORMAL LOW (ref 137–147)
Total Bilirubin: 0.9 mg/dL (ref 0.3–1.2)
Total Protein: 5.9 g/dL — ABNORMAL LOW (ref 6.0–8.3)

## 2014-07-07 LAB — LACTIC ACID, PLASMA
LACTIC ACID, VENOUS: 1.6 mmol/L (ref 0.5–2.2)
Lactic Acid, Venous: 2.1 mmol/L (ref 0.5–2.2)
Lactic Acid, Venous: 2.1 mmol/L (ref 0.5–2.2)

## 2014-07-07 LAB — RAPID URINE DRUG SCREEN, HOSP PERFORMED
AMPHETAMINES: POSITIVE — AB
BENZODIAZEPINES: POSITIVE — AB
Barbiturates: NOT DETECTED
Cocaine: NOT DETECTED
Opiates: NOT DETECTED
TETRAHYDROCANNABINOL: NOT DETECTED

## 2014-07-07 LAB — TYPE AND SCREEN
ABO/RH(D): B POS
Antibody Screen: NEGATIVE

## 2014-07-07 LAB — CBC
HEMATOCRIT: 41.2 % (ref 36.0–46.0)
Hemoglobin: 13.7 g/dL (ref 12.0–15.0)
MCH: 29.7 pg (ref 26.0–34.0)
MCHC: 33.3 g/dL (ref 30.0–36.0)
MCV: 89.4 fL (ref 78.0–100.0)
Platelets: 242 10*3/uL (ref 150–400)
RBC: 4.61 MIL/uL (ref 3.87–5.11)
RDW: 13.8 % (ref 11.5–15.5)
WBC: 7.1 10*3/uL (ref 4.0–10.5)

## 2014-07-07 LAB — URINALYSIS, ROUTINE W REFLEX MICROSCOPIC
Bilirubin Urine: NEGATIVE
GLUCOSE, UA: NEGATIVE mg/dL
Hgb urine dipstick: NEGATIVE
KETONES UR: NEGATIVE mg/dL
Leukocytes, UA: NEGATIVE
Nitrite: NEGATIVE
Protein, ur: NEGATIVE mg/dL
Specific Gravity, Urine: 1.014 (ref 1.005–1.030)
Urobilinogen, UA: 1 mg/dL (ref 0.0–1.0)
pH: 6 (ref 5.0–8.0)

## 2014-07-07 LAB — CLOSTRIDIUM DIFFICILE BY PCR
CDIFFPCR: NEGATIVE
Toxigenic C. Difficile by PCR: NEGATIVE

## 2014-07-07 LAB — GLUCOSE, CAPILLARY
GLUCOSE-CAPILLARY: 168 mg/dL — AB (ref 70–99)
GLUCOSE-CAPILLARY: 131 mg/dL — AB (ref 70–99)
GLUCOSE-CAPILLARY: 116 mg/dL — AB (ref 70–99)
GLUCOSE-CAPILLARY: 142 mg/dL — AB (ref 70–99)
GLUCOSE-CAPILLARY: 140 mg/dL — AB (ref 70–99)
GLUCOSE-CAPILLARY: 127 mg/dL — AB (ref 70–99)
Glucose-Capillary: 153 mg/dL — ABNORMAL HIGH (ref 70–99)

## 2014-07-07 LAB — TSH: TSH: 1.76 u[IU]/mL (ref 0.350–4.500)

## 2014-07-07 LAB — TROPONIN I: Troponin I: <0.3 ng/mL (ref <0.30)

## 2014-07-07 LAB — MRSA PCR SCREENING: MRSA BY PCR: POSITIVE — AB

## 2014-07-07 LAB — LIPASE, BLOOD: Lipase: 41 U/L (ref 11–59)

## 2014-07-07 MED ORDER — SODIUM CHLORIDE 0.9 % IV BOLUS (SEPSIS)
1000.0000 mL | Freq: Once | INTRAVENOUS | Status: AC
  Administered 2014-07-07: 1000 mL via INTRAVENOUS

## 2014-07-07 MED ORDER — SODIUM CHLORIDE 0.9 % IV SOLN: 250.0000 mL | INTRAVENOUS | Status: DC | PRN

## 2014-07-07 MED ORDER — FAMOTIDINE IN NACL 20-0.9 MG/50ML-% IV SOLN
20.0000 mg | INTRAVENOUS | Status: DC
  Administered 2014-07-07 – 2014-07-11 (×5): 20 mg via INTRAVENOUS
  Filled 2014-07-07 (×6): qty 50

## 2014-07-07 MED ORDER — VANCOMYCIN 50 MG/ML ORAL SOLUTION
500.0000 mg | Freq: Four times a day (QID) | ORAL | Status: DC
  Administered 2014-07-07 (×2): 500 mg
  Filled 2014-07-07 (×5): qty 10

## 2014-07-07 MED ORDER — DEXTROSE 5 % IV SOLN
2.0000 g | Freq: Two times a day (BID) | INTRAVENOUS | Status: DC
  Administered 2014-07-07 – 2014-07-09 (×5): 2 g via INTRAVENOUS
  Filled 2014-07-07 (×6): qty 2

## 2014-07-07 MED ORDER — CHLORHEXIDINE GLUCONATE CLOTH 2 % EX PADS
6.0000 | MEDICATED_PAD | Freq: Every day | CUTANEOUS | Status: AC
  Administered 2014-07-07 – 2014-07-11 (×5): 6 via TOPICAL

## 2014-07-07 MED ORDER — IPRATROPIUM-ALBUTEROL 0.5-2.5 (3) MG/3ML IN SOLN
3.0000 mL | Freq: Four times a day (QID) | RESPIRATORY_TRACT | Status: DC
  Administered 2014-07-07 – 2014-07-08 (×2): 3 mL via RESPIRATORY_TRACT
  Filled 2014-07-07 (×3): qty 3

## 2014-07-07 MED ORDER — MUPIROCIN 2 % EX OINT
1.0000 "application " | TOPICAL_OINTMENT | Freq: Two times a day (BID) | CUTANEOUS | Status: AC
  Administered 2014-07-07 – 2014-07-11 (×10): 1 via NASAL
  Filled 2014-07-07: qty 22

## 2014-07-07 MED ORDER — LORAZEPAM 2 MG/ML IJ SOLN
0.5000 mg | Freq: Four times a day (QID) | INTRAMUSCULAR | Status: DC | PRN
  Administered 2014-07-09: 0.5 mg via INTRAVENOUS
  Filled 2014-07-07: qty 1

## 2014-07-07 MED ORDER — VANCOMYCIN HCL 500 MG IV SOLR
500.0000 mg | Freq: Two times a day (BID) | INTRAVENOUS | Status: DC
  Administered 2014-07-07 – 2014-07-08 (×2): 500 mg via INTRAVENOUS
  Filled 2014-07-07 (×4): qty 500

## 2014-07-07 MED ORDER — ONDANSETRON HCL 4 MG/2ML IJ SOLN: 4.0000 mg | Freq: Three times a day (TID) | INTRAMUSCULAR | Status: DC | PRN

## 2014-07-07 MED ORDER — INSULIN ASPART 100 UNIT/ML ~~LOC~~ SOLN
0.0000 [IU] | SUBCUTANEOUS | Status: DC
  Administered 2014-07-07: 1 [IU] via SUBCUTANEOUS
  Administered 2014-07-07 (×2): 2 [IU] via SUBCUTANEOUS
  Administered 2014-07-07 – 2014-07-08 (×2): 1 [IU] via SUBCUTANEOUS
  Administered 2014-07-08 (×2): 2 [IU] via SUBCUTANEOUS
  Administered 2014-07-08: 3 [IU] via SUBCUTANEOUS
  Administered 2014-07-09: 1 [IU] via SUBCUTANEOUS
  Administered 2014-07-10: 2 [IU] via SUBCUTANEOUS

## 2014-07-07 MED ORDER — CETYLPYRIDINIUM CHLORIDE 0.05 % MT LIQD
7.0000 mL | Freq: Two times a day (BID) | OROMUCOSAL | Status: DC
  Administered 2014-07-07 – 2014-07-12 (×11): 7 mL via OROMUCOSAL

## 2014-07-07 NOTE — Progress Notes (Signed)
Levo weaned to off at 0400, unable to chart bc order was a one time bag

## 2014-07-07 NOTE — Progress Notes (Signed)
07/07/14 Patient transfering from 56M to room 5 W22 with Dx of sepsis and colitis, Multiple iv sites, F/C, NG tube, and rectal tube in place.

## 2014-07-07 NOTE — Progress Notes (Signed)
eLink Physician-Brief Progress Note Patient Name: Veronica Key DOB: 1941-05-17 MRN: [id number]   Date of Service  07/07/2014  HPI/Events of Note  c diff negative x 2  eICU Interventions  Discontinue oral Vanc     Intervention Category Intermediate Interventions: Infection - evaluation and management  Henry RusselSMITH, Mayleigh Tetrault, P 07/07/2014, 8:54 PM

## 2014-07-07 NOTE — H&P (Addendum)
PULMONARY  / CRITICAL CARE MEDICINE HISTORY AND PHYSICAL EXAMINATION  Name: Veronica Key MRN: [id number] DOB: 07/13/1941    ADMISSION DATE:  07/06/2014  CHIEF COMPLAINT: abdominal pain   BRIEF PATIENT DESCRIPTION: 73 y/o woman with history of SBO in July, peptic ulcer disease and GERD, who presents from her assisted living facility after syncopal event subsequent to vomiting and abdominal pain.   SIGNIFICANT EVENTS / STUDIES:  1. 07/06/14 - EMS --> ED after found down with facial droop  2. 07/06/14 - CT head shows chronic small vessel disease but no acute process - code stroke canceled 3. 07/06/14 - EKG with ST segment elevation in inferior leads - code STEMI canceled by cardiology 4. 07/06/14 - Norepi started for shock, CCM called for admission   LINES / TUBES: 1. PIVs 07/06/14 >> 2. Foley 07/06/14 >> 3. Left subclavian CVC 07/06/14 >>  CULTURES: 1. 07/06/14 blood cx x2 >> 2. 07/07/14 urine cx >> 3. 07/07/14 c diff screen >>  ANTIBIOTICS: 1. Zosyn (one dose) 07/06/14 2. Vancomycin 07/06/14 >> 3. Cefepime 07/07/14 >> 4. Flagyl 07/07/14 >>  HISTORY OF PRESENT ILLNESS:   Veronica Key is a 73 year old ALF resident with hx of HTN, recent SBO, anxiety/depression, and COPD (current smoker) who presents via EMS after being found unconscious.  She was reportedly seen vomiting prior to her syncopal episode, and upon regaining consciousness, was found to have a left facial droop and left sided weakness.  In addition, she vomited again in the ED and was found to be profoundly hypotensive.  Code Stroke was initiated, but CT head did not show any bleed, and neurology was consulted.  They felt that she most likely had deficits of dysarthria and left facial droop due to her hypotension, particularly given evidence of chronic small vessel disease on head CT. Cardiology was also called due to ST elevations in inferior leads, but Code STEMI was cancelled due to lack of chest pain and reciprocal changes  on EKG.   CCM was then called for admission for likely sepsis.  The patient is able to provide limited history, which is supplemented by her son, Chrissie NoaWilliam.  She has had constipation and abdominal pain for approximately one week.  She says that today, she had a chicken pot pie at around 5pm, after which she had belly pain and nausea, and then vomited and fell.  In the ED, she has had two very loose bowel movements.  She does not think she has had fevers, and she denies headache, cough, chest pain, shortness of breath, dysuria.  Her urine has been less frequent and more yellow, and she feels dehydrated.   Per Dr. Earlene Plateravis, Lamar Benesush exam revealed no pericardial effusion, ventricular dysfunction, or free peritoneal fluid.   PAST MEDICAL HISTORY :  Past Medical History  Diagnosis Date  . GERD (gastroesophageal reflux disease)     takes Protonix daily  . Urinary frequency     takes Ditropan daily  . Depression     takes Cymbalta daily  . Anxiety     takes Valium daily  . COPD (chronic obstructive pulmonary disease)     uses ALbuterol prn daily  . Hypertension     takes Amlodipine,Benazepril,and Labetalol daily  . Shortness of breath     with exertion  . History of bronchitis     last time in 1976  . Pneumonia     hx of-many yrs ago  . Headache(784.0)     occasionally  . Numbness  both feet  . Arthritis   . Joint pain   . Peripheral edema     but not on any meds  . Chronic back pain     herniated disc and stenosis  . Carpal tunnel syndrome   . H/O hiatal hernia   . History of gastric ulcer   . Urinary urgency   . History of kidney stones   . Asthma   . COPD (chronic obstructive pulmonary disease)     Past Surgical History  Procedure Laterality Date  . Cholecystectomy    . Abdominal hysterectomy    . Bladder tacked    . Shoulder surgery Bilateral   . Joint replacement Bilateral     knee   . Cataract surgery Bilateral   . Epidural block injection    . Esophagogastroduodenoscopy     . Lumbar laminectomy/decompression microdiscectomy N/A 10/02/2013    Procedure: LUMBAR ONE-TWO LAMINECTOMY/DECOMPRESSION MICRODISCECTOMY ;  Surgeon: Barnett Abu, MD;  Location: [facility];  Service: Neurosurgery;  Laterality: N/A;  L1-2 Laminectomy  . Eye surgery      cataracts  . Spine surgery      MEDICATIONS: Prior to Admission medications   Medication Sig Start Date End Date Taking? Authorizing Provider  albuterol (PROVENTIL HFA;VENTOLIN HFA) 108 (90 BASE) MCG/ACT inhaler Inhale 2 puffs into the lungs every 4 (four) hours as needed for wheezing or shortness of breath.    Historical Provider, MD  albuterol (PROVENTIL) (2.5 MG/3ML) 0.083% nebulizer solution Take 2.5 mg by nebulization 4 (four) times daily as needed for wheezing or shortness of breath.    Historical Provider, MD  amLODipine (NORVASC) 5 MG tablet Take 5 mg by mouth daily.    Historical Provider, MD  benazepril (LOTENSIN) 20 MG tablet Take 20 mg by mouth 2 (two) times daily.    Historical Provider, MD  celecoxib (CELEBREX) 200 MG capsule Take 200 mg by mouth daily.    Historical Provider, MD  Cyanocobalamin (B-12 PO) Take by mouth.    Historical Provider, MD  diazepam (VALIUM) 5 MG tablet Take 5 mg by mouth 2 (two) times daily.    Historical Provider, MD  DULoxetine (CYMBALTA) 60 MG capsule Take 60 mg by mouth daily.    Historical Provider, MD  HYDROcodone-acetaminophen (NORCO) 10-325 MG per tablet Take 1 tablet by mouth 2 (two) times daily as needed (pain).    Historical Provider, MD  labetalol (NORMODYNE) 200 MG tablet Take 400 mg by mouth 2 (two) times daily.     Historical Provider, MD  meclizine (ANTIVERT) 12.5 MG tablet Take 12.5 mg by mouth 2 (two) times daily as needed for dizziness.    Historical Provider, MD  oxybutynin (DITROPAN) 5 MG tablet Take 10 mg by mouth daily.    Historical Provider, MD  pantoprazole (PROTONIX) 40 MG tablet Take 40 mg by mouth daily.    Historical Provider, MD  tiZANidine (ZANAFLEX) 4 MG  tablet Take 4 mg by mouth 3 (three) times daily. 12/28/13   Historical Provider, MD  VITAMIN D, ERGOCALCIFEROL, PO Take by mouth.    Historical Provider, MD    ALLERGIES: Allergies  Allergen Reactions  . Adhesive [Tape]     Itching and redness around site;Pt requests paper tape  . Cephalexin Nausea And Vomiting    FAMILY HISTORY:  Family History  Problem Relation Age of Onset  . Diabetes Mother   . Hypertension Mother   . Arthritis Father   . Heart disease Father   . Arthritis Brother   .  Early death Son   . Arthritis Brother   . Arthritis Brother     SOCIAL HISTORY:  reports that she has been smoking.  She does not have any smokeless tobacco history on file. She reports that she does not drink alcohol or use illicit drugs.  REVIEW OF SYSTEMS:  12 point review of systems was performed, with pertinent positives and negatives as above in HPI.   PHYSICAL EXAM:  Temp:  [98.2 F (36.8 C)] 98.2 F (36.8 C) (12/12 2229) Pulse Rate:  [62-69] 64 (12/12 2330) Resp:  [18-34] 18 (12/12 2330) BP: (62-99)/(34-58) 74/52 mmHg (12/12 2330) SpO2:  [89 %-100 %] 100 % (12/12 2330)    VENTILATOR SETTINGS:   General:  Somnolent, ill appearing woman in mild distress, groaning Neuro:  Moves all four extremities spontaneously and on command, no deficits in strength or sensation.  Mild left facial droop, but tongue midline.  She is oriented to person, place, and year  HEENT:  Right eye with thick white discharge, PERRL, extraoccular movements intact, oropharynx and mucus membranes very dry, with cracked tongue  Neck:  neck supple and nontender, trachea midline Cardiovascular:  RRR, no MRG, no JVD Lungs:  Clear bilaterally, mild tachypnea, speaks in complete sentences, no stridor Abdomen:  Tender to palpation in all four quadrants.  I cannot hear any bowel sounds. She exhibits involuntary guarding Musculoskeletal:  No synovitis or joint deformitries Skin:   No rashes or skin breakdown noted.    Intake/Output      12/12 0701 - 12/13 0700   I.V. 1050   Total Intake 1050   Net +1050         CLINICAL DECISION-MAKING:  CBC Recent Labs     07/06/14  2143  07/06/14  2153  WBC  14.8*   --   HGB  15.0  17.0*  HCT  43.9  50.0*  PLT  280   --     Coag's Recent Labs     07/06/14  2143  APTT  30  INR  1.22    BMET Recent Labs     07/06/14  2143  07/06/14  2153  NA  129*  130*  K  3.7  3.6*  CL  90*  96  CO2  19   --   BUN  15  16  CREATININE  0.84  1.10  GLUCOSE  150*  152*    Electrolytes Recent Labs     07/06/14  2143  CALCIUM  10.3   Venous lactate: 3.29  Liver Enzymes Recent Labs     07/06/14  2143  AST  18  ALT  11  ALKPHOS  134*  BILITOT  0.7  ALBUMIN  3.9  lipase 18  Cardiac Enzymes Recent Labs     07/06/14  2143  TROPONINI  <0.30    Imaging Ct Head Wo Contrast  07/06/2014   CLINICAL DATA:  Found down, last seen normal at 2030 hr, altered mental status, LEFT-sided weakness and slurred speech.  EXAM: CT HEAD WITHOUT CONTRAST  TECHNIQUE: Contiguous axial images were obtained from the base of the skull through the vertex without intravenous contrast.  COMPARISON:  CT of the head March 01, 2010  FINDINGS: Mild motion degraded examination.  The ventricles and sulci are normal for age. No intraparenchymal hemorrhage, mass effect nor midline shift. Patchy supratentorial and pontine white matter hypodensities are within normal range for patient's age and though non-specific suggest sequelae of chronic small vessel ischemic disease.  No acute large vascular territory infarcts.  No abnormal extra-axial fluid collections. Basal cisterns are patent. Moderate calcific atherosclerosis of the carotid siphons and included vertebra arteries.  No skull fracture. The included ocular globes and orbital contents are non-suspicious. Status post bilateral ocular lens implants. RIGHT sphenoid mucosal retention cyst without paranasal sinus air-fluid levels.  Patient is edentulous. Soft tissue within the external auditory canals most consistent with cerumen. 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  IMPRESSION: No acute intracranial process on this mildly motion degraded examination.  Moderate to severe white matter changes suggest chronic small vessel ischemic disease. If clinical concern persists for acute ischemia, MRI of the brain with diffusion-weighted sequences would be more sensitive.  Acute findings discussed with and reconfirmed by Dr.Kirkpatrick on 07/06/2014 at 9:55 pm.   Electronically Signed   By: Awilda Metro   On: 07/06/2014 22:05   Dg Chest Port 1 View  07/06/2014   CLINICAL DATA:  Hypotension, history of COPD, hypertension, shortness of breath and pneumonia.  EXAM: PORTABLE CHEST - 1 VIEW  COMPARISON:  Chest radiograph January 30, 2014  FINDINGS: Cardiac silhouette is upper limits of normal in size. Tortuous, mildly calcified aorta. Strandy densities LEFT lung base. No pleural effusion or focal consolidation. No pneumothorax. Edit  Coil densities in the neck are unchanged, and may be vascular. Status post RIGHT humeral arthroplasty. Bilateral distal clavicle resection, severe degenerative change LEFT shoulder.  IMPRESSION: Borderline cardiomegaly. LEFT lung base atelectasis versus scarring.   Electronically Signed   By: Awilda Metro   On: 07/06/2014 22:51    EKG: sinus rhythm, right axis deviation, II, III, avF ST segment elevation.  Prior EKGs with Q waves in inferior leads  CXR: left basilar atelectasis, no effusions or consolidation, ? cardiomegaly  ASSESSMENT / PLAN:  NEUROLOGIC A:  - Possible CVA  - Anxiety history, chronic benzo use  - Depression  P:  - neurology consult following, CT without bleed; recommendation for MRI of brain (non-emergent) - hold home zanaflex, valium - resume cymbalta when able to take PO - given chronic benzo use, add PRN ativan 0.5mg   - hold home oxy, start PRN fentanyl    PULMONARY A:  - COPD P: - Duonebs q6hrs and PRN  CARDIOVASCULAR A:  - Inferior ST segment elevation on EKG, without reciprocal changes, chest pain, and troponin elevation.  Prior EKGs show q waves in inferior leads. Code STEMI canceled by cardiology. I doubt ACS at this time.  - History of hypertension - shock, likely distributive, rule out cardiogenic component  P:  - s/p aspirin and nitro, will hold heparin drip pending abdominal CT  - cycle troponin, EKG at 0400 - pending above, consider cardiology consult.   - hold home antihypertensives in setting of shock  - echocardiogram - check central venous oxygen saturation and CVP  RENAL A:  - Acute kidney injury P: - IV fluids - monitor lytes - hold ACEi - foley placed, U/A and culture pending - foley to monitor UOP   GASTROINTESTINAL A:  - Acute abdominal pain, possible surgical abdomen. Differential includes bowel obstruction, perforation, colitis, diverticulitis, appendicitis, pyelonephritis. Pancreatitis unikely with normal lipase. No CVA tenderness.  LFTs OK.  - Diarrhea, vomiting - History of PUD - GERD P:  - STAT CT of abdomen, then likely surgical consult - Send Cdiff PCR - IV PPI - NPO, place NG tube  HEMATOLOGIC A:  - leukocytosis likely secondary to infection P: - see ID section - trend CBC  INFECTIOUS DISEASE  A:  - septic shock, likely intra-abdominal source P: - Start cefepime, vancomycin, and flagyl empirically - Blood cultures pending - Urine culture ordered - CT abdomen pending, chest XR clear and no respiratory symptoms - early goal directed therapy   ENDOCRINE A:  - hyperglycemia, no hx of diabetes diagnosis P: - Sliding scale insulin   BEST PRACTICE / DISPOSITION Level of Care:  ICU Consultants:  neurology Code Status:  DNR/DNI, confirmed with patient and son Chrissie NoaWilliam (POA) Diet:  NPO, strict DVT Px:  Heparin, SCDs GI Px:  IV PPI Skin Integrity:  Intact; monitor closely   Social / Family:  Son KendallWilliam's phone numbers are in epic and on chart (consent).  Discussed her situation with him at the time of admission.  He is aware she is critically ill.   I have personally obtained a history, examined the patient, evaluated laboratory and imaging results, formulated the assessment and plan and placed orders.  CRITICAL CARE: The patient is critically ill with multiple organ systems failure and requires high complexity decision making for assessment and support, frequent evaluation and titration of therapies, application of advanced monitoring technologies and extensive interpretation of multiple databases. Critical Care Time devoted to patient care services described in this note is 60 minutes.   Please note, the majority of clinical service for this patient took place on 07/06/14.   Pershing CoxJudy Xochitl Egle, MD Pulmonary and Critical Care Medicine [facility] Pager: [phone]  07/07/2014, 12:04 AM

## 2014-07-07 NOTE — Progress Notes (Signed)
STROKE TEAM PROGRESS NOTE   HISTORY  Veronica Key is a 73 y.o. female who was in her normal state of health around 8:30 PM and you have subsequently became lightheaded and fell. She states that she has been nauseated as well. She states that she did not pass out completely. EMS was called and en route she was noticed to have a facial droop and dysarthria. Therefore a code stroke was called. In the ED, she was found to be profoundly hypotensive.    LKW: 8:30 pm tpa given?: no, mild deficits.      SUBJECTIVE (INTERVAL HISTORY) Her family is not is at the bedside.  Overall she feels her condition is gradually improving.     OBJECTIVE Temp:  [97.8 F (36.6 C)-98.2 F (36.8 C)] 97.8 F (36.6 C) (12/13 0246) Pulse Rate:  [62-81] 72 (12/13 0700) Cardiac Rhythm:  [-] Normal sinus rhythm (12/13 0200) Resp:  [15-34] 30 (12/13 0700) BP: (62-169)/(30-124) 143/63 mmHg (12/13 0700) SpO2:  [89 %-100 %] 96 % (12/13 0700) Weight:  [161 lb 9.6 oz (73.3 kg)] 161 lb 9.6 oz (73.3 kg) (12/13 0300)   Recent Labs Lab 07/07/14 0222 07/07/14 0419 07/07/14 0722  GLUCAP 168* 131* 116*    Recent Labs Lab 07/06/14 2143 07/06/14 2153 07/07/14 0100  NA 129* 130* 131*  K 3.7 3.6* 4.4  CL 90* 96 95*  CO2 19  --  20  GLUCOSE 150* 152* 174*  BUN 15 16 19   CREATININE 0.84 1.10 0.91  CALCIUM 10.3  --  8.4    Recent Labs Lab 07/06/14 2143 07/07/14 0100  AST 18 25  ALT 11 11  ALKPHOS 134* 107  BILITOT 0.7 0.9  PROT 7.4 5.9*  ALBUMIN 3.9 3.0*    Recent Labs Lab 07/06/14 2143 07/06/14 2153 07/07/14 0415  WBC 14.8*  --  7.1  NEUTROABS 12.0*  --   --   HGB 15.0 17.0* 13.7  HCT 43.9 50.0* 41.2  MCV 86.9  --  89.4  PLT 280  --  242    Recent Labs Lab 07/06/14 2143 07/07/14 0100 07/07/14 0612  TROPONINI <0.30 <0.30 <0.30    Recent Labs  07/06/14 2143  LABPROT 15.5*  INR 1.22    Recent Labs  07/07/14 0053  COLORURINE YELLOW  LABSPEC 1.014  PHURINE 6.0  GLUCOSEU  NEGATIVE  HGBUR NEGATIVE  BILIRUBINUR NEGATIVE  KETONESUR NEGATIVE  PROTEINUR NEGATIVE  UROBILINOGEN 1.0  NITRITE NEGATIVE  LEUKOCYTESUR NEGATIVE    No results found for: CHOL, TRIG, HDL, CHOLHDL, VLDL, LDLCALC Lab Results  Component Value Date   HGBA1C  10/14/2009    5.7 (NOTE) The ADA recommends the following therapeutic goal for glycemic control related to Hgb A1c measurement: Goal of therapy: <6.5 Hgb A1c  Reference: American Diabetes Association: Clinical Practice Recommendations 2010, Diabetes Care, 2010, 33: (Suppl  1).      Component Value Date/Time   LABOPIA NONE DETECTED 07/07/2014 0053   COCAINSCRNUR NONE DETECTED 07/07/2014 0053   LABBENZ POSITIVE* 07/07/2014 0053   AMPHETMU POSITIVE* 07/07/2014 0053   THCU NONE DETECTED 07/07/2014 0053   LABBARB NONE DETECTED 07/07/2014 0053     Recent Labs Lab 07/06/14 2143  ETH <11    Ct Abdomen Pelvis Wo Contrast 07/07/2014    Wall thickening in the transverse, descending, and rectosigmoid colon consistent with nonspecific colitis, likely infectious or inflammatory. Small bowel fold thickening suggest enteritis. No evidence of small bowel obstruction. Enteric tube tip is in the distal esophagus  and should be advanced if placement in the stomach is desired. Foley and rectal catheters are present.       Ct Head Wo Contrast 07/06/2014    No acute intracranial process on this mildly motion degraded examination.  Moderate to severe white matter changes suggest chronic small vessel ischemic disease. If clinical concern persists for acute ischemia, MRI of the brain with diffusion-weighted sequences would be more sensitive.    Dg Chest Portable 1 View 07/07/2014    Interval placement of LEFT subclavian central venous catheter, distal tip projecting in mid superior vena cava. No pneumothorax.  Stable cardiomegaly and LEFT lung base atelectasis.      Dg Chest Port 1 View 07/06/2014    Borderline cardiomegaly. LEFT lung base  atelectasis versus scarring.      Dg Abd Portable 1v 07/07/2014    Enteric tube tip projects over the distal esophagus. Advancement to the stomach is recommended for better placement.       PHYSICAL EXAM Frail elderly lady not in distress.Awake alert. Afebrile. Head is nontraumatic. Neck is supple without bruit.   l. Cardiac exam no murmur or gallop. Lungs are clear to auscultation. Distal pulses are well felt. Neurological Exam ;  Awake  Alert oriented x 2. Diminished attention, registration and recall. Follows two-step commands. Normal speech and language.eye movements full without nystagmus.fundi were not visualized. Vision acuity and fields appear normal. Hearing is normal. Palatal movements are normal. Face symmetric. Tongue midline. Normal strength, tone, reflexes and coordination. Normal sensation. Gait deferred. ASSESSMENT/PLAN [name] is a 73 y.o. female with history of anxiety, depression, hypertension, COPD, and ongoing tobacco use presenting with facial droop and dysarthria following lightheadedness and subsequent fall. . She did not receive IV t-PA due to mild deficits.  Left BrainTIA:  Non-dominant likely due small vessel disease  In setting of hypotension  Resultant  No deficits  MRI - not ordered  MRA - not ordered  Carotid Doppler - not ordered  2D Echo - pending  LDL - not ordered  HgbA1C - not ordered  Subcutaneous heparin for VTE prophylaxis  Diet NPO time specified no liquids  No antithrombotics prior to admission, now on no antithrombotics  Ongoing aggressive stroke risk factor management  Therapy recommendations:  Pending  Disposition: Pending  Hypertension  Home meds: Lotensin 20 mg twice a day  Stable   Hyperlipidemia  Home meds: No statins prior to admission  LDL pending, goal < 70  Add statin if indicated  Continue statin at discharge  Diabetes  HgbA1c pending goal < 7.0  Controlled  Other Stroke Risk  Factors  Advanced age  Cigarette smoker, advised to stop smoking  Obesity, Body mass index is 32.62 kg/(m^2).    Other Active Problems  UDS - benzodiazepines and amphetamines  Hypotensive on admission  Hyponatremia - sodium 131  Abdominal pain with nausea and vomiting  Other Pertinent History  Ongoing tobacco use  Hospital day # 1 I have personally examined this patient, reviewed notes, independently viewed imaging studies, participated in medical decision making and plan of care. I have made any additions or clarifications directly to the above note. Agree with note above. Patient had transient slurred speech and right-sided weakness in the setting of hypotension and the etiology of which is pending investigated. We'll obtain MRI when the patient is hemodynamically significant  Delia HeadyPramod Srinika Delone, MD Medical Director [facility] Pager: [phone] 07/07/2014 3:38 PM     To contact Stroke Continuity provider,  please refer to [email or web address] After hours, contact General Neurology

## 2014-07-07 NOTE — Evaluation (Signed)
SLP Cancellation Note  Patient Details Name: Veronica Key MRN: [id number] DOB: [birth year]   Cancelled treatment:       Reason Eval/Treat Not Completed:  (pt with NG tube in place for GI issues, will follow for po readiness.)  [name], MS [facility] SLP [phone]

## 2014-07-07 NOTE — H&P (Signed)
PULMONARY  / CRITICAL CARE MEDICINE HISTORY AND PHYSICAL EXAMINATION  Name: Veronica Key MRN: [id number] DOB: 1941-02-28    ADMISSION DATE:  07/06/2014  CHIEF COMPLAINT: abdominal pain   BRIEF PATIENT DESCRIPTION: 73 y/o woman with history of SBO in July, peptic ulcer disease and GERD, who presents from her assisted living facility after syncopal event subsequent to vomiting and abdominal pain.   SIGNIFICANT EVENTS / STUDIES:  1. 07/06/14 - EMS --> ED after found down with facial droop  2. 07/06/14 - CT head shows chronic small vessel disease but no acute process - code stroke canceled 3. 07/06/14 - EKG with ST segment elevation in inferior leads - code STEMI canceled by cardiology 4. 07/06/14 - Norepi started for shock, CCM called for admission   LINES / TUBES: 1. PIVs 07/06/14 >> 2. Foley 07/06/14 >> 3. Left subclavian CVC 07/06/14 >>  CULTURES: 1. 07/06/14 blood cx x2 >> 2. 07/07/14 urine cx >> 3. 07/07/14 c diff screen >>  ANTIBIOTICS: 1. Zosyn (one dose) 07/06/14 2. Vancomycin 07/06/14 >> 3. Cefepime 07/07/14 >> 4. Flagyl 07/07/14 >> 5. Oral vanc 12/12>>>  Subjective:   PHYSICAL EXAM:  Temp:  [97.8 F (36.6 C)-99.3 F (37.4 C)] 99.3 F (37.4 C) (12/13 1000) Pulse Rate:  [62-81] 79 (12/13 1000) Resp:  [15-34] 18 (12/13 1000) BP: (62-169)/(30-124) 106/61 mmHg (12/13 1000) SpO2:  [89 %-100 %] 96 % (12/13 1000) Weight:  [73.3 kg (161 lb 9.6 oz)] 73.3 kg (161 lb 9.6 oz) (12/13 0300) CVP:  [4 mmHg-5 mmHg] 4 mmHg  VENTILATOR SETTINGS:   General: no distress Neuro: follows commands, alert, O x 2, speech improved, facial droop slight>?, moves al ext equal HEENT: jvd wnl PULM: CTA CV: s1 s2 RRR no m  GI: tender diffuse, BS active, no r/g Extremities: deforrmed rt ankle   Intake/Output      12/12 0701 - 12/13 0700 12/13 0701 - 12/14 0700   I.V. (mL/kg) 2409.8 (32.9) 160 (2.2)   IV Piggyback 100 250   Total Intake(mL/kg) 2509.8 (34.2) 410 (5.6)   Urine  (mL/kg/hr) 200 250 (0.8)   Emesis/NG output 150    Stool 350 350 (1.1)   Total Output 700 600   Net +1809.8 -190          CLINICAL DECISION-MAKING:  CBC Recent Labs     07/06/14  2143  07/06/14  2153  07/07/14  0415  WBC  14.8*   --   7.1  HGB  15.0  17.0*  13.7  HCT  43.9  50.0*  41.2  PLT  280   --   242    Coag's Recent Labs     07/06/14  2143  APTT  30  INR  1.22    BMET Recent Labs     07/06/14  2143  07/06/14  2153  07/07/14  0100  NA  129*  130*  131*  K  3.7  3.6*  4.4  CL  90*  96  95*  CO2  19   --   20  BUN  15  16  19   CREATININE  0.84  1.10  0.91  GLUCOSE  150*  152*  174*    Electrolytes Recent Labs     07/06/14  2143  07/07/14  0100  CALCIUM  10.3  8.4   Venous lactate: 3.29  Liver Enzymes Recent Labs     07/06/14  2143  07/07/14  0100  AST  18  25  ALT  11  11  ALKPHOS  134*  107  BILITOT  0.7  0.9  ALBUMIN  3.9  3.0*  lipase 18  Cardiac Enzymes Recent Labs     07/06/14  2143  07/07/14  0100  07/07/14  0612  TROPONINI  <0.30  <0.30  <0.30    Imaging Ct Abdomen Pelvis Wo Contrast  07/07/2014   CLINICAL DATA:  Nausea and vomiting. Lower abdominal pain. History of small bowel obstructions.  EXAM: CT ABDOMEN AND PELVIS WITHOUT CONTRAST  TECHNIQUE: Multidetector CT imaging of the abdomen and pelvis was performed following the standard protocol without IV contrast.  COMPARISON:  01/30/2014  FINDINGS: Atelectasis in the lung bases. Coronary artery calcifications. There appears to be an enteric tube with tip in the distal esophagus. Suggest advancement of the stomach for better positioning.  The unenhanced appearance of the liver, spleen, pancreas, kidneys, inferior vena cava, and retroperitoneal lymph nodes is unremarkable. Calcification of the abdominal aorta without aneurysm. Right adrenal gland nodule measures 2.8 cm diameter. Density measurements consistent with fat. 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 No change since prior study.  Surgical absence of the gallbladder. Mild bile duct dilatation is likely physiologic. Stomach appears normal. Old thickening in the small bowel suggesting enteritis. No evidence of small bowel dilatation. Colon is diffusely stool-filled with evidence of wall thickening in the transverse and descending portions. Mild pericolonic infiltration. This suggests colitis, likely of infectious or inflammatory etiology. No free air or free fluid in the abdomen.  Pelvis: Foley catheter in the bladder. Thickening of the wall of the rectosigmoid colon with pericolonic infiltration consistent with proctitis and colitis. There appears to be a balloon catheter in the rectum. Small amount of free fluid in the pelvis is probably reactive. Appendix is not identified. No pelvic mass or lymphadenopathy. Degenerative changes in the lumbar spine. No destructive bone lesions appreciated.  IMPRESSION: Wall thickening in the transverse, descending, and rectosigmoid colon consistent with nonspecific colitis, likely infectious or inflammatory. Small bowel fold thickening suggest enteritis. No evidence of small bowel obstruction. Enteric tube tip is in the distal esophagus and should be advanced if placement in the stomach is desired. Foley and rectal catheters are present.   Electronically Signed   By: Burman Nieves M.D.   On: 07/07/2014 03:29   Ct Head Wo Contrast  07/06/2014   CLINICAL DATA:  Found down, last seen normal at 2030 hr, altered mental status, LEFT-sided weakness and slurred speech.  EXAM: CT HEAD WITHOUT CONTRAST  TECHNIQUE: Contiguous axial images were obtained from the base of the skull through the vertex without intravenous contrast.  COMPARISON:  CT of the head March 01, 2010  FINDINGS: Mild motion degraded examination.  The ventricles and sulci are normal for age. No intraparenchymal hemorrhage, mass effect nor midline shift. Patchy supratentorial and pontine white matter hypodensities are within normal range for  patient's age and though non-specific suggest sequelae of chronic small vessel ischemic disease. No acute large vascular territory infarcts.  No abnormal extra-axial fluid collections. Basal cisterns are patent. Moderate calcific atherosclerosis of the carotid siphons and included vertebra arteries.  No skull fracture. The included ocular globes and orbital contents are non-suspicious. Status post bilateral ocular lens implants. RIGHT sphenoid mucosal retention cyst without paranasal sinus air-fluid levels. Patient is edentulous. Soft tissue within the external auditory canals most consistent with cerumen. 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  IMPRESSION: No acute intracranial process on this mildly motion degraded examination.  Moderate to severe white matter  changes suggest chronic small vessel ischemic disease. If clinical concern persists for acute ischemia, MRI of the brain with diffusion-weighted sequences would be more sensitive.  Acute findings discussed with and reconfirmed by Dr.Kirkpatrick on 07/06/2014 at 9:55 pm.   Electronically Signed   By: Awilda Metro   On: 07/06/2014 22:05   Dg Chest Portable 1 View  07/07/2014   CLINICAL DATA:  Central line placement.  EXAM: PORTABLE CHEST - 1 VIEW  COMPARISON:  Chest radiograph July 06, 2014  FINDINGS: Interval placement of LEFT subclavian central venous catheter with distal tip projecting in mid superior vena cava. No pneumothorax.  The cardiac silhouette is upper limits of normal in size. Mediastinal silhouette is nonsuspicious, mildly calcified aortic knob. Strandy density persists in LEFT lung base. No pleural effusion. RIGHT humeral arthroplasty. Severe degenerative change of the LEFT shoulder, LEFT distal clavicle osteotomy.  IMPRESSION: Interval placement of LEFT subclavian central venous catheter, distal tip projecting in mid superior vena cava. No pneumothorax.  Stable cardiomegaly and LEFT lung base atelectasis.    Electronically Signed   By: Awilda Metro   On: 07/07/2014 01:17   Dg Chest Port 1 View  07/06/2014   CLINICAL DATA:  Hypotension, history of COPD, hypertension, shortness of breath and pneumonia.  EXAM: PORTABLE CHEST - 1 VIEW  COMPARISON:  Chest radiograph January 30, 2014  FINDINGS: Cardiac silhouette is upper limits of normal in size. Tortuous, mildly calcified aorta. Strandy densities LEFT lung base. No pleural effusion or focal consolidation. No pneumothorax. Edit  Coil densities in the neck are unchanged, and may be vascular. Status post RIGHT humeral arthroplasty. Bilateral distal clavicle resection, severe degenerative change LEFT shoulder.  IMPRESSION: Borderline cardiomegaly. LEFT lung base atelectasis versus scarring.   Electronically Signed   By: Awilda Metro   On: 07/06/2014 22:51   Dg Abd Portable 1v  07/07/2014   CLINICAL DATA:  Abdominal pain.  NG tube placement.  EXAM: PORTABLE ABDOMEN - 1 VIEW  COMPARISON:  01/31/2014  FINDINGS: The enteric tube tip projects over the EG junction region with proximal side hole projected over the lower esophagus. Appearance consistent with placement in the distal esophagus. Residual contrast material in the stomach and small bowel.  IMPRESSION: Enteric tube tip projects over the distal esophagus. Advancement to the stomach is recommended for better placement.   Electronically Signed   By: Burman Nieves M.D.   On: 07/07/2014 02:52    CXR: left basilar atelectasis, no effusions or consolidation, ? cardiomegaly  ASSESSMENT / PLAN:  NEUROLOGIC A:  - Possible CVA , r/o TIA - Anxiety history, chronic benzo use  - Depression  P:  - neurology consult following, MRi non emregent - hold home zanaflex, valium - given chronic benzo use, add PRN ativan 0.5mg , likely schedule soon to avoid WD  - hold home oxy, start PRN fentanyl -do carotids -MAP goal 70  PULMONARY A:  - COPD P: - Duonebs q6hrs and PRN _DNI  CARDIOVASCULAR A:  -  Inferior ST segment elevation on EKG, without reciprocal changes,r/o by code stemi - History of hypertension - shock, likely distributive, rule out cardiogenic component  P:  - s/p aspirin and nitro, hold further - cycle troponin neg, ecg unchanged  - hold home antihypertensives in setting of shock  - echocardiogram pending - check central venous, low, dc, allow pos balance  RENAL A:  - Acute kidney injury, loose stools P: - IV fluids, with loose stool,s increase - monitor lytes - hold ACEi  further - foley ok for now, may dc in am  -watch for NONAG  GASTROINTESTINAL A:  - colitis, r/o cdiff  - Diarrhea, vomiting - History of PUD - GERD P:  - CT reviewed, high clinical suspicion c-diff, repeat pcr and keep treatment - Send Cdiff PCR again - IV PPI to dc, add pepcid - NPO maintain  HEMATOLOGIC A:  - leukocytosis likely secondary to infection P: - see ID section - trend CBC with pos balance  INFECTIOUS DISEASE A:  - septic shock, likely intra-abdominal source - r/o cdiff, r/o enteric pathogen gastroenteritis P: - cefepime, vancomycin, and flagyl empirically continued oral vanc -repeat cdiff pcr -send stool enterics  ENDOCRINE A:  - hyperglycemia, no hx of diabetes diagnosis P: - Sliding scale insulin   To floor, needs mri at some point, for carotids, to triad, improved, repeat cdfif, continued treatment, allow pos balance further  Mcarthur Rossettianiel J. Tyson AliasFeinstein, MD, FACP Pgr: [phone] [facility]

## 2014-07-07 NOTE — Progress Notes (Signed)
CRITICAL VALUE ALERT  Critical value received: MRSA positive   Date of notification:  07/06/14 Time of notification: 0400  Critical value read back:yes  Nurse who received alert: Hazel Samshristian Smith  MD notified (1st page): nursing protocol implemented   Time of first page:   MD notified (2nd page):  Time of second page:  Responding MD:   Time MD responded:

## 2014-07-07 NOTE — Procedures (Signed)
Central Venous Catheter Insertion Procedure Note [place] PoliZula L [place] [id number] [birth year]  Procedure: Insertion of Central Venous Catheter Indications: Assessment of intravascular volume, Drug and/or fluid administration and Frequent blood sampling  Procedure Details Consent: Risks of procedure as well as the alternatives and risks of each were explained to the (patient/caregiver).  Consent for procedure obtained. Time Out: Verified patient identification, verified procedure, site/side was marked, verified correct patient position, special equipment/implants available, medications/allergies/relevent history reviewed, required imaging and test results available.  Performed  Maximum sterile technique was used including antiseptics, cap, gloves, gown, hand hygiene, mask and sheet. Skin prep: Chlorhexidine; local anesthetic administered A antimicrobial bonded/coated triple lumen catheter was placed in the left subclavian vein using the Seldinger technique.  Evaluation Blood flow good Complications: No apparent complications Patient did tolerate procedure well. Chest X-ray ordered to verify placement.  CXR: normal.  Pershing CoxJudy Astou Lada, M.D. Pulmonary and Critical Care Medicine Pager [phone] Call E-link with questions [phone] 07/07/2014, 12:59 AM

## 2014-07-07 NOTE — Progress Notes (Signed)
eLink Physician-Brief Progress Note Patient Name: Veronica Key DOB: 12-20-1940 MRN: [id number]   Date of Service  07/07/2014  HPI/Events of Note  CT abdomen with evidence for colitis, no perf or other findings. Will add enteral vanco to IV flagyl. Continue IV vanco and cefepime for now.   eICU Interventions       Intervention Category Major Interventions: Infection - evaluation and management  BYRUM,ROBERT S. 07/07/2014, 5:09 AM

## 2014-07-07 NOTE — Progress Notes (Signed)
ANTIBIOTIC CONSULT NOTE - INITIAL  Pharmacy Consult for Vancomycin/Cefepime  Indication: rule out sepsis, ?intra-abdominal source  Allergies  Allergen Reactions  . Adhesive [Tape]     Itching and redness around site;Pt requests paper tape  . Cephalexin Nausea And Vomiting    Patient Measurements: ~65 kg  Vital Signs: Temp: 98.2 F (36.8 C) (12/12 2229) Temp Source: Oral (12/12 2245) BP: 127/58 mmHg (12/13 0130) Pulse Rate: 64 (12/13 0050)  Labs:  Recent Labs  07/06/14 2143 07/06/14 2153  WBC 14.8*  --   HGB 15.0 17.0*  PLT 280  --   CREATININE 0.84 1.10   Assessment: 73 y/o F here with abdominal pain, starting broad spectrum antibiotics for r/o sepsis with possible abdominal source. WBC elevated. Renal function ok for age. Other labs as above.   Goal of Therapy:  Vancomycin trough level 15-20 mcg/ml  Plan:  -Vancomycin 1250 mg IV x 1 given in ED, then 500 mg IV q12h -Cefepime 2g IV q12h  -Flagyl per MD -Trend WBC, temp, renal function  -Drug levels as indicated   Abran DukeLedford, Parlee Amescua 07/07/2014,2:10 AM

## 2014-07-08 ENCOUNTER — Inpatient Hospital Stay (HOSPITAL_COMMUNITY): Payer: Medicare Other

## 2014-07-08 ENCOUNTER — Encounter (HOSPITAL_COMMUNITY): Payer: Self-pay | Admitting: General Practice

## 2014-07-08 DIAGNOSIS — R1084 Generalized abdominal pain: Secondary | ICD-10-CM

## 2014-07-08 DIAGNOSIS — I952 Hypotension due to drugs: Secondary | ICD-10-CM

## 2014-07-08 DIAGNOSIS — I472 Ventricular tachycardia: Secondary | ICD-10-CM

## 2014-07-08 DIAGNOSIS — R6521 Severe sepsis with septic shock: Secondary | ICD-10-CM

## 2014-07-08 DIAGNOSIS — I471 Supraventricular tachycardia: Secondary | ICD-10-CM

## 2014-07-08 DIAGNOSIS — I369 Nonrheumatic tricuspid valve disorder, unspecified: Secondary | ICD-10-CM

## 2014-07-08 DIAGNOSIS — A419 Sepsis, unspecified organism: Secondary | ICD-10-CM

## 2014-07-08 DIAGNOSIS — K56609 Unspecified intestinal obstruction, unspecified as to partial versus complete obstruction: Secondary | ICD-10-CM

## 2014-07-08 HISTORY — DX: Unspecified intestinal obstruction, unspecified as to partial versus complete obstruction: K56.609

## 2014-07-08 LAB — CBC WITH DIFFERENTIAL/PLATELET
BASOS ABS: 0 10*3/uL (ref 0.0–0.1)
BASOS PCT: 0 % (ref 0–1)
EOS ABS: 0 10*3/uL (ref 0.0–0.7)
Eosinophils Relative: 0 % (ref 0–5)
HCT: 35.3 % — ABNORMAL LOW (ref 36.0–46.0)
HEMOGLOBIN: 11.8 g/dL — AB (ref 12.0–15.0)
Lymphocytes Relative: 12 % (ref 12–46)
Lymphs Abs: 0.8 10*3/uL (ref 0.7–4.0)
MCH: 28.9 pg (ref 26.0–34.0)
MCHC: 33.4 g/dL (ref 30.0–36.0)
MCV: 86.5 fL (ref 78.0–100.0)
Monocytes Absolute: 0.8 10*3/uL (ref 0.1–1.0)
Monocytes Relative: 12 % (ref 3–12)
NEUTROS PCT: 76 % (ref 43–77)
Neutro Abs: 5.1 10*3/uL (ref 1.7–7.7)
PLATELETS: 263 10*3/uL (ref 150–400)
RBC: 4.08 MIL/uL (ref 3.87–5.11)
RDW: 14 % (ref 11.5–15.5)
WBC: 6.8 10*3/uL (ref 4.0–10.5)

## 2014-07-08 LAB — COMPREHENSIVE METABOLIC PANEL
ALK PHOS: 69 U/L (ref 39–117)
ALT: 8 U/L (ref 0–35)
AST: 14 U/L (ref 0–37)
Albumin: 2.5 g/dL — ABNORMAL LOW (ref 3.5–5.2)
Anion gap: 12 (ref 5–15)
BUN: 17 mg/dL (ref 6–23)
CALCIUM: 8.8 mg/dL (ref 8.4–10.5)
CO2: 22 mEq/L (ref 19–32)
Chloride: 103 mEq/L (ref 96–112)
Creatinine, Ser: 0.76 mg/dL (ref 0.50–1.10)
GFR calc non Af Amer: 83 mL/min — ABNORMAL LOW (ref >90)
Glucose, Bld: 134 mg/dL — ABNORMAL HIGH (ref 70–99)
POTASSIUM: 3.1 meq/L — AB (ref 3.7–5.3)
Sodium: 137 mEq/L (ref 137–147)
TOTAL PROTEIN: 5.6 g/dL — AB (ref 6.0–8.3)
Total Bilirubin: 0.5 mg/dL (ref 0.3–1.2)

## 2014-07-08 LAB — GLUCOSE, CAPILLARY
GLUCOSE-CAPILLARY: 177 mg/dL — AB (ref 70–99)
GLUCOSE-CAPILLARY: 95 mg/dL (ref 70–99)
Glucose-Capillary: 156 mg/dL — ABNORMAL HIGH (ref 70–99)
Glucose-Capillary: 143 mg/dL — ABNORMAL HIGH (ref 70–99)
Glucose-Capillary: 148 mg/dL — ABNORMAL HIGH (ref 70–99)
Glucose-Capillary: 160 mg/dL — ABNORMAL HIGH (ref 70–99)

## 2014-07-08 LAB — URINE CULTURE
Colony Count: NO GROWTH
Culture: NO GROWTH

## 2014-07-08 LAB — LACTIC ACID, PLASMA: Lactic Acid, Venous: 1.9 mmol/L (ref 0.5–2.2)

## 2014-07-08 LAB — MAGNESIUM: MAGNESIUM: 2.1 mg/dL (ref 1.5–2.5)

## 2014-07-08 LAB — TROPONIN I

## 2014-07-08 MED ORDER — SODIUM CHLORIDE 0.9 % IJ SOLN
10.0000 mL | INTRAMUSCULAR | Status: DC | PRN
  Administered 2014-07-08: 10 mL
  Administered 2014-07-08 – 2014-07-09 (×3): 20 mL
  Filled 2014-07-08 (×3): qty 40

## 2014-07-08 MED ORDER — LORAZEPAM 2 MG/ML IJ SOLN
1.0000 mg | Freq: Once | INTRAMUSCULAR | Status: AC
  Administered 2014-07-08: 1 mg via INTRAVENOUS
  Filled 2014-07-08: qty 1

## 2014-07-08 MED ORDER — POTASSIUM CHLORIDE 10 MEQ/100ML IV SOLN
10.0000 meq | INTRAVENOUS | Status: AC
  Administered 2014-07-08 (×4): 10 meq via INTRAVENOUS
  Filled 2014-07-08 (×4): qty 100

## 2014-07-08 MED ORDER — LABETALOL HCL 100 MG PO TABS
100.0000 mg | ORAL_TABLET | Freq: Two times a day (BID) | ORAL | Status: DC
  Administered 2014-07-08 – 2014-07-09 (×2): 100 mg via ORAL
  Filled 2014-07-08 (×3): qty 1

## 2014-07-08 MED ORDER — LABETALOL HCL 100 MG PO TABS
100.0000 mg | ORAL_TABLET | Freq: Two times a day (BID) | ORAL | Status: DC
  Administered 2014-07-08: 100 mg via ORAL
  Filled 2014-07-08 (×2): qty 1

## 2014-07-08 MED ORDER — IOHEXOL 350 MG/ML SOLN
50.0000 mL | Freq: Once | INTRAVENOUS | Status: AC | PRN
  Administered 2014-07-08: 50 mL via INTRAVENOUS

## 2014-07-08 MED ORDER — ASPIRIN 81 MG PO CHEW
81.0000 mg | CHEWABLE_TABLET | Freq: Every day | ORAL | Status: DC
  Administered 2014-07-08 – 2014-07-09 (×2): 81 mg via ORAL
  Filled 2014-07-08 (×2): qty 1

## 2014-07-08 MED ORDER — MAGNESIUM SULFATE 2 GM/50ML IV SOLN
2.0000 g | Freq: Once | INTRAVENOUS | Status: AC
Start: 2014-07-08 — End: 2014-07-08
  Administered 2014-07-08: 2 g via INTRAVENOUS
  Filled 2014-07-08: qty 50

## 2014-07-08 MED ORDER — ALBUTEROL SULFATE (2.5 MG/3ML) 0.083% IN NEBU: 2.5000 mg | INHALATION_SOLUTION | RESPIRATORY_TRACT | Status: DC | PRN

## 2014-07-08 MED FILL — Heparin Sodium (Porcine) 2 Unit/ML in Sodium Chloride 0.9%: INTRAMUSCULAR | Qty: 500 | Status: AC

## 2014-07-08 NOTE — Progress Notes (Signed)
Medicare Important Message given?  YES (If response is "NO", the following Medicare IM given date fields will be blank) Date Medicare IM given:  07/08/14 Medicare IM given by:  Darrall Strey 

## 2014-07-08 NOTE — Evaluation (Signed)
Clinical/Bedside Swallow Evaluation Patient Details  Name: Veronica Key MRN: [id number] Date of Birth: 1941-05-01  Today's Date: 07/08/2014 Time: [id number] SLP Time Calculation (min) (ACUTE ONLY): 20 min  Past Medical History:  Past Medical History  Diagnosis Date  . GERD (gastroesophageal reflux disease)     takes Protonix daily  . Urinary frequency     takes Ditropan daily  . Depression     takes Cymbalta daily  . Anxiety     takes Valium daily  . COPD (chronic obstructive pulmonary disease)     uses ALbuterol prn daily  . Hypertension     takes Amlodipine,Benazepril,and Labetalol daily  . Shortness of breath     with exertion  . History of bronchitis     last time in 1976  . Pneumonia     hx of-many yrs ago  . Headache(784.0)     occasionally  . Numbness     both feet  . Arthritis   . Joint pain   . Peripheral edema     but not on any meds  . Chronic back pain     herniated disc and stenosis  . Carpal tunnel syndrome   . H/O hiatal hernia   . History of gastric ulcer   . Urinary urgency   . History of kidney stones   . Asthma   . COPD (chronic obstructive pulmonary disease)    Past Surgical History:  Past Surgical History  Procedure Laterality Date  . Cholecystectomy    . Abdominal hysterectomy    . Bladder tacked    . Shoulder surgery Bilateral   . Joint replacement Bilateral     knee   . Cataract surgery Bilateral   . Epidural block injection    . Esophagogastroduodenoscopy    . Lumbar laminectomy/decompression microdiscectomy N/A 10/02/2013    Procedure: LUMBAR ONE-TWO LAMINECTOMY/DECOMPRESSION MICRODISCECTOMY ;  Surgeon: Barnett AbuHenry Elsner, MD;  Location: [facility];  Service: Neurosurgery;  Laterality: N/A;  L1-2 Laminectomy  . Eye surgery      cataracts  . Spine surgery     HPI:  73 y/o woman with history of SBO in July, peptic ulcer disease and GERD, who presents from her assisted living facility after syncopal event subsequent to vomiting and  abdominal pain. Pt with facial droop, CT negative for acute process. Pt with septic shock, like GI source per MD. CT abdomen shows no SBO. NG tube shown to be in the distal esophagus.    Assessment / Plan / Recommendation Clinical Impression   Pt demonstrates adequate oropharyngeal swallow function. SLP provided oral care, as pt was experiencing xerostomia and increased mucous membrane coating of her oral cavity and lips due to NPO status and oxygen therapy (nasal cannula). This caused some mildly increased residue with solid textures but was cleared with a liquid wash. Pt reports a history of GERD and states that she does sometimes regurgitate her food. She has had a dilatation of her esophagus and has a history of esophageal and GI issues (small bowel obstruction, currently experiencing colitis). Although her oropharyngeal swallow function is adequate she is still at an increased risk of aspiration and aspiration related complications due to hx of reflux and poor oral hygiene. Recommend regular diet with thin liquids taken when sitting upright and after oral care. Defer diet initiation to managing physician as pt is currently NPO for gastrointestinal issues.    Aspiration Risk  Mild    Diet Recommendation Regular;Thin liquid   Liquid Administration  via: Straw;Cup Medication Administration: Whole meds with liquid Supervision: Intermittent supervision to cue for compensatory strategies Compensations: Slow rate;Small sips/bites Postural Changes and/or Swallow Maneuvers: Seated upright 90 degrees;Upright 30-60 min after meal    Other  Recommendations Oral Care Recommendations: Oral care Q4 per protocol   Follow Up Recommendations       Frequency and Duration        Pertinent Vitals/Pain none    SLP Swallow Goals     Swallow Study Prior Functional Status       General Date of Onset: 07/06/14 HPI: 73 y/o woman with history of SBO in July, peptic ulcer disease and GERD, who presents  from her assisted living facility after syncopal event subsequent to vomiting and abdominal pain. Pt with facial droop, CT negative for acute process. Pt with septic shock, like GI source per MD. CT abdomen shows no SBO. NG tube shown to be in the distal esophagus.  Type of Study: Bedside swallow evaluation Previous Swallow Assessment: no Diet Prior to this Study: NPO Temperature Spikes Noted: No Respiratory Status: Nasal cannula History of Recent Intubation: No Behavior/Cognition: Alert;Cooperative;Pleasant mood Oral Cavity - Dentition: Dentures, top;Dentures, bottom Self-Feeding Abilities: Able to feed self Patient Positioning: Upright in bed Baseline Vocal Quality: Clear Volitional Cough: Strong Volitional Swallow: Able to elicit    Oral/Motor/Sensory Function Overall Oral Motor/Sensory Function: Appears within functional limits for tasks assessed   Ice Chips     Thin Liquid Thin Liquid: Within functional limits Presentation: Cup;Straw    Nectar Thick     Honey Thick     Puree Puree: Within functional limits   Solid   GO    Solid: Within functional limits Presentation: Self Fed Other Comments: mild residue, due to dryness of oral cavity       Veronica Key, Veronica Key 07/08/2014,9:42 AM

## 2014-07-08 NOTE — Progress Notes (Signed)
  Echocardiogram 2D Echocardiogram has been performed.  Veronica Key 07/08/2014, 11:20 AM

## 2014-07-08 NOTE — Consult Note (Signed)
CARDIOLOGY CONSULT NOTE   Patient ID: Veronica Key MRN: [id number], DOB/AGE: 1940-10-17   Admit date: 07/06/2014 Date of Consult: 07/08/2014   Primary Physician: [name], PA-C Primary Cardiologist: New  Pt. Profile  73 year old female with history of hypertension, COPD, peptic ulcer disease, GERD, anxiety and diabetes present with presyncope, nausea and diarrhea, found to have severe hypotension and possible colitis on CT. Overnight had some supraventricular tachycardia and NSVT on telemetry  Problem List  Past Medical History  Diagnosis Date  . GERD (gastroesophageal reflux disease)     takes Protonix daily  . Urinary frequency     takes Ditropan daily  . Depression     takes Cymbalta daily  . Anxiety     takes Valium daily  . COPD (chronic obstructive pulmonary disease)     uses ALbuterol prn daily  . Hypertension     takes Amlodipine,Benazepril,and Labetalol daily  . Shortness of breath     with exertion  . History of bronchitis     last time in 1976  . Pneumonia     hx of-many yrs ago  . Headache(784.0)     occasionally  . Numbness     both feet  . Arthritis   . Joint pain   . Peripheral edema     but not on any meds  . Chronic back pain     herniated disc and stenosis  . Carpal tunnel syndrome   . H/O hiatal hernia   . History of gastric ulcer   . Urinary urgency   . History of kidney stones   . Asthma   . COPD (chronic obstructive pulmonary disease)   . Bowel obstruction 07/08/2014  . Diabetes mellitus without complication     Past Surgical History  Procedure Laterality Date  . Cholecystectomy    . Abdominal hysterectomy    . Bladder tacked    . Shoulder surgery Bilateral   . Joint replacement Bilateral     knee   . Cataract surgery Bilateral   . Epidural block injection    . Esophagogastroduodenoscopy    . Lumbar laminectomy/decompression microdiscectomy N/A 10/02/2013    Procedure: LUMBAR ONE-TWO LAMINECTOMY/DECOMPRESSION  MICRODISCECTOMY ;  Surgeon: Barnett Abu, MD;  Location: [facility];  Service: Neurosurgery;  Laterality: N/A;  L1-2 Laminectomy  . Eye surgery      cataracts  . Spine surgery       Allergies  Allergies  Allergen Reactions  . Adhesive [Tape]     Itching and redness around site;Pt requests paper tape  . Cephalexin Nausea And Vomiting    HPI   The patient is a frail 73 year old female with history of hypertension, COPD, peptic ulcer disease, GERD, anxiety and DM. She has no prior cardiac history and has not seen a cardiologist before. Due to her mental status, most with the history was obtained from medical record. It appears patient has some episodes of nausea and diarrhea prior to her presentation. On the night of 07/06/2014, she was in her normal state of health around 8:30 PM at her assisted-living facility. She was subsequently found to have a presyncopal episode around 8:45 PM. EMS was called, en route to the hospital, she was noted to have some facial droop, dysarthria, and ST elevation in the inferior lead, code stroke and STEMI was called.   On arrival to the hospital, she was found to be profoundly hypotensive with blood pressure 82/58. Pulse 62. EKG was reviewed by cardiology, due to  the lack of chest pain, it was felt this is not true ACS, and code STEMI was canceled. She was seen by neurology group for dysarthria and it was felt she likely has cerebral ischemia in the setting of hypotension, therefore code stroke was canceled as well. Initial laboratory finding was significant for sodium 129, potassium 3.7, creatinine 0.84, troponin negative, white blood cell count 14.8, CT of the head showed no acute intracranial process, moderate to severe white matter changes consistent with chronic small vessel ischemic disease. EtOH negative. She was mildly febrile with MAXIMUM TEMPERATURE 100. Initial lactic acid was elevated at greater than 3. Code sepsis was called and patient was treated for  septic shock. Blood culture was ordered and is negative to date with final result pending. Since patient has some diarrhea, C. difficile was also ordered which was negative. Urine toxicity was positive for amphetamine and benzo. TSH normal. Urinalysis negative for infection. CT of the abdomen and pelvis was obtained on 12/13 which showed wall thickening in the transverse, descending and rectosigmoid colon consistent with nonspecific colitis. Surgery was consulted. Patient was placed on NG tube and flexseal. Per surgery, she likely does not need urgent surgical intervention at this time. She is currently placed on antibiotics.   Overnight, telemetry noted patient has been having some tachycardic episode with heart rate in the 100 to 110s and bursts of supraventricular tachycardia. She also had an episode of 15 beats run of nonsustained VT. Cardiology has been consulted for tachycardia.  Inpatient Medications  . antiseptic oral rinse  7 mL Mouth Rinse BID  . aspirin  81 mg Oral Daily  . ceFEPime (MAXIPIME) IV  2 g Intravenous Q12H  . Chlorhexidine Gluconate Cloth  6 each Topical Q0600  . DULoxetine  60 mg Oral Daily  . famotidine (PEPCID) IV  20 mg Intravenous Q24H  . heparin  5,000 Units Subcutaneous 3 times per day  . insulin aspart  0-9 Units Subcutaneous 6 times per day  . labetalol  100 mg Oral BID  . metronidazole  500 mg Intravenous Q8H  . mupirocin ointment  1 application Nasal BID  . oxybutynin  10 mg Oral Daily    Family History Family History  Problem Relation Age of Onset  . Diabetes Mother   . Hypertension Mother   . Arthritis Father   . Heart disease Father   . Arthritis Brother   . Early death Son   . Arthritis Brother   . Arthritis Brother      Social History History   Social History  . Marital Status: Widowed    Spouse Name: N/A    Number of Children: N/A  . Years of Education: N/A   Occupational History  . Not on file.   Social History Main Topics  .  Smoking status: Current Every Day Smoker -- 0.50 packs/day for 35 years  . Smokeless tobacco: Never Used  . Alcohol Use: No  . Drug Use: No  . Sexual Activity: Yes    Birth Control/ Protection: Surgical   Other Topics Concern  . Not on file   Social History Narrative     Review of Systems  General:  No chills, fever, night sweats or weight changes.  Cardiovascular:  No chest pain, dyspnea on exertion, edema, orthopnea, palpitations, paroxysmal nocturnal dyspnea. Dermatological: No rash, lesions/masses Respiratory: No cough, dyspnea Urologic: No hematuria, dysuria Abdominal:   No vomiting, bright red blood per rectum, melena, or hematemesis. + Nausea,  diarrhea Neurologic:  No visual changes, changes in mental status. +wkns All other systems reviewed and are otherwise negative except as noted above.  Physical Exam  Blood pressure 148/58, pulse 94, temperature 99 F (37.2 C), temperature source Oral, resp. rate 32, height 4\' 11"  (1.499 m), weight 166 lb 7.2 oz (75.5 kg), SpO2 97 %.  General: Pleasant, NAD Psych: Normal affect. Neuro: Alert and oriented X 3. Moves all extremities spontaneously. HEENT: Normal  Neck: Supple without bruits or JVD.  Lungs:  Resp regular and unlabored, anterior exam CTA. Heart: RRR no s3, s4, or murmurs. Abdomen: Soft, non-tender, non-distended, BS + x 4. NG tube in place.  Extremities: No clubbing, cyanosis or edema. DP/PT/Radials 2+ and equal bilaterally.  Labs   Recent Labs  07/06/14 2143 07/07/14 0100 07/07/14 0612  TROPONINI <0.30 <0.30 <0.30   Lab Results  Component Value Date   WBC 6.8 07/08/2014   HGB 11.8* 07/08/2014   HCT 35.3* 07/08/2014   MCV 86.5 07/08/2014   PLT 263 07/08/2014    Recent Labs Lab 07/08/14 0815  NA 137  K 3.1*  CL 103  CO2 22  BUN 17  CREATININE 0.76  CALCIUM 8.8  PROT 5.6*  BILITOT 0.5  ALKPHOS 69  ALT 8  AST 14  GLUCOSE 134*   No results found for: CHOL, HDL, LDLCALC, TRIG Lab Results    Component Value Date   DDIMER * 03/01/2010    1.13        AT THE INHOUSE ESTABLISHED CUTOFF VALUE OF 0.48 ug/mL FEU, THIS ASSAY HAS BEEN DOCUMENTED IN THE LITERATURE TO HAVE A SENSITIVITY AND NEGATIVE PREDICTIVE VALUE OF AT LEAST 98 TO 99%.  THE TEST RESULT SHOULD BE CORRELATED WITH AN ASSESSMENT OF THE CLINICAL PROBABILITY OF DVT / VTE.    Radiology/Studies  Ct Abdomen Pelvis Wo Contrast  07/07/2014   CLINICAL DATA:  Nausea and vomiting. Lower abdominal pain. History of small bowel obstructions.  EXAM: CT ABDOMEN AND PELVIS WITHOUT CONTRAST  TECHNIQUE: Multidetector CT imaging of the abdomen and pelvis was performed following the standard protocol without IV contrast.  COMPARISON:  01/30/2014  FINDINGS: Atelectasis in the lung bases. Coronary artery calcifications. There appears to be an enteric tube with tip in the distal esophagus. Suggest advancement of the stomach for better positioning.  The unenhanced appearance of the liver, spleen, pancreas, kidneys, inferior vena cava, and retroperitoneal lymph nodes is unremarkable. Calcification of the abdominal aorta without aneurysm. Right adrenal gland nodule measures 2.8 cm diameter. Density measurements consistent with fat. 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 No change since prior study. Surgical absence of the gallbladder. Mild bile duct dilatation is likely physiologic. Stomach appears normal. Old thickening in the small bowel suggesting enteritis. No evidence of small bowel dilatation. Colon is diffusely stool-filled with evidence of wall thickening in the transverse and descending portions. Mild pericolonic infiltration. This suggests colitis, likely of infectious or inflammatory etiology. No free air or free fluid in the abdomen.  Pelvis: Foley catheter in the bladder. Thickening of the wall of the rectosigmoid colon with pericolonic infiltration consistent with proctitis and colitis. There appears to be a balloon catheter in the rectum. Small  amount of free fluid in the pelvis is probably reactive. Appendix is not identified. No pelvic mass or lymphadenopathy. Degenerative changes in the lumbar spine. No destructive bone lesions appreciated.  IMPRESSION: Wall thickening in the transverse, descending, and rectosigmoid colon consistent with nonspecific colitis, likely infectious or inflammatory. Small bowel fold thickening suggest enteritis. No evidence  of small bowel obstruction. Enteric tube tip is in the distal esophagus and should be advanced if placement in the stomach is desired. Foley and rectal catheters are present.   Electronically Signed   By: Burman NievesWilliam  Stevens M.D.   On: 07/07/2014 03:29   Ct Angio Head W/cm &/or Wo Cm  07/08/2014   CLINICAL DATA:  Code stroke with LEFT brain TIA, 07/06/2014, now resolved. Slight headache.  EXAM: CT ANGIOGRAPHY HEAD  TECHNIQUE: Multidetector CT imaging of the head was performed using the standard protocol during bolus administration of intravenous contrast. Multiplanar CT image reconstructions and MIPs were obtained to evaluate the vascular anatomy.  CONTRAST:  50mL OMNIPAQUE IOHEXOL 350 MG/ML SOLN  COMPARISON:  CT head performed 07/06/2014.  FINDINGS: The RIGHT internal carotid artery displays wide patency in its upper cervical and segment. There is a 50% inferior cavernous stenosis which is calcific. Mild supraclinoid calcification also estimated 50% stenosis.  On the LEFT, there is a 50% distal cervical calcific ICA stenosis. There is also mild non stenotic cavernous and supraclinoid atherosclerotic calcification.  The basilar is widely patent with both vertebrals contributing, LEFT slightly larger. Minimal distal LEFT vertebral calcification, non stenotic.  There is no proximal stenosis of the anterior, middle, or posterior cerebral arteries no MCA or PCA branch occlusion. No cerebellar branch occlusion. Generalized atrophy with hypoattenuation white matter representing small vessel disease is  redemonstrated. No abnormal postcontrast enhancement. Major dural venous sinuses are patent.  Review of the MIP images confirms the above findings.  IMPRESSION: No flow reducing lesion of the intracranial circulation is evident.   Electronically Signed   By: Davonna BellingJohn  Curnes M.D.   On: 07/08/2014 15:27   Dg Abd 1 View  07/08/2014   CLINICAL DATA:  Abdominal pain and vomiting. Evaluate nasogastric tube. Colitis.  EXAM: ABDOMEN - 1 VIEW  COMPARISON:  07/07/2014  FINDINGS: Nasogastric tube in the upper stomach region. There is oral contrast in the right colon. Right colon is dilated but similar to the recent CT. Few dilated loops of small bowel. Evidence for a rectal catheter. Mild scoliosis in lumbar spine with multilevel disc disease.  IMPRESSION: Nasogastric tube in the upper stomach region.  The oral contrast has advanced into the right colon. There continues to be dilatation of the right colon. Dilatation is probably secondary to the wall thickening in the left colon and colitis. Findings could represent at least a partial colonic obstruction.   Electronically Signed   By: Richarda OverlieAdam  Henn M.D.   On: 07/08/2014 14:39   Ct Head Wo Contrast  07/06/2014   CLINICAL DATA:  Found down, last seen normal at 2030 hr, altered mental status, LEFT-sided weakness and slurred speech.  EXAM: CT HEAD WITHOUT CONTRAST  TECHNIQUE: Contiguous axial images were obtained from the base of the skull through the vertex without intravenous contrast.  COMPARISON:  CT of the head March 01, 2010  FINDINGS: Mild motion degraded examination.  The ventricles and sulci are normal for age. No intraparenchymal hemorrhage, mass effect nor midline shift. Patchy supratentorial and pontine white matter hypodensities are within normal range for patient's age and though non-specific suggest sequelae of chronic small vessel ischemic disease. No acute large vascular territory infarcts.  No abnormal extra-axial fluid collections. Basal cisterns are patent.  Moderate calcific atherosclerosis of the carotid siphons and included vertebra arteries.  No skull fracture. The included ocular globes and orbital contents are non-suspicious. Status post bilateral ocular lens implants. RIGHT sphenoid mucosal retention cyst without paranasal sinus  air-fluid levels. Patient is edentulous. Soft tissue within the external auditory canals most consistent with cerumen. 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  IMPRESSION: No acute intracranial process on this mildly motion degraded examination.  Moderate to severe white matter changes suggest chronic small vessel ischemic disease. If clinical concern persists for acute ischemia, MRI of the brain with diffusion-weighted sequences would be more sensitive.  Acute findings discussed with and reconfirmed by Dr.Kirkpatrick on 07/06/2014 at 9:55 pm.   Electronically Signed   By: Awilda Metro   On: 07/06/2014 22:05   Dg Chest Portable 1 View  07/07/2014   CLINICAL DATA:  Central line placement.  EXAM: PORTABLE CHEST - 1 VIEW  COMPARISON:  Chest radiograph July 06, 2014  FINDINGS: Interval placement of LEFT subclavian central venous catheter with distal tip projecting in mid superior vena cava. No pneumothorax.  The cardiac silhouette is upper limits of normal in size. Mediastinal silhouette is nonsuspicious, mildly calcified aortic knob. Strandy density persists in LEFT lung base. No pleural effusion. RIGHT humeral arthroplasty. Severe degenerative change of the LEFT shoulder, LEFT distal clavicle osteotomy.  IMPRESSION: Interval placement of LEFT subclavian central venous catheter, distal tip projecting in mid superior vena cava. No pneumothorax.  Stable cardiomegaly and LEFT lung base atelectasis.   Electronically Signed   By: Awilda Metro   On: 07/07/2014 01:17   Dg Chest Port 1 View  07/06/2014   CLINICAL DATA:  Hypotension, history of COPD, hypertension, shortness of breath and pneumonia.  EXAM: PORTABLE  CHEST - 1 VIEW  COMPARISON:  Chest radiograph January 30, 2014  FINDINGS: Cardiac silhouette is upper limits of normal in size. Tortuous, mildly calcified aorta. Strandy densities LEFT lung base. No pleural effusion or focal consolidation. No pneumothorax. Edit  Coil densities in the neck are unchanged, and may be vascular. Status post RIGHT humeral arthroplasty. Bilateral distal clavicle resection, severe degenerative change LEFT shoulder.  IMPRESSION: Borderline cardiomegaly. LEFT lung base atelectasis versus scarring.   Electronically Signed   By: Awilda Metro   On: 07/06/2014 22:51   Dg Abd Portable 1v  07/07/2014   CLINICAL DATA:  Abdominal pain.  NG tube placement.  EXAM: PORTABLE ABDOMEN - 1 VIEW  COMPARISON:  01/31/2014  FINDINGS: The enteric tube tip projects over the EG junction region with proximal side hole projected over the lower esophagus. Appearance consistent with placement in the distal esophagus. Residual contrast material in the stomach and small bowel.  IMPRESSION: Enteric tube tip projects over the distal esophagus. Advancement to the stomach is recommended for better placement.   Electronically Signed   By: Burman Nieves M.D.   On: 07/07/2014 02:52    ECG  NSR with HR 80s  ASSESSMENT AND PLAN  1. Tachycardia: appears to be mostly sinus, however also have a component SVT given frequent short bursts.  - likely driven by underlying colitis, would focus on treating the cause  - previously on labetalol and benazepril at home, both held on arrival given shock and nausea, patient has tolerated gello diet today, PO medication has resumed. Agree with holding benazepril and restart labetalol which should help with her tachycardic episodes  - Echo 07/08/2014 EF 60-65%, no RWMA. Echo reassuring  - EKG shows mild ST elevation in inferior lead, however patient continue to denies any CP, interesting there was no ST changes on EKG from Aug 2014, however given lack of CP and negative trop,  low likelihood of ACS, may have demand ischemia when her BP in the  80s  2. 1 episode of 15 beats run of NSVT  - continue to monitor, labetalol should help. If has frequent recurrence, can consider amiodarone.   3. Dysarthria/Facial droop/presyncope  - seen by neurology, felt to be related to cerebral ischemia in the setting of severe hypotension  4. Colitis: follow by surgery, no surgical intervention at this time, recommended GI consult  - appear to be improving, tolerating PO med today.   5. h/o hypertension 6. COPD 7. peptic ulcer disease 8. GERD 9. DM    Signed, Azalee Course, PA-C 07/08/2014, 4:45 PM Patient seen and examined and history reviewed. Agree with above findings and plan. 73 yo WF admitted with multiple medical problems including acute colitis, hypotension, and dysarthria/facial droop. Due to hypotension home BP meds held including labetalol. Initially called a Code Stemi on admit but this was cancelled and subsequent cardiac enzymes were normal. Echo was normal. Now having frequent runs of SVT (probably PAT). One run of NSVT 15 beats. She is asymptomatic. I think her arrhythmia is related to the stress of her acute medical illnesses as well as withdrawal from beta blocker. Now that she is tolerating po's and BP is improved we can resume labetalol and titrate to effect. Also need to replete potassium to > 4.0 and Magnesium to > 2.   Nathaneil Feagans [place], MDFACC 07/08/2014 5:34 PM

## 2014-07-08 NOTE — Consult Note (Signed)
Veronica Key Jan 01, 1941  [id number].   Requesting MD: Dr. Niel Hummer Chief Complaint/Reason for Consult: large bowel obstruction HPI: This is a 73 yo frail female who said her caretaker called 911 due to a syncopal episode.  The patient does not really recall this and is unable to provide much history at all.  She denied abdominal pain yesterday.  Apparently she had an episode of nausea and diarrhea just prior to her syncopal episode.  She was admitted to [facility] initially due to some hypotension.  She had a CT of her Abd/Pel that revealed some nonspecific colitis of her transverse, descending and rectosigmoid colon.  She had an NGT in place and a flexiseal in place with watery brown stool.  c diff is negative.  A film today shows distention of her right colon likely secondary to wall thickening from her colitis.  We have been asked to see her.  ROS : difficult to obtain as patient a very poor historian  Family History  Problem Relation Age of Onset  . Diabetes Mother   . Hypertension Mother   . Arthritis Father   . Heart disease Father   . Arthritis Brother   . Early death Son   . Arthritis Brother   . Arthritis Brother     Past Medical History  Diagnosis Date  . GERD (gastroesophageal reflux disease)     takes Protonix daily  . Urinary frequency     takes Ditropan daily  . Depression     takes Cymbalta daily  . Anxiety     takes Valium daily  . COPD (chronic obstructive pulmonary disease)     uses ALbuterol prn daily  . Hypertension     takes Amlodipine,Benazepril,and Labetalol daily  . Shortness of breath     with exertion  . History of bronchitis     last time in 1976  . Pneumonia     hx of-many yrs ago  . Headache(784.0)     occasionally  . Numbness     both feet  . Arthritis   . Joint pain   . Peripheral edema     but not on any meds  . Chronic back pain     herniated disc and stenosis  . Carpal tunnel syndrome   . H/O hiatal hernia   . History of gastric  ulcer   . Urinary urgency   . History of kidney stones   . Asthma   . COPD (chronic obstructive pulmonary disease)   . Bowel obstruction 07/08/2014  . Diabetes mellitus without complication     Past Surgical History  Procedure Laterality Date  . Cholecystectomy    . Abdominal hysterectomy    . Bladder tacked    . Shoulder surgery Bilateral   . Joint replacement Bilateral     knee   . Cataract surgery Bilateral   . Epidural block injection    . Esophagogastroduodenoscopy    . Lumbar laminectomy/decompression microdiscectomy N/A 10/02/2013    Procedure: LUMBAR ONE-TWO LAMINECTOMY/DECOMPRESSION MICRODISCECTOMY ;  Surgeon: Kristeen Miss, MD;  Location: [facility];  Service: Neurosurgery;  Laterality: N/A;  L1-2 Laminectomy  . Eye surgery      cataracts  . Spine surgery      Social History:  reports that she has been smoking.  She has never used smokeless tobacco. She reports that she does not drink alcohol or use illicit drugs.  Allergies:  Allergies  Allergen Reactions  . Adhesive [Tape]  Itching and redness around site;Pt requests paper tape  . Cephalexin Nausea And Vomiting    Medications Prior to Admission  Medication Sig Dispense Refill  . albuterol (PROVENTIL HFA;VENTOLIN HFA) 108 (90 BASE) MCG/ACT inhaler Inhale 2 puffs into the lungs every 4 (four) hours as needed for wheezing or shortness of breath.    Marland Kitchen albuterol (PROVENTIL) (2.5 MG/3ML) 0.083% nebulizer solution Take 2.5 mg by nebulization 4 (four) times daily as needed for wheezing or shortness of breath.    . benazepril (LOTENSIN) 20 MG tablet Take 20 mg by mouth 2 (two) times daily.    . Cyanocobalamin (VITAMIN B 12 PO) Take 1 tablet by mouth daily.    . diazepam (VALIUM) 5 MG tablet Take 5 mg by mouth 2 (two) times daily.    . DULoxetine (CYMBALTA) 60 MG capsule Take 60 mg by mouth daily.    . hydrochlorothiazide (MICROZIDE) 12.5 MG capsule Take 12.5 mg by mouth daily.    Marland Kitchen labetalol (NORMODYNE) 200 MG  tablet Take 400 mg by mouth 2 (two) times daily.     . meclizine (ANTIVERT) 12.5 MG tablet Take 12.5 mg by mouth 2 (two) times daily as needed for dizziness.    Marland Kitchen oxybutynin (DITROPAN) 5 MG tablet Take 10 mg by mouth daily.    . pantoprazole (PROTONIX) 40 MG tablet Take 40 mg by mouth daily.    Marland Kitchen tiZANidine (ZANAFLEX) 4 MG tablet Take 4 mg by mouth 3 (three) times daily.    Marland Kitchen VITAMIN D, CHOLECALCIFEROL, PO Take 1 tablet by mouth daily.      Blood pressure 148/58, pulse 94, temperature 99 F (37.2 C), temperature source Oral, resp. rate 32, height 4' 11"  (1.499 m), weight 166 lb 7.2 oz (75.5 kg), SpO2 97 %. Physical Exam: General: frail elderly appearing white female who is laying in bed in NAD HEENT: head is normocephalic, atraumatic.  Sclera are noninjected.  PERRL.  Ears and nose without any masses or lesions.  Mouth is pink and moist Heart: regular, rate, and rhythm.  Normal s1,s2. No obvious murmurs, gallops, or rubs noted.  Palpable radial and pedal pulses bilaterally Lungs: CTAB, no wheezes, rhonchi, or rales noted.  Respiratory effort nonlabored Abd: soft, mild diffuse tenderness, no focal tenderness, ND, +BS, no masses, hernias, or organomegaly, several large open scars from prior surgeries.  NGT with clear liquid diet output and flexi-seal with watery brown output MS: all 4 extremities are symmetrical with no cyanosis, clubbing, or edema. Skin: warm and dry with no masses, lesions, or rashes Psych: A&Ox3 with an appropriate affect.    Results for orders placed or performed during the hospital encounter of 07/06/14 (from the past 48 hour(s))  Ethanol     Status: None   Collection Time: 07/06/14  9:43 PM  Result Value Ref Range   Alcohol, Ethyl (B) <11 0 - 11 mg/dL    Comment:        LOWEST DETECTABLE LIMIT FOR SERUM ALCOHOL IS 11 mg/dL FOR MEDICAL PURPOSES ONLY   Protime-INR     Status: Abnormal   Collection Time: 07/06/14  9:43 PM  Result Value Ref Range   Prothrombin Time  15.5 (H) 11.6 - 15.2 seconds   INR 1.22 0.00 - 1.49  APTT     Status: None   Collection Time: 07/06/14  9:43 PM  Result Value Ref Range   aPTT 30 24 - 37 seconds  CBC     Status: Abnormal   Collection Time: 07/06/14  9:43 PM  Result Value Ref Range   WBC 14.8 (H) 4.0 - 10.5 K/uL   RBC 5.05 3.87 - 5.11 MIL/uL   Hemoglobin 15.0 12.0 - 15.0 g/dL   HCT 43.9 36.0 - 46.0 %   MCV 86.9 78.0 - 100.0 fL   MCH 29.7 26.0 - 34.0 pg   MCHC 34.2 30.0 - 36.0 g/dL   RDW 13.7 11.5 - 15.5 %   Platelets 280 150 - 400 K/uL  Differential     Status: Abnormal   Collection Time: 07/06/14  9:43 PM  Result Value Ref Range   Neutrophils Relative % 81 (H) 43 - 77 %   Neutro Abs 12.0 (H) 1.7 - 7.7 K/uL   Lymphocytes Relative 13 12 - 46 %   Lymphs Abs 1.9 0.7 - 4.0 K/uL   Monocytes Relative 4 3 - 12 %   Monocytes Absolute 0.6 0.1 - 1.0 K/uL   Eosinophils Relative 2 0 - 5 %   Eosinophils Absolute 0.3 0.0 - 0.7 K/uL   Basophils Relative 0 0 - 1 %   Basophils Absolute 0.0 0.0 - 0.1 K/uL  Comprehensive metabolic panel     Status: Abnormal   Collection Time: 07/06/14  9:43 PM  Result Value Ref Range   Sodium 129 (L) 137 - 147 mEq/L   Potassium 3.7 3.7 - 5.3 mEq/L   Chloride 90 (L) 96 - 112 mEq/L   CO2 19 19 - 32 mEq/L   Glucose, Bld 150 (H) 70 - 99 mg/dL   BUN 15 6 - 23 mg/dL   Creatinine, Ser 0.84 0.50 - 1.10 mg/dL   Calcium 10.3 8.4 - 10.5 mg/dL   Total Protein 7.4 6.0 - 8.3 g/dL   Albumin 3.9 3.5 - 5.2 g/dL   AST 18 0 - 37 U/L   ALT 11 0 - 35 U/L   Alkaline Phosphatase 134 (H) 39 - 117 U/L   Total Bilirubin 0.7 0.3 - 1.2 mg/dL   GFR calc non Af Amer 68 (L) >90 mL/min   GFR calc Af Amer 79 (L) >90 mL/min    Comment: (NOTE) The eGFR has been calculated using the CKD EPI equation. This calculation has not been validated in all clinical situations. eGFR's persistently <90 mL/min signify possible Chronic Kidney Disease.    Anion gap 20 (H) 5 - 15  Troponin I     Status: None   Collection Time:  07/06/14  9:43 PM  Result Value Ref Range   Troponin I <0.30 <0.30 ng/mL    Comment:        Due to the release kinetics of cTnI, a negative result within the first hours of the onset of symptoms does not rule out myocardial infarction with certainty. If myocardial infarction is still suspected, repeat the test at appropriate intervals.   I-Stat Troponin, ED (not at [facility])     Status: None   Collection Time: 07/06/14  9:49 PM  Result Value Ref Range   Troponin i, poc 0.02 0.00 - 0.08 ng/mL   Comment 3            Comment: Due to the release kinetics of cTnI, a negative result within the first hours of the onset of symptoms does not rule out myocardial infarction with certainty. If myocardial infarction is still suspected, repeat the test at appropriate intervals.   I-Stat CG4 Lactic Acid, ED     Status: Abnormal   Collection Time: 07/06/14  9:50 PM  Result Value Ref  Range   Lactic Acid, Venous 3.29 (H) 0.5 - 2.2 mmol/L  I-Stat Chem 8, ED     Status: Abnormal   Collection Time: 07/06/14  9:53 PM  Result Value Ref Range   Sodium 130 (L) 137 - 147 mEq/L   Potassium 3.6 (L) 3.7 - 5.3 mEq/L   Chloride 96 96 - 112 mEq/L   BUN 16 6 - 23 mg/dL   Creatinine, Ser 1.10 0.50 - 1.10 mg/dL   Glucose, Bld 152 (H) 70 - 99 mg/dL   Calcium, Ion 1.17 1.13 - 1.30 mmol/L   TCO2 18 0 - 100 mmol/L   Hemoglobin 17.0 (H) 12.0 - 15.0 g/dL   HCT 50.0 (H) 36.0 - 46.0 %  Type and screen for Red Blood Exchange     Status: None   Collection Time: 07/06/14 10:24 PM  Result Value Ref Range   ABO/RH(D) B POS    Antibody Screen NEG    Sample Expiration 07/09/2014   Blood Culture (routine x 2)     Status: None (Preliminary result)   Collection Time: 07/06/14 10:24 PM  Result Value Ref Range   Specimen Description BLOOD LEFT HAND    Special Requests BOTTLES DRAWN AEROBIC ONLY 3CC    Culture  Setup Time      07/07/2014 10:18 Performed at [place] NO GROWTH TO DATE CULTURE WILL BE HELD FOR 5 DAYS BEFORE ISSUING A FINAL NEGATIVE REPORT Performed at [facility]    Report Status PENDING   Blood Culture (routine x 2)     Status: None (Preliminary result)   Collection Time: 07/06/14 10:31 PM  Result Value Ref Range   Specimen Description BLOOD RIGHT ANTECUBITAL    Special Requests BOTTLES DRAWN AEROBIC AND ANAEROBIC 5CC EA    Culture  Setup Time      07/07/2014 10:18 Performed at [place] NO GROWTH TO DATE CULTURE WILL BE HELD FOR 5 DAYS BEFORE ISSUING A FINAL NEGATIVE REPORT Performed at [facility]    Report Status PENDING   TSH     Status: None   Collection Time: 07/06/14 11:15 PM  Result Value Ref Range   TSH 1.760 0.350 - 4.500 uIU/mL  Clostridium Difficile by PCR     Status: None   Collection Time: 07/07/14 12:52 AM  Result Value Ref Range   C difficile by pcr NEGATIVE NEGATIVE  Urine Drug Screen     Status: Abnormal   Collection Time: 07/07/14 12:53 AM  Result Value Ref Range   Opiates NONE DETECTED NONE DETECTED   Cocaine NONE DETECTED NONE DETECTED   Benzodiazepines POSITIVE (A) NONE DETECTED   Amphetamines POSITIVE (A) NONE DETECTED   Tetrahydrocannabinol NONE DETECTED NONE DETECTED   Barbiturates NONE DETECTED NONE DETECTED    Comment:        DRUG SCREEN FOR MEDICAL PURPOSES ONLY.  IF CONFIRMATION IS NEEDED FOR ANY PURPOSE, NOTIFY LAB WITHIN 5 DAYS.        LOWEST DETECTABLE LIMITS FOR URINE DRUG SCREEN Drug Class       Cutoff (ng/mL) Amphetamine      1000 Barbiturate      200 Benzodiazepine   330 Tricyclics       076 Opiates  300 Cocaine          300 THC              50   Urinalysis, Routine w reflex microscopic     Status: None   Collection Time: 07/07/14 12:53 AM  Result Value Ref Range   Color, Urine YELLOW YELLOW   APPearance CLEAR CLEAR   Specific Gravity, Urine 1.014 1.005 - 1.030   pH 6.0 5.0 - 8.0    Glucose, UA NEGATIVE NEGATIVE mg/dL   Hgb urine dipstick NEGATIVE NEGATIVE   Bilirubin Urine NEGATIVE NEGATIVE   Ketones, ur NEGATIVE NEGATIVE mg/dL   Protein, ur NEGATIVE NEGATIVE mg/dL   Urobilinogen, UA 1.0 0.0 - 1.0 mg/dL   Nitrite NEGATIVE NEGATIVE   Leukocytes, UA NEGATIVE NEGATIVE    Comment: MICROSCOPIC NOT DONE ON URINES WITH NEGATIVE PROTEIN, BLOOD, LEUKOCYTES, NITRITE, OR GLUCOSE <1000 mg/dL.  Urine culture     Status: None   Collection Time: 07/07/14 12:53 AM  Result Value Ref Range   Specimen Description URINE, CATHETERIZED    Special Requests URINE, RANDOM    Culture  Setup Time      07/07/2014 11:15 Performed at [place] Performed at [facility]     Culture NO GROWTH Performed at [facility]     Report Status 07/08/2014 FINAL   Lipase, blood     Status: None   Collection Time: 07/07/14  1:00 AM  Result Value Ref Range   Lipase 41 11 - 59 U/L  Comprehensive metabolic panel     Status: Abnormal   Collection Time: 07/07/14  1:00 AM  Result Value Ref Range   Sodium 131 (L) 137 - 147 mEq/L   Potassium 4.4 3.7 - 5.3 mEq/L   Chloride 95 (L) 96 - 112 mEq/L   CO2 20 19 - 32 mEq/L   Glucose, Bld 174 (H) 70 - 99 mg/dL   BUN 19 6 - 23 mg/dL   Creatinine, Ser 0.91 0.50 - 1.10 mg/dL   Calcium 8.4 8.4 - 10.5 mg/dL   Total Protein 5.9 (L) 6.0 - 8.3 g/dL   Albumin 3.0 (L) 3.5 - 5.2 g/dL   AST 25 0 - 37 U/L   ALT 11 0 - 35 U/L   Alkaline Phosphatase 107 39 - 117 U/L   Total Bilirubin 0.9 0.3 - 1.2 mg/dL   GFR calc non Af Amer 62 (L) >90 mL/min   GFR calc Af Amer 72 (L) >90 mL/min    Comment: (NOTE) The eGFR has been calculated using the CKD EPI equation. This calculation has not been validated in all clinical situations. eGFR's persistently <90 mL/min signify possible Chronic Kidney Disease.    Anion gap 16 (H) 5 - 15  Lactic acid, plasma     Status: None   Collection Time: 07/07/14  1:00 AM  Result Value  Ref Range   Lactic Acid, Venous 2.1 0.5 - 2.2 mmol/L  Troponin I (q 6hr x 3)     Status: None   Collection Time: 07/07/14  1:00 AM  Result Value Ref Range   Troponin I <0.30 <0.30 ng/mL    Comment:        Due to the release kinetics of cTnI, a negative result within the first hours of the onset of symptoms does not rule out myocardial infarction with certainty. If myocardial infarction is still suspected, repeat the test at appropriate intervals.   Glucose, capillary  Status: Abnormal   Collection Time: 07/07/14  2:22 AM  Result Value Ref Range   Glucose-Capillary 168 (H) 70 - 99 mg/dL  MRSA PCR Screening     Status: Abnormal   Collection Time: 07/07/14  3:43 AM  Result Value Ref Range   MRSA by PCR POSITIVE (A) NEGATIVE    Comment:        The GeneXpert MRSA Assay (FDA approved for NASAL specimens only), is one component of a comprehensive MRSA colonization surveillance program. It is not intended to diagnose MRSA infection nor to guide or monitor treatment for MRSA infections. RESULT CALLED TO, READ BACK BY AND VERIFIED WITH: [facility] RN [phone] 07/07/14 MITCHELL,L   CBC     Status: None   Collection Time: 07/07/14  4:15 AM  Result Value Ref Range   WBC 7.1 4.0 - 10.5 K/uL   RBC 4.61 3.87 - 5.11 MIL/uL   Hemoglobin 13.7 12.0 - 15.0 g/dL    Comment: REPEATED TO VERIFY   HCT 41.2 36.0 - 46.0 %   MCV 89.4 78.0 - 100.0 fL   MCH 29.7 26.0 - 34.0 pg   MCHC 33.3 30.0 - 36.0 g/dL   RDW 13.8 11.5 - 15.5 %   Platelets 242 150 - 400 K/uL  Lactic acid, plasma     Status: None   Collection Time: 07/07/14  4:15 AM  Result Value Ref Range   Lactic Acid, Venous 2.1 0.5 - 2.2 mmol/L  Glucose, capillary     Status: Abnormal   Collection Time: 07/07/14  4:19 AM  Result Value Ref Range   Glucose-Capillary 131 (H) 70 - 99 mg/dL   Comment 1 Notify RN   Troponin I (q 6hr x 3)     Status: None   Collection Time: 07/07/14  6:12 AM  Result Value Ref Range   Troponin I <0.30 <0.30  ng/mL    Comment:        Due to the release kinetics of cTnI, a negative result within the first hours of the onset of symptoms does not rule out myocardial infarction with certainty. If myocardial infarction is still suspected, repeat the test at appropriate intervals.   Glucose, capillary     Status: Abnormal   Collection Time: 07/07/14  7:22 AM  Result Value Ref Range   Glucose-Capillary 116 (H) 70 - 99 mg/dL  Lactic acid, plasma     Status: None   Collection Time: 07/07/14 10:00 AM  Result Value Ref Range   Lactic Acid, Venous 1.6 0.5 - 2.2 mmol/L  Glucose, capillary     Status: Abnormal   Collection Time: 07/07/14 11:52 AM  Result Value Ref Range   Glucose-Capillary 142 (H) 70 - 99 mg/dL  Stool culture     Status: None (Preliminary result)   Collection Time: 07/07/14 12:00 PM  Result Value Ref Range   Specimen Description STOOL    Special Requests NONE    Culture      Culture reincubated for better growth Performed at [facility]    Report Status PENDING   Clostridium Difficile by PCR     Status: None   Collection Time: 07/07/14 12:57 PM  Result Value Ref Range   C difficile by pcr NEGATIVE NEGATIVE  Glucose, capillary     Status: Abnormal   Collection Time: 07/07/14  3:40 PM  Result Value Ref Range   Glucose-Capillary 153 (H) 70 - 99 mg/dL  Glucose, capillary     Status: Abnormal  Collection Time: 07/07/14  5:44 PM  Result Value Ref Range   Glucose-Capillary 140 (H) 70 - 99 mg/dL  Glucose, capillary     Status: Abnormal   Collection Time: 07/07/14  9:43 PM  Result Value Ref Range   Glucose-Capillary 127 (H) 70 - 99 mg/dL   Comment 1 Documented in Chart    Comment 2 Notify RN   Glucose, capillary     Status: Abnormal   Collection Time: 07/07/14 11:06 PM  Result Value Ref Range   Glucose-Capillary 156 (H) 70 - 99 mg/dL  Glucose, capillary     Status: Abnormal   Collection Time: 07/08/14  3:02 AM  Result Value Ref Range   Glucose-Capillary 143 (H)  70 - 99 mg/dL  Glucose, capillary     Status: Abnormal   Collection Time: 07/08/14  7:52 AM  Result Value Ref Range   Glucose-Capillary 148 (H) 70 - 99 mg/dL  Comprehensive metabolic panel     Status: Abnormal   Collection Time: 07/08/14  8:15 AM  Result Value Ref Range   Sodium 137 137 - 147 mEq/L   Potassium 3.1 (L) 3.7 - 5.3 mEq/L   Chloride 103 96 - 112 mEq/L   CO2 22 19 - 32 mEq/L   Glucose, Bld 134 (H) 70 - 99 mg/dL   BUN 17 6 - 23 mg/dL   Creatinine, Ser 0.76 0.50 - 1.10 mg/dL   Calcium 8.8 8.4 - 10.5 mg/dL   Total Protein 5.6 (L) 6.0 - 8.3 g/dL   Albumin 2.5 (L) 3.5 - 5.2 g/dL   AST 14 0 - 37 U/L   ALT 8 0 - 35 U/L   Alkaline Phosphatase 69 39 - 117 U/L   Total Bilirubin 0.5 0.3 - 1.2 mg/dL   GFR calc non Af Amer 83 (L) >90 mL/min   GFR calc Af Amer >90 >90 mL/min    Comment: (NOTE) The eGFR has been calculated using the CKD EPI equation. This calculation has not been validated in all clinical situations. eGFR's persistently <90 mL/min signify possible Chronic Kidney Disease.    Anion gap 12 5 - 15  CBC with Differential     Status: Abnormal   Collection Time: 07/08/14  8:15 AM  Result Value Ref Range   WBC 6.8 4.0 - 10.5 K/uL   RBC 4.08 3.87 - 5.11 MIL/uL   Hemoglobin 11.8 (L) 12.0 - 15.0 g/dL   HCT 35.3 (L) 36.0 - 46.0 %   MCV 86.5 78.0 - 100.0 fL   MCH 28.9 26.0 - 34.0 pg   MCHC 33.4 30.0 - 36.0 g/dL   RDW 14.0 11.5 - 15.5 %   Platelets 263 150 - 400 K/uL   Neutrophils Relative % 76 43 - 77 %   Neutro Abs 5.1 1.7 - 7.7 K/uL   Lymphocytes Relative 12 12 - 46 %   Lymphs Abs 0.8 0.7 - 4.0 K/uL   Monocytes Relative 12 3 - 12 %   Monocytes Absolute 0.8 0.1 - 1.0 K/uL   Eosinophils Relative 0 0 - 5 %   Eosinophils Absolute 0.0 0.0 - 0.7 K/uL   Basophils Relative 0 0 - 1 %   Basophils Absolute 0.0 0.0 - 0.1 K/uL  Glucose, capillary     Status: Abnormal   Collection Time: 07/08/14 12:03 PM  Result Value Ref Range   Glucose-Capillary 160 (H) 70 - 99 mg/dL    Ct Abdomen Pelvis Wo Contrast  07/07/2014   CLINICAL DATA:  Nausea  and vomiting. Lower abdominal pain. History of small bowel obstructions.  EXAM: CT ABDOMEN AND PELVIS WITHOUT CONTRAST  TECHNIQUE: Multidetector CT imaging of the abdomen and pelvis was performed following the standard protocol without IV contrast.  COMPARISON:  01/30/2014  FINDINGS: Atelectasis in the lung bases. Coronary artery calcifications. There appears to be an enteric tube with tip in the distal esophagus. Suggest advancement of the stomach for better positioning.  The unenhanced appearance of the liver, spleen, pancreas, kidneys, inferior vena cava, and retroperitoneal lymph nodes is unremarkable. Calcification of the abdominal aorta without aneurysm. Right adrenal gland nodule measures 2.8 cm diameter. Density measurements consistent with fat. 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 No change since prior study. Surgical absence of the gallbladder. Mild bile duct dilatation is likely physiologic. Stomach appears normal. Old thickening in the small bowel suggesting enteritis. No evidence of small bowel dilatation. Colon is diffusely stool-filled with evidence of wall thickening in the transverse and descending portions. Mild pericolonic infiltration. This suggests colitis, likely of infectious or inflammatory etiology. No free air or free fluid in the abdomen.  Pelvis: Foley catheter in the bladder. Thickening of the wall of the rectosigmoid colon with pericolonic infiltration consistent with proctitis and colitis. There appears to be a balloon catheter in the rectum. Small amount of free fluid in the pelvis is probably reactive. Appendix is not identified. No pelvic mass or lymphadenopathy. Degenerative changes in the lumbar spine. No destructive bone lesions appreciated.  IMPRESSION: Wall thickening in the transverse, descending, and rectosigmoid colon consistent with nonspecific colitis, likely infectious or inflammatory. Small bowel fold  thickening suggest enteritis. No evidence of small bowel obstruction. Enteric tube tip is in the distal esophagus and should be advanced if placement in the stomach is desired. Foley and rectal catheters are present.   Electronically Signed   By: Lucienne Capers M.D.   On: 07/07/2014 03:29   Ct Angio Head W/cm &/or Wo Cm  07/08/2014   CLINICAL DATA:  Code stroke with LEFT brain TIA, 07/06/2014, now resolved. Slight headache.  EXAM: CT ANGIOGRAPHY HEAD  TECHNIQUE: Multidetector CT imaging of the head was performed using the standard protocol during bolus administration of intravenous contrast. Multiplanar CT image reconstructions and MIPs were obtained to evaluate the vascular anatomy.  CONTRAST:  31m OMNIPAQUE IOHEXOL 350 MG/ML SOLN  COMPARISON:  CT head performed 07/06/2014.  FINDINGS: The RIGHT internal carotid artery displays wide patency in its upper cervical and segment. There is a 50% inferior cavernous stenosis which is calcific. Mild supraclinoid calcification also estimated 50% stenosis.  On the LEFT, there is a 50% distal cervical calcific ICA stenosis. There is also mild non stenotic cavernous and supraclinoid atherosclerotic calcification.  The basilar is widely patent with both vertebrals contributing, LEFT slightly larger. Minimal distal LEFT vertebral calcification, non stenotic.  There is no proximal stenosis of the anterior, middle, or posterior cerebral arteries no MCA or PCA branch occlusion. No cerebellar branch occlusion. Generalized atrophy with hypoattenuation white matter representing small vessel disease is redemonstrated. No abnormal postcontrast enhancement. Major dural venous sinuses are patent.  Review of the MIP images confirms the above findings.  IMPRESSION: No flow reducing lesion of the intracranial circulation is evident.   Electronically Signed   By: JRolla FlattenM.D.   On: 07/08/2014 15:27   Dg Abd 1 View  07/08/2014   CLINICAL DATA:  Abdominal pain and vomiting.  Evaluate nasogastric tube. Colitis.  EXAM: ABDOMEN - 1 VIEW  COMPARISON:  07/07/2014  FINDINGS: Nasogastric  tube in the upper stomach region. There is oral contrast in the right colon. Right colon is dilated but similar to the recent CT. Few dilated loops of small bowel. Evidence for a rectal catheter. Mild scoliosis in lumbar spine with multilevel disc disease.  IMPRESSION: Nasogastric tube in the upper stomach region.  The oral contrast has advanced into the right colon. There continues to be dilatation of the right colon. Dilatation is probably secondary to the wall thickening in the left colon and colitis. Findings could represent at least a partial colonic obstruction.   Electronically Signed   By: Markus Daft M.D.   On: 07/08/2014 14:39   Ct Head Wo Contrast  07/06/2014   CLINICAL DATA:  Found down, last seen normal at 2030 hr, altered mental status, LEFT-sided weakness and slurred speech.  EXAM: CT HEAD WITHOUT CONTRAST  TECHNIQUE: Contiguous axial images were obtained from the base of the skull through the vertex without intravenous contrast.  COMPARISON:  CT of the head March 01, 2010  FINDINGS: Mild motion degraded examination.  The ventricles and sulci are normal for age. No intraparenchymal hemorrhage, mass effect nor midline shift. Patchy supratentorial and pontine white matter hypodensities are within normal range for patient's age and though non-specific suggest sequelae of chronic small vessel ischemic disease. No acute large vascular territory infarcts.  No abnormal extra-axial fluid collections. Basal cisterns are patent. Moderate calcific atherosclerosis of the carotid siphons and included vertebra arteries.  No skull fracture. The included ocular globes and orbital contents are non-suspicious. Status post bilateral ocular lens implants. RIGHT sphenoid mucosal retention cyst without paranasal sinus air-fluid levels. Patient is edentulous. Soft tissue within the external auditory canals most  consistent with cerumen. 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  IMPRESSION: No acute intracranial process on this mildly motion degraded examination.  Moderate to severe white matter changes suggest chronic small vessel ischemic disease. If clinical concern persists for acute ischemia, MRI of the brain with diffusion-weighted sequences would be more sensitive.  Acute findings discussed with and reconfirmed by Dr.Kirkpatrick on 07/06/2014 at 9:55 pm.   Electronically Signed   By: Elon Alas   On: 07/06/2014 22:05   Dg Chest Portable 1 View  07/07/2014   CLINICAL DATA:  Central line placement.  EXAM: PORTABLE CHEST - 1 VIEW  COMPARISON:  Chest radiograph July 06, 2014  FINDINGS: Interval placement of LEFT subclavian central venous catheter with distal tip projecting in mid superior vena cava. No pneumothorax.  The cardiac silhouette is upper limits of normal in size. Mediastinal silhouette is nonsuspicious, mildly calcified aortic knob. Strandy density persists in LEFT lung base. No pleural effusion. RIGHT humeral arthroplasty. Severe degenerative change of the LEFT shoulder, LEFT distal clavicle osteotomy.  IMPRESSION: Interval placement of LEFT subclavian central venous catheter, distal tip projecting in mid superior vena cava. No pneumothorax.  Stable cardiomegaly and LEFT lung base atelectasis.   Electronically Signed   By: Elon Alas   On: 07/07/2014 01:17   Dg Chest Port 1 View  07/06/2014   CLINICAL DATA:  Hypotension, history of COPD, hypertension, shortness of breath and pneumonia.  EXAM: PORTABLE CHEST - 1 VIEW  COMPARISON:  Chest radiograph January 30, 2014  FINDINGS: Cardiac silhouette is upper limits of normal in size. Tortuous, mildly calcified aorta. Strandy densities LEFT lung base. No pleural effusion or focal consolidation. No pneumothorax. Edit  Coil densities in the neck are unchanged, and may be vascular. Status post RIGHT humeral arthroplasty. Bilateral distal  clavicle resection,  severe degenerative change LEFT shoulder.  IMPRESSION: Borderline cardiomegaly. LEFT lung base atelectasis versus scarring.   Electronically Signed   By: Elon Alas   On: 07/06/2014 22:51   Dg Abd Portable 1v  07/07/2014   CLINICAL DATA:  Abdominal pain.  NG tube placement.  EXAM: PORTABLE ABDOMEN - 1 VIEW  COMPARISON:  01/31/2014  FINDINGS: The enteric tube tip projects over the EG junction region with proximal side hole projected over the lower esophagus. Appearance consistent with placement in the distal esophagus. Residual contrast material in the stomach and small bowel.  IMPRESSION: Enteric tube tip projects over the distal esophagus. Advancement to the stomach is recommended for better placement.   Electronically Signed   By: Lucienne Capers M.D.   On: 07/07/2014 02:52       Assessment/Plan 1. Colitis 2. Syncopal episode 3. MMP  Plan: 1. Clinically the patient does not have a large bowel obstruction as she has output in her flexiseal.  She likely has some colitis as noted on her CT scan.  There is not acute need for surgical intervention.  She likely needs a GI evaluation for further management of her colitis.  Agree with abx therapy per primary service.  We can repeat a film in the morning to make sure her contrast is continuing to move through her colon.  Gailyn Crook E 07/08/2014, 4:04 PM Pager: [phone]

## 2014-07-08 NOTE — Progress Notes (Signed)
INITIAL NUTRITION ASSESSMENT  DOCUMENTATION CODES Per approved criteria  -Obesity Unspecified   INTERVENTION: - Diet advancement as medically tolerated.  - Supplement as appropriate.   NUTRITION DIAGNOSIS: Inadequate oral intake related to inability to eat as evidenced by NPO and clear liquid diet.   Goal: Pt to meet >/= 90% of their estimated nutrition needs   Monitor:  Weight trend, diet advancement, labs  Reason for Assessment: Malnutrition Screening Tool, Low Braden Score  73 y.o. female  Admitting Dx: <principal problem not specified>  ASSESSMENT: 73 y/o woman with history of SBO in July, peptic ulcer disease and GERD, who presents from her assisted living facility after syncopal event subsequent to vomiting and abdominal pain.   - Pt reports that she is tolerating clear liquids. Reports that she drank 100% of her juice today. No recent weight loss. Pt currently has NG tube that is clamped and rectal tube in place.  - No signs of fat or muscle depletion.   Labs: Na WNL K low BUN WNL Albumin low  Height: Ht Readings from Last 1 Encounters:  07/07/14 4\' 11"  (1.499 m)    Weight: Wt Readings from Last 1 Encounters:  07/08/14 166 lb 7.2 oz (75.5 kg)    Ideal Body Weight: 43.2 kg  % Ideal Body Weight: 175%  Wt Readings from Last 10 Encounters:  07/08/14 166 lb 7.2 oz (75.5 kg)  01/30/14 144 lb 0.3 oz (65.325 kg)  10/03/13 171 lb 1.2 oz (77.6 kg)  09/28/13 160 lb 1.6 oz (72.621 kg)  02/16/08 188 lb 4 oz (85.39 kg)    Usual Body Weight: 160-170 lbs  % Usual Body Weight: 100%  BMI:  Body mass index is 33.6 kg/(m^2).  Estimated Nutritional Needs: Kcal: 1300-1500 Protein: 85-100 g Fluid: 1.5 L/day  Skin: Intact  Diet Order: Diet clear liquid  EDUCATION NEEDS: -No education needs identified at this time   Intake/Output Summary (Last 24 hours) at 07/08/14 1402 Last data filed at 07/08/14 0844  Gross per 24 hour  Intake      0 ml  Output     500 ml  Net   -500 ml    Last BM: 12/14 via rectal tube  Labs:   Recent Labs Lab 07/06/14 2143 07/06/14 2153 07/07/14 0100 07/08/14 0815  NA 129* 130* 131* 137  K 3.7 3.6* 4.4 3.1*  CL 90* 96 95* 103  CO2 19  --  20 22  BUN 15 16 19 17   CREATININE 0.84 1.10 0.91 0.76  CALCIUM 10.3  --  8.4 8.8  GLUCOSE 150* 152* 174* 134*    CBG (last 3)   Recent Labs  07/08/14 0302 07/08/14 0752 07/08/14 1203  GLUCAP 143* 148* 160*    Scheduled Meds: . antiseptic oral rinse  7 mL Mouth Rinse BID  . aspirin  81 mg Oral Daily  . ceFEPime (MAXIPIME) IV  2 g Intravenous Q12H  . Chlorhexidine Gluconate Cloth  6 each Topical Q0600  . DULoxetine  60 mg Oral Daily  . famotidine (PEPCID) IV  20 mg Intravenous Q24H  . heparin  5,000 Units Subcutaneous 3 times per day  . insulin aspart  0-9 Units Subcutaneous 6 times per day  . labetalol  100 mg Oral BID  . metronidazole  500 mg Intravenous Q8H  . mupirocin ointment  1 application Nasal BID  . oxybutynin  10 mg Oral Daily  . potassium chloride  10 mEq Intravenous Q1 Hr x 4    Continuous  Infusions:   Past Medical History  Diagnosis Date  . GERD (gastroesophageal reflux disease)     takes Protonix daily  . Urinary frequency     takes Ditropan daily  . Depression     takes Cymbalta daily  . Anxiety     takes Valium daily  . COPD (chronic obstructive pulmonary disease)     uses ALbuterol prn daily  . Hypertension     takes Amlodipine,Benazepril,and Labetalol daily  . Shortness of breath     with exertion  . History of bronchitis     last time in 1976  . Pneumonia     hx of-many yrs ago  . Headache(784.0)     occasionally  . Numbness     both feet  . Arthritis   . Joint pain   . Peripheral edema     but not on any meds  . Chronic back pain     herniated disc and stenosis  . Carpal tunnel syndrome   . H/O hiatal hernia   . History of gastric ulcer   . Urinary urgency   . History of kidney stones   . Asthma   .  COPD (chronic obstructive pulmonary disease)   . Bowel obstruction 07/08/2014    Past Surgical History  Procedure Laterality Date  . Cholecystectomy    . Abdominal hysterectomy    . Bladder tacked    . Shoulder surgery Bilateral   . Joint replacement Bilateral     knee   . Cataract surgery Bilateral   . Epidural block injection    . Esophagogastroduodenoscopy    . Lumbar laminectomy/decompression microdiscectomy N/A 10/02/2013    Procedure: LUMBAR ONE-TWO LAMINECTOMY/DECOMPRESSION MICRODISCECTOMY ;  Surgeon: Barnett AbuHenry Elsner, MD;  Location: [facility];  Service: Neurosurgery;  Laterality: N/A;  L1-2 Laminectomy  . Eye surgery      cataracts  . Spine surgery      Emmaline KluverHaley Alphia Behanna MS, RD, LDN

## 2014-07-08 NOTE — Progress Notes (Signed)
PROGRESS NOTE  Veronica Key:[id number] DOB: [birth year] DOA: 07/06/2014 PCP: [name], PA-C  73 yo female with pmh that includes COPD, SBO, gastric ulcer reports her illness came on suddenly.  She began having simultaneous vomiting and diarrhea and subsequently passed out (she does not remember the syncopal episode).  She was found with facial droop and brought in as a code stroke.  The code stroke was cancelled when her CT head showed no acute changes.  She denies feeling poorly recently and states that she knows of no sick contacts.  She was admitting to the ICU and required pressors.  CT Abdomen shows  thickening in the transverse, descending and rectosigmoid colon.  The patient has never had a colonoscopy.  Assessment/Plan:  Distributive Shock Resolved.  Patient's BP was 62/49 on admission. Hypotensive, Hypovolemic and infectious (uncertain etiology). U/a negative.  Blood and urine cultures are still pending.  Will stop the IV vanc at this point as her illness appear to be primarily abdominal.  Will continue flagyl and cefepime until we have more results back. Admitted to the ICU.  Rquired norepinephrine. Continue with IV fluids.   Colitis Evidenced on CT.  Appears to be infectious on CT.  Low grade temp (Tmax 100).  Possibly ischemic due to low flow state. C-diff has been ruled out - oral vanc discontinued. Continue flagyl, cefepime.  Stool culture is pending.   Rectal bag in place.  When diarrhea tapers will d/c flexiseal ASAP. Patient reports she has never had a colonoscopy because she is afraid to do so.   Syncope.  Rule out stroke/TIA CT head negative.     2D echo pending.  Carotid Dopplers pending Has been seen by stroke team.  Inferior ST segment elevation on EKG w/o reciprocal changes. Resolved on subsequent EKG.  2D echo is pending. Troponin cycled and are negative. Patient without chest pain.  Vomiting / Diarrhea Uncertain etiology.  Presumed  infectious. N/G in place.  Will clamp it and try clears. Flexiseal in place.  Will d/c when diarrhea decreases.  Have requested RN to strictly track Is and Os and mark flexi pouch. If diarrhea does not improve quickly will consider GI consult  Hypokalemia Secondary to GI losses Replete with IV KCL Give mag. IV  Amphetamine Positive  On tox screen.    Hypertension On Benazepril and Labetalol at home.  These have been held due to hypotensive shock. Patient's BP and pulse rate are rising.  Will add back labetalol at decreased dose and continue to monitor.   Chronic pain - bilateral shoulders. On oxy at home.  Changed to fentanyl while patient has n/g in place. Resume oxy when N/g is removed and patient can take POs.    DVT Prophylaxis:  heparin  Code Status: DNR Family Communication: Patient alert and orientated. Disposition Plan: inpatient.  PT ordered   SIGNIFICANT EVENTS / STUDIES:  1. 07/06/14 - EMS --> ED after found down with facial droop  2. 07/06/14 - CT head shows chronic small vessel disease but no acute process - code stroke canceled 3. 07/06/14 - EKG with ST segment elevation in inferior leads - code STEMI canceled by cardiology 4. 07/06/14 - Norepi started for shock, CCM called for admission  LINES / TUBES: 1. PIVs 07/06/14 >> 2. Foley 07/06/14 >> 3. Left subclavian CVC 07/06/14 >> 4. N/G 5. Rectal   CULTURES: 1. 07/06/14 blood cx x2 >>pending 2. 07/07/14 urine cx >>pending 3. 07/07/14 c diff screen >>negative x  2  ANTIBIOTICS: 1. Zosyn (one dose) 07/06/14 2. Vancomycin 07/06/14 >>12/14 3. Cefepime 07/07/14 >> 4. Flagyl 07/07/14 >> 5. Oral vanc 12/12>>>12/13   Antibiotics: Anti-infectives    Start     Dose/Rate Route Frequency Ordered Stop   07/07/14 1000  vancomycin (VANCOCIN) 500 mg in sodium chloride 0.9 % 100 mL IVPB  Status:  Discontinued     500 mg100 mL/hr over 60 Minutes Intravenous Every 12 hours 07/07/14 0226 07/08/14 1006   07/07/14  0800  ceFEPIme (MAXIPIME) 2 g in dextrose 5 % 50 mL IVPB     2 g100 mL/hr over 30 Minutes Intravenous Every 12 hours 07/07/14 0226     07/07/14 0530  vancomycin (VANCOCIN) 50 mg/mL oral solution 500 mg  Status:  Discontinued     500 mg Per Tube 4 times per day 07/07/14 0508 07/07/14 2054   07/07/14 0000  metroNIDAZOLE (FLAGYL) IVPB 500 mg     500 mg100 mL/hr over 60 Minutes Intravenous Every 8 hours 07/06/14 2352     07/06/14 2245  vancomycin (VANCOCIN) 1,250 mg in sodium chloride 0.9 % 250 mL IVPB     1,250 mg166.7 mL/hr over 90 Minutes Intravenous  Once 07/06/14 2236 07/07/14 0116   07/06/14 2245  piperacillin-tazobactam (ZOSYN) IVPB 3.375 g     3.375 g100 mL/hr over 30 Minutes Intravenous  Once 07/06/14 2236 07/06/14 2332        HPI/Subjective: I don't know what happened.  I started going at both ends like I did when I had an ulcer.  Objective: Filed Vitals:   07/07/14 2204 07/08/14 0121 07/08/14 0500 07/08/14 0559  BP: 140/60   132/81  Pulse: 106   94  Temp: 99 F (37.2 C)   98.2 F (36.8 C)  TempSrc: Oral   Oral  Resp: 16   18  Height:      Weight:   75.5 kg (166 lb 7.2 oz)   SpO2: 97% 97%  97%    Intake/Output Summary (Last 24 hours) at 07/08/14 1055 Last data filed at 07/08/14 0844  Gross per 24 hour  Intake     50 ml  Output    875 ml  Net   -825 ml   Filed Weights   07/07/14 0300 07/08/14 0500  Weight: 73.3 kg (161 lb 9.6 oz) 75.5 kg (166 lb 7.2 oz)    Exam: General: obese, elderly female,  NAD, appears stated age.  Lying comfortably in bed.  HEENT:  PEr,  EOMI, Anicteic Sclera, MMM.  Neck: Supple, thick, no JVD, no masses  Cardiovascular: RRR, S1 S2 auscultated, no rubs, murmurs or gallops.   Respiratory: Clear to auscultation bilaterally with equal chest rise  Abdomen: Obese, tender to palpation particularly in the epigastrum and LLQ.  Soft, decreased bowel sounds.  Black "water" in rectal bag. Extremities: warm dry without cyanosis clubbing or edema.   Neuro: AAOx3, cranial nerves grossly intact. Able to move all extremities, speech is clear. Skin: redness across the left face.  No frank rash or bruising. Psych: Normal affect and demeanor with intact judgement and insight   Data Reviewed: Basic Metabolic Panel:  Recent Labs Lab 07/06/14 2143 07/06/14 2153 07/07/14 0100 07/08/14 0815  NA 129* 130* 131* 137  K 3.7 3.6* 4.4 3.1*  CL 90* 96 95* 103  CO2 19  --  20 22  GLUCOSE 150* 152* 174* 134*  BUN 15 16 19 17   CREATININE 0.84 1.10 0.91 0.76  CALCIUM 10.3  --  8.4 8.8   Liver Function Tests:  Recent Labs Lab 07/06/14 2143 07/07/14 0100 07/08/14 0815  AST 18 25 14   ALT 11 11 8   ALKPHOS 134* 107 69  BILITOT 0.7 0.9 0.5  PROT 7.4 5.9* 5.6*  ALBUMIN 3.9 3.0* 2.5*    Recent Labs Lab 07/07/14 0100  LIPASE 41   CBC:  Recent Labs Lab 07/06/14 2143 07/06/14 2153 07/07/14 0415 07/08/14 0815  WBC 14.8*  --  7.1 6.8  NEUTROABS 12.0*  --   --  5.1  HGB 15.0 17.0* 13.7 11.8*  HCT 43.9 50.0* 41.2 35.3*  MCV 86.9  --  89.4 86.5  PLT 280  --  242 263   Cardiac Enzymes:  Recent Labs Lab 07/06/14 2143 07/07/14 0100 07/07/14 0612  TROPONINI <0.30 <0.30 <0.30   CBG:  Recent Labs Lab 07/07/14 1744 07/07/14 2143 07/07/14 2306 07/08/14 0302 07/08/14 0752  GLUCAP 140* 127* 156* 143* 148*    Recent Results (from the past 240 hour(s))  Blood Culture (routine x 2)     Status: None (Preliminary result)   Collection Time: 07/06/14 10:24 PM  Result Value Ref Range Status   Specimen Description BLOOD LEFT HAND  Final   Special Requests BOTTLES DRAWN AEROBIC ONLY 3CC  Final   Culture  Setup Time   Final    07/07/2014 10:18 Performed at [facility]    Culture   Final           BLOOD CULTURE RECEIVED NO GROWTH TO DATE CULTURE WILL BE HELD FOR 5 DAYS BEFORE ISSUING A FINAL NEGATIVE REPORT Performed at [facility]    Report Status PENDING  Incomplete  Blood Culture (routine x 2)     Status:  None (Preliminary result)   Collection Time: 07/06/14 10:31 PM  Result Value Ref Range Status   Specimen Description BLOOD RIGHT ANTECUBITAL  Final   Special Requests BOTTLES DRAWN AEROBIC AND ANAEROBIC 5CC EA  Final   Culture  Setup Time   Final    07/07/2014 10:18 Performed at [facility]    Culture   Final           BLOOD CULTURE RECEIVED NO GROWTH TO DATE CULTURE WILL BE HELD FOR 5 DAYS BEFORE ISSUING A FINAL NEGATIVE REPORT Performed at [facility]    Report Status PENDING  Incomplete  Clostridium Difficile by PCR     Status: None   Collection Time: 07/07/14 12:52 AM  Result Value Ref Range Status   C difficile by pcr NEGATIVE NEGATIVE Final  MRSA PCR Screening     Status: Abnormal   Collection Time: 07/07/14  3:43 AM  Result Value Ref Range Status   MRSA by PCR POSITIVE (A) NEGATIVE Final    Comment:        The GeneXpert MRSA Assay (FDA approved for NASAL specimens only), is one component of a comprehensive MRSA colonization surveillance program. It is not intended to diagnose MRSA infection nor to guide or monitor treatment for MRSA infections. RESULT CALLED TO, READ BACK BY AND VERIFIED WITH: [facility] RN [id number] 07/07/14 MITCHELL,L   Stool culture     Status: None (Preliminary result)   Collection Time: 07/07/14 12:00 PM  Result Value Ref Range Status   Specimen Description STOOL  Final   Special Requests NONE  Final   Culture   Final    Culture reincubated for better growth Performed at [facility]    Report Status PENDING  Incomplete  Clostridium Difficile by PCR     Status: None   Collection Time: 07/07/14 12:57 PM  Result Value Ref Range Status   C difficile by pcr NEGATIVE NEGATIVE Final     Studies: Ct Abdomen Pelvis Wo Contrast  07/07/2014   CLINICAL DATA:  Nausea and vomiting. Lower abdominal pain. History of small bowel obstructions.  EXAM: CT ABDOMEN AND PELVIS WITHOUT CONTRAST  TECHNIQUE: Multidetector CT imaging of the  abdomen and pelvis was performed following the standard protocol without IV contrast.  COMPARISON:  01/30/2014  FINDINGS: Atelectasis in the lung bases. Coronary artery calcifications. There appears to be an enteric tube with tip in the distal esophagus. Suggest advancement of the stomach for better positioning.  The unenhanced appearance of the liver, spleen, pancreas, kidneys, inferior vena cava, and retroperitoneal lymph nodes is unremarkable. Calcification of the abdominal aorta without aneurysm. Right adrenal gland nodule measures 2.8 cm diameter. Density measurements consistent with fat. 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 No change since prior study. Surgical absence of the gallbladder. Mild bile duct dilatation is likely physiologic. Stomach appears normal. Old thickening in the small bowel suggesting enteritis. No evidence of small bowel dilatation. Colon is diffusely stool-filled with evidence of wall thickening in the transverse and descending portions. Mild pericolonic infiltration. This suggests colitis, likely of infectious or inflammatory etiology. No free air or free fluid in the abdomen.  Pelvis: Foley catheter in the bladder. Thickening of the wall of the rectosigmoid colon with pericolonic infiltration consistent with proctitis and colitis. There appears to be a balloon catheter in the rectum. Small amount of free fluid in the pelvis is probably reactive. Appendix is not identified. No pelvic mass or lymphadenopathy. Degenerative changes in the lumbar spine. No destructive bone lesions appreciated.  IMPRESSION: Wall thickening in the transverse, descending, and rectosigmoid colon consistent with nonspecific colitis, likely infectious or inflammatory. Small bowel fold thickening suggest enteritis. No evidence of small bowel obstruction. Enteric tube tip is in the distal esophagus and should be advanced if placement in the stomach is desired. Foley and rectal catheters are present.   Electronically Signed    By: Burman Nieves M.D.   On: 07/07/2014 03:29   Ct Head Wo Contrast  07/06/2014   CLINICAL DATA:  Found down, last seen normal at 2030 hr, altered mental status, LEFT-sided weakness and slurred speech.  EXAM: CT HEAD WITHOUT CONTRAST  TECHNIQUE: Contiguous axial images were obtained from the base of the skull through the vertex without intravenous contrast.  COMPARISON:  CT of the head March 01, 2010  FINDINGS: Mild motion degraded examination.  The ventricles and sulci are normal for age. No intraparenchymal hemorrhage, mass effect nor midline shift. Patchy supratentorial and pontine white matter hypodensities are within normal range for patient's age and though non-specific suggest sequelae of chronic small vessel ischemic disease. No acute large vascular territory infarcts.  No abnormal extra-axial fluid collections. Basal cisterns are patent. Moderate calcific atherosclerosis of the carotid siphons and included vertebra arteries.  No skull fracture. The included ocular globes and orbital contents are non-suspicious. Status post bilateral ocular lens implants. RIGHT sphenoid mucosal retention cyst without paranasal sinus air-fluid levels. Patient is edentulous. Soft tissue within the external auditory canals most consistent with cerumen. 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  IMPRESSION: No acute intracranial process on this mildly motion degraded examination.  Moderate to severe white matter changes suggest chronic small vessel ischemic disease. If clinical concern persists for acute ischemia, MRI of the brain with  diffusion-weighted sequences would be more sensitive.  Acute findings discussed with and reconfirmed by Dr.Kirkpatrick on 07/06/2014 at 9:55 pm.   Electronically Signed   By: Awilda Metro   On: 07/06/2014 22:05   Dg Chest Portable 1 View  07/07/2014   CLINICAL DATA:  Central line placement.  EXAM: PORTABLE CHEST - 1 VIEW  COMPARISON:  Chest radiograph July 06, 2014   FINDINGS: Interval placement of LEFT subclavian central venous catheter with distal tip projecting in mid superior vena cava. No pneumothorax.  The cardiac silhouette is upper limits of normal in size. Mediastinal silhouette is nonsuspicious, mildly calcified aortic knob. Strandy density persists in LEFT lung base. No pleural effusion. RIGHT humeral arthroplasty. Severe degenerative change of the LEFT shoulder, LEFT distal clavicle osteotomy.  IMPRESSION: Interval placement of LEFT subclavian central venous catheter, distal tip projecting in mid superior vena cava. No pneumothorax.  Stable cardiomegaly and LEFT lung base atelectasis.   Electronically Signed   By: Awilda Metro   On: 07/07/2014 01:17   Dg Chest Port 1 View  07/06/2014   CLINICAL DATA:  Hypotension, history of COPD, hypertension, shortness of breath and pneumonia.  EXAM: PORTABLE CHEST - 1 VIEW  COMPARISON:  Chest radiograph January 30, 2014  FINDINGS: Cardiac silhouette is upper limits of normal in size. Tortuous, mildly calcified aorta. Strandy densities LEFT lung base. No pleural effusion or focal consolidation. No pneumothorax. Edit  Coil densities in the neck are unchanged, and may be vascular. Status post RIGHT humeral arthroplasty. Bilateral distal clavicle resection, severe degenerative change LEFT shoulder.  IMPRESSION: Borderline cardiomegaly. LEFT lung base atelectasis versus scarring.   Electronically Signed   By: Awilda Metro   On: 07/06/2014 22:51   Dg Abd Portable 1v  07/07/2014   CLINICAL DATA:  Abdominal pain.  NG tube placement.  EXAM: PORTABLE ABDOMEN - 1 VIEW  COMPARISON:  01/31/2014  FINDINGS: The enteric tube tip projects over the EG junction region with proximal side hole projected over the lower esophagus. Appearance consistent with placement in the distal esophagus. Residual contrast material in the stomach and small bowel.  IMPRESSION: Enteric tube tip projects over the distal esophagus. Advancement to the  stomach is recommended for better placement.   Electronically Signed   By: Burman Nieves M.D.   On: 07/07/2014 02:52    Scheduled Meds: . antiseptic oral rinse  7 mL Mouth Rinse BID  . ceFEPime (MAXIPIME) IV  2 g Intravenous Q12H  . Chlorhexidine Gluconate Cloth  6 each Topical Q0600  . DULoxetine  60 mg Oral Daily  . famotidine (PEPCID) IV  20 mg Intravenous Q24H  . heparin  5,000 Units Subcutaneous 3 times per day  . insulin aspart  0-9 Units Subcutaneous 6 times per day  . ipratropium-albuterol  3 mL Nebulization Q6H  . labetalol  100 mg Oral BID  . LORazepam  1 mg Intravenous Once  . magnesium sulfate 1 - 4 g bolus IVPB  2 g Intravenous Once  . metronidazole  500 mg Intravenous Q8H  . mupirocin ointment  1 application Nasal BID  . oxybutynin  10 mg Oral Daily  . potassium chloride  10 mEq Intravenous Q1 Hr x 4   Continuous Infusions:   Active Problems:   Septic shock   Dysarthria   Abdominal pain   Central line insertion site infection   Hypotension    Conley Canal  [facility] Pager [phone]. If 7PM-7AM, please contact night-coverage at [email or web address], password [facility]  07/08/2014, 10:55 AM  LOS: 2 days   Patient seen and examined. Patient lethargic, just received ativan pre x ray. Agree with PN done by Algis DownsMarianne York.  -Sepsis resolving. . Continue with IV fluids.  -KUB with concern for partial colonic obstruction. Will check lactic acid. Fluid from NG tube greening material. Will consult surgery.   -VT; replete k, mg. Check EKG, cycle enzymes. She had EKG change on admission and syncope. Cardiology consulted.  -discussed with Dr Pearlean Browniesethi, patient probably wont be able to tolerate MRI. Will check Ct head angio.

## 2014-07-09 ENCOUNTER — Inpatient Hospital Stay (HOSPITAL_COMMUNITY): Payer: Medicare Other

## 2014-07-09 DIAGNOSIS — K560 Paralytic ileus: Secondary | ICD-10-CM

## 2014-07-09 DIAGNOSIS — K529 Noninfective gastroenteritis and colitis, unspecified: Secondary | ICD-10-CM

## 2014-07-09 DIAGNOSIS — I479 Paroxysmal tachycardia, unspecified: Secondary | ICD-10-CM

## 2014-07-09 DIAGNOSIS — I471 Supraventricular tachycardia: Secondary | ICD-10-CM | POA: Insufficient documentation

## 2014-07-09 DIAGNOSIS — I10 Essential (primary) hypertension: Secondary | ICD-10-CM | POA: Insufficient documentation

## 2014-07-09 DIAGNOSIS — K567 Ileus, unspecified: Secondary | ICD-10-CM

## 2014-07-09 LAB — GLUCOSE, CAPILLARY
Glucose-Capillary: 102 mg/dL — ABNORMAL HIGH (ref 70–99)
Glucose-Capillary: 105 mg/dL — ABNORMAL HIGH (ref 70–99)
Glucose-Capillary: 150 mg/dL — ABNORMAL HIGH (ref 70–99)
Glucose-Capillary: 92 mg/dL (ref 70–99)
Glucose-Capillary: 94 mg/dL (ref 70–99)
Glucose-Capillary: 95 mg/dL (ref 70–99)

## 2014-07-09 LAB — CBC
HCT: 31.5 % — ABNORMAL LOW (ref 36.0–46.0)
Hemoglobin: 10.4 g/dL — ABNORMAL LOW (ref 12.0–15.0)
MCH: 28.7 pg (ref 26.0–34.0)
MCHC: 33 g/dL (ref 30.0–36.0)
MCV: 86.8 fL (ref 78.0–100.0)
PLATELETS: 244 10*3/uL (ref 150–400)
RBC: 3.63 MIL/uL — AB (ref 3.87–5.11)
RDW: 13.9 % (ref 11.5–15.5)
WBC: 5.8 10*3/uL (ref 4.0–10.5)

## 2014-07-09 LAB — TROPONIN I: Troponin I: <0.3 ng/mL (ref <0.30)

## 2014-07-09 LAB — LIPID PANEL
CHOL/HDL RATIO: 2.8 ratio
CHOLESTEROL: 81 mg/dL (ref 0–200)
HDL: 29 mg/dL — AB (ref >39)
LDL Cholesterol: 32 mg/dL (ref 0–99)
Triglycerides: 99 mg/dL (ref <150)
VLDL: 20 mg/dL (ref 0–40)

## 2014-07-09 LAB — BASIC METABOLIC PANEL
ANION GAP: 10 (ref 5–15)
BUN: 8 mg/dL (ref 6–23)
CHLORIDE: 100 meq/L (ref 96–112)
CO2: 25 meq/L (ref 19–32)
Calcium: 8.8 mg/dL (ref 8.4–10.5)
Creatinine, Ser: 0.53 mg/dL (ref 0.50–1.10)
GFR calc non Af Amer: >90 mL/min (ref >90)
Glucose, Bld: 92 mg/dL (ref 70–99)
POTASSIUM: 3.1 meq/L — AB (ref 3.7–5.3)
Sodium: 135 mEq/L — ABNORMAL LOW (ref 137–147)

## 2014-07-09 MED ORDER — LABETALOL HCL 200 MG PO TABS
200.0000 mg | ORAL_TABLET | Freq: Two times a day (BID) | ORAL | Status: DC
  Administered 2014-07-09 – 2014-07-10 (×3): 200 mg via ORAL
  Filled 2014-07-09 (×3): qty 1

## 2014-07-09 MED ORDER — POTASSIUM CHLORIDE 10 MEQ/100ML IV SOLN
10.0000 meq | INTRAVENOUS | Status: AC
  Administered 2014-07-09 (×4): 10 meq via INTRAVENOUS
  Filled 2014-07-09 (×4): qty 100

## 2014-07-09 MED ORDER — POTASSIUM CHLORIDE 20 MEQ/15ML (10%) PO SOLN
40.0000 meq | Freq: Two times a day (BID) | ORAL | Status: DC
  Administered 2014-07-09: 40 meq via ORAL
  Filled 2014-07-09 (×2): qty 30

## 2014-07-09 MED ORDER — SODIUM CHLORIDE 0.9 % IV SOLN
INTRAVENOUS | Status: DC
  Administered 2014-07-09 – 2014-07-10 (×2): via INTRAVENOUS
  Filled 2014-07-09 (×5): qty 1000

## 2014-07-09 NOTE — Evaluation (Signed)
Physical Therapy Evaluation Patient Details Name: Veronica Key MRN: [id number] DOB: [birth year] Today's Date: 07/09/2014   History of Present Illness  73 yo female with pmh that includes COPD, SBO, gastric ulcer reports her illness came on suddenly.  She began having simultaneous vomiting and diarrhea and subsequently passed out (she does not remember the syncopal episode).  She was found with facial droop and brought in as a code stroke.  The code stroke was cancelled when her CT head showed no acute changes.  She denies feeling poorly recently and states that she knows of no sick contacts.  She was admitting to the ICU and required pressors.  CT Abdomen shows .  Working diagnosis of colitis  Clinical Impression  Pt admitted with/for vomiting and diarrhea found to be due to colitis.  Presently patient is deconditioned  Pt currently limited functionally due to the problems listed. ( See problems list.)   Pt will benefit from PT to maximize function and safety in order to get ready for next venue listed below.     Follow Up Recommendations SNF    Equipment Recommendations  Other (comment);None recommended by PT (TBA)    Recommendations for Other Services       Precautions / Restrictions Precautions Precautions: Fall Restrictions Weight Bearing Restrictions: No      Mobility  Bed Mobility Overal bed mobility: Needs Assistance Bed Mobility: Supine to Sit     Supine to sit: Min assist (use of rail, HOB mildly elevated)     General bed mobility comments: pt used rail, HOB elevated so mostly need assist scooting to EOB, due to not using hand with IV well.  Transfers Overall transfer level: Needs assistance Equipment used: Rolling walker (2 wheeled) Transfers: Sit to/from Stand Sit to Stand: Min assist         General transfer comment: Cues for hand placement; steady assist  Ambulation/Gait Ambulation/Gait assistance: Min assist Ambulation Distance (Feet): 14  Feet Assistive device: Rolling walker (2 wheeled) Gait Pattern/deviations: Step-through pattern;Decreased step length - right;Decreased step length - left Gait velocity: very slow Gait velocity interpretation: Below normal speed for age/gender General Gait Details: short shuffled steps.  R foot ER'd and pulled forward with adductors  Stairs            Wheelchair Mobility    Modified Rankin (Stroke Patients Only)       Balance Overall balance assessment: Needs assistance Sitting-balance support: Feet supported;No upper extremity supported Sitting balance-Leahy Scale: Fair     Standing balance support: Bilateral upper extremity supported Standing balance-Leahy Scale: Poor                               Pertinent Vitals/Pain Pain Assessment: Faces Faces Pain Scale: Hurts little more Pain Location: abdomin  Pain Descriptors / Indicators: Tender Pain Intervention(s): Monitored during session    Home Living Family/patient expects to be discharged to:: Unsure Living Arrangements: Alone             Home Equipment: Walker - 4 wheels;Bedside commode;Wheelchair - power      Prior Function Level of Independence: Needs assistance   Gait / Transfers Assistance Needed: pt uses 4wheeled RW  ADL's / Homemaking Assistance Needed: caregiver assists with bathing and dressing  Comments: Caregiver supposed to be around 5 hours/day 5-10     Hand Dominance   Dominant Hand: Right    Extremity/Trunk Assessment   Upper Extremity Assessment: Defer  to OT evaluation (generally weak bil)           Lower Extremity Assessment: Overall WFL for tasks assessed;Generalized weakness         Communication   Communication: No difficulties  Cognition Arousal/Alertness: Awake/alert Behavior During Therapy: WFL for tasks assessed/performed Overall Cognitive Status: Within Functional Limits for tasks assessed                      General Comments General  comments (skin integrity, edema, etc.): sats on RA remained in Mid 90's throughout activity with EHR in the 70's ( )    Exercises        Assessment/Plan    PT Assessment Patient needs continued PT services  PT Diagnosis Difficulty walking;Acute pain;Generalized weakness   PT Problem List Decreased strength;Decreased activity tolerance;Decreased range of motion;Decreased mobility;Decreased balance;Decreased knowledge of use of DME  PT Treatment Interventions DME instruction;Gait training;Functional mobility training;Therapeutic activities;Therapeutic exercise;Balance training;Patient/family education   PT Goals (Current goals can be found in the Care Plan section) Acute Rehab PT Goals Patient Stated Goal: Eventually back home PT Goal Formulation: With patient Time For Goal Achievement: 07/23/14 Potential to Achieve Goals: Good    Frequency Min 3X/week   Barriers to discharge  (no caregiver for most of the day.)      Co-evaluation               End of Session   Activity Tolerance: Patient tolerated treatment well Patient left: in chair;with chair alarm set;with call bell/phone within reach Nurse Communication: Mobility status         Time: 1152-1222 PT Time Calculation (min) (ACUTE ONLY): 30 min   Charges:   PT Evaluation $Initial PT Evaluation Tier I: 1 Procedure PT Treatments $Gait Training: 8-22 mins $Therapeutic Activity: 8-22 mins   PT G Codes:          Hye Trawick, Eliseo GumKenneth V 07/09/2014, 12:41 PM 07/09/2014  Sanford BingKen Nakhi Choi, PT [phone]  (pager)

## 2014-07-09 NOTE — Progress Notes (Signed)
STROKE TEAM PROGRESS NOTE   HISTORY  Veronica Key is a 73 y.o. female who was in her normal state of health around 8:30 PM and you have subsequently became lightheaded and fell. She states that she has been nauseated as well. She states that she did not pass out completely. EMS was called and en route she was noticed to have a facial droop and dysarthria. Therefore a code stroke was called. In the ED, she was found to be profoundly hypotensive.    LKW: 8:30 pm tpa given?: no, mild deficits.      SUBJECTIVE (INTERVAL HISTORY) Her family is not is at the bedside.  Overall she feels her condition is gradually improving.     OBJECTIVE Temp:  [97.7 F (36.5 C)-97.8 F (36.6 C)] 97.7 F (36.5 C) (12/15 0507) Pulse Rate:  [68-84] 68 (12/15 1534) Cardiac Rhythm:  [-] Normal sinus rhythm (12/15 0930) Resp:  [20-22] 20 (12/15 1534) BP: (156-168)/(59-85) 156/59 mmHg (12/15 1534) SpO2:  [99 %-100 %] 99 % (12/15 1534) Weight:  [167 lb 15.9 oz (76.2 kg)] 167 lb 15.9 oz (76.2 kg) (12/15 0507)   Recent Labs Lab 07/08/14 2006 07/09/14 0042 07/09/14 0314 07/09/14 0749 07/09/14 1132  GLUCAP 95 105* 102* 92 94    Recent Labs Lab 07/06/14 2143 07/06/14 2153 07/07/14 0100 07/08/14 0815 07/08/14 1855 07/09/14 0625  NA 129* 130* 131* 137  --  135*  K 3.7 3.6* 4.4 3.1*  --  3.1*  CL 90* 96 95* 103  --  100  CO2 19  --  20 22  --  25  GLUCOSE 150* 152* 174* 134*  --  92  BUN 15 16 19 17   --  8  CREATININE 0.84 1.10 0.91 0.76  --  0.53  CALCIUM 10.3  --  8.4 8.8  --  8.8  MG  --   --   --   --  2.1  --     Recent Labs Lab 07/06/14 2143 07/07/14 0100 07/08/14 0815  AST 18 25 14   ALT 11 11 8   ALKPHOS 134* 107 69  BILITOT 0.7 0.9 0.5  PROT 7.4 5.9* 5.6*  ALBUMIN 3.9 3.0* 2.5*    Recent Labs Lab 07/06/14 2143 07/06/14 2153 07/07/14 0415 07/08/14 0815 07/09/14 0625  WBC 14.8*  --  7.1 6.8 5.8  NEUTROABS 12.0*  --   --  5.1  --   HGB 15.0 17.0* 13.7 11.8* 10.4*  HCT  43.9 50.0* 41.2 35.3* 31.5*  MCV 86.9  --  89.4 86.5 86.8  PLT 280  --  242 263 244    Recent Labs Lab 07/06/14 2143 07/07/14 0100 07/07/14 0612 07/08/14 1855 07/09/14 0625  TROPONINI <0.30 <0.30 <0.30 <0.30 <0.30    Recent Labs  07/06/14 2143  LABPROT 15.5*  INR 1.22    Recent Labs  07/07/14 0053  COLORURINE YELLOW  LABSPEC 1.014  PHURINE 6.0  GLUCOSEU NEGATIVE  HGBUR NEGATIVE  BILIRUBINUR NEGATIVE  KETONESUR NEGATIVE  PROTEINUR NEGATIVE  UROBILINOGEN 1.0  NITRITE NEGATIVE  LEUKOCYTESUR NEGATIVE    No results found for: CHOL, TRIG, HDL, CHOLHDL, VLDL, LDLCALC Lab Results  Component Value Date   HGBA1C  10/14/2009    5.7 (NOTE) The ADA recommends the following therapeutic goal for glycemic control related to Hgb A1c measurement: Goal of therapy: <6.5 Hgb A1c  Reference: American Diabetes Association: Clinical Practice Recommendations 2010, Diabetes Care, 2010, 33: (Suppl  1).      Component Value  Date/Time   LABOPIA NONE DETECTED 07/07/2014 0053   COCAINSCRNUR NONE DETECTED 07/07/2014 0053   LABBENZ POSITIVE* 07/07/2014 0053   AMPHETMU POSITIVE* 07/07/2014 0053   THCU NONE DETECTED 07/07/2014 0053   LABBARB NONE DETECTED 07/07/2014 0053     Recent Labs Lab 07/06/14 2143  ETH <11    Ct Abdomen Pelvis Wo Contrast 07/07/2014    Wall thickening in the transverse, descending, and rectosigmoid colon consistent with nonspecific colitis, likely infectious or inflammatory. Small bowel fold thickening suggest enteritis. No evidence of small bowel obstruction. Enteric tube tip is in the distal esophagus and should be advanced if placement in the stomach is desired. Foley and rectal catheters are present.       Ct Head Wo Contrast 07/06/2014    No acute intracranial process on this mildly motion degraded examination.  Moderate to severe white matter changes suggest chronic small vessel ischemic disease. If clinical concern persists for acute ischemia, MRI of  the brain with diffusion-weighted sequences would be more sensitive.    Dg Chest Portable 1 View 07/07/2014    Interval placement of LEFT subclavian central venous catheter, distal tip projecting in mid superior vena cava. No pneumothorax.  Stable cardiomegaly and LEFT lung base atelectasis.      Dg Chest Port 1 View 07/06/2014    Borderline cardiomegaly. LEFT lung base atelectasis versus scarring.      Dg Abd Portable 1v 07/07/2014    Enteric tube tip projects over the distal esophagus. Advancement to the stomach is recommended for better placement.       PHYSICAL EXAM Frail elderly lady not in distress.Awake alert. Afebrile. Head is nontraumatic. Neck is supple without bruit.   l. Cardiac exam no murmur or gallop. Lungs are clear to auscultation. Distal pulses are well felt. Neurological Exam ;  Awake  Alert oriented x 2. Diminished attention, registration and recall. Follows two-step commands. Normal speech and language.eye movements full without nystagmus.fundi were not visualized. Vision acuity and fields appear normal. Hearing is normal. Palatal movements are normal. Face symmetric. Tongue midline. Normal strength, tone, reflexes and coordination. Normal sensation. Gait deferred. ASSESSMENT/PLAN [name] is a 73 y.o. female with history of anxiety, depression, hypertension, COPD, and ongoing tobacco use presenting with facial droop and dysarthria following lightheadedness and subsequent fall. . She did not receive IV t-PA due to mild deficits.  Left BrainTIA:  Non-dominant likely due small vessel disease  In setting of hypotension  Resultant  No deficits  MRI - not ordered  MRA - not ordered  Carotid Doppler - not ordered  2D Echo - pending  LDL - not ordered  HgbA1C - not ordered  Subcutaneous heparin for VTE prophylaxis  Diet clear liquid no liquids  No antithrombotics prior to admission, now on no antithrombotics  Ongoing aggressive stroke risk factor  management  Therapy recommendations:  Pending  Disposition: Pending  Hypertension  Home meds: Lotensin 20 mg twice a day  Stable   Hyperlipidemia  Home meds: No statins prior to admission  LDL pending, goal < 70  Add statin if indicated  Continue statin at discharge  Diabetes  HgbA1c pending goal < 7.0  Controlled  Other Stroke Risk Factors  Advanced age  Cigarette smoker, advised to stop smoking  Obesity, Body mass index is 33.91 kg/(m^2).    Other Active Problems  UDS - benzodiazepines and amphetamines  Hypotensive on admission  Hyponatremia - sodium 131  Abdominal pain with nausea and vomiting  Other Pertinent  History  Ongoing tobacco use  Hospital day # 3 I have personally examined this patient, reviewed notes, independently viewed imaging studies, participated in medical decision making and plan of care. I have made any additions or clarifications directly to the above note. Agree with note above. Patient had transient slurred speech and right-sided weakness in the setting of hypotension and the etiology of which is pending investigated. We'll obtain MRI when the patient is hemodynamically stable  Delia HeadyPramod Hosteen Kienast, MD Medical Director [facility] Pager: [phone] 07/09/2014 4:35 PM     To contact Stroke Continuity provider, please refer to [email or web address]. After hours, contact General Neurology

## 2014-07-09 NOTE — Progress Notes (Signed)
STROKE TEAM PROGRESS NOTE   HISTORY  Veronica Key is a 73 y.o. female who was in her normal state of health around 8:30 PM and you have subsequently became lightheaded and fell. She states that she has been nauseated as well. She states that she did not pass out completely. EMS was called and en route she was noticed to have a facial droop and dysarthria. Therefore a code stroke was called. In the ED, she was found to be profoundly hypotensive.    LKW: 8:30 pm tpa given?: no, mild deficits.      SUBJECTIVE (INTERVAL HISTORY) Her family is not is at the bedside.  Overall she feels her condition is gradually improving.  Patient stated that she is claustrophobic and she is not comfortable lying flat for an MRI because of chronic back pain and would prefer not to have it done   OBJECTIVE Temp:  [97.7 F (36.5 C)-97.8 F (36.6 C)] 97.7 F (36.5 C) (12/15 0507) Pulse Rate:  [68-84] 68 (12/15 1534) Cardiac Rhythm:  [-] Normal sinus rhythm (12/15 0930) Resp:  [20-22] 20 (12/15 1534) BP: (156-168)/(59-85) 156/59 mmHg (12/15 1534) SpO2:  [99 %-100 %] 99 % (12/15 1534) Weight:  [167 lb 15.9 oz (76.2 kg)] 167 lb 15.9 oz (76.2 kg) (12/15 0507)   Recent Labs Lab 07/08/14 2006 07/09/14 0042 07/09/14 0314 07/09/14 0749 07/09/14 1132  GLUCAP 95 105* 102* 92 94    Recent Labs Lab 07/06/14 2143 07/06/14 2153 07/07/14 0100 07/08/14 0815 07/08/14 1855 07/09/14 0625  NA 129* 130* 131* 137  --  135*  K 3.7 3.6* 4.4 3.1*  --  3.1*  CL 90* 96 95* 103  --  100  CO2 19  --  20 22  --  25  GLUCOSE 150* 152* 174* 134*  --  92  BUN 15 16 19 17   --  8  CREATININE 0.84 1.10 0.91 0.76  --  0.53  CALCIUM 10.3  --  8.4 8.8  --  8.8  MG  --   --   --   --  2.1  --     Recent Labs Lab 07/06/14 2143 07/07/14 0100 07/08/14 0815  AST 18 25 14   ALT 11 11 8   ALKPHOS 134* 107 69  BILITOT 0.7 0.9 0.5  PROT 7.4 5.9* 5.6*  ALBUMIN 3.9 3.0* 2.5*    Recent Labs Lab 07/06/14 2143  07/06/14 2153 07/07/14 0415 07/08/14 0815 07/09/14 0625  WBC 14.8*  --  7.1 6.8 5.8  NEUTROABS 12.0*  --   --  5.1  --   HGB 15.0 17.0* 13.7 11.8* 10.4*  HCT 43.9 50.0* 41.2 35.3* 31.5*  MCV 86.9  --  89.4 86.5 86.8  PLT 280  --  242 263 244    Recent Labs Lab 07/06/14 2143 07/07/14 0100 07/07/14 0612 07/08/14 1855 07/09/14 0625  TROPONINI <0.30 <0.30 <0.30 <0.30 <0.30    Recent Labs  07/06/14 2143  LABPROT 15.5*  INR 1.22    Recent Labs  07/07/14 0053  COLORURINE YELLOW  LABSPEC 1.014  PHURINE 6.0  GLUCOSEU NEGATIVE  HGBUR NEGATIVE  BILIRUBINUR NEGATIVE  KETONESUR NEGATIVE  PROTEINUR NEGATIVE  UROBILINOGEN 1.0  NITRITE NEGATIVE  LEUKOCYTESUR NEGATIVE    No results found for: CHOL, TRIG, HDL, CHOLHDL, VLDL, LDLCALC Lab Results  Component Value Date   HGBA1C  10/14/2009    5.7 (NOTE) The ADA recommends the following therapeutic goal for glycemic control related to Hgb A1c measurement: Goal  of therapy: <6.5 Hgb A1c  Reference: American Diabetes Association: Clinical Practice Recommendations 2010, Diabetes Care, 2010, 33: (Suppl  1).      Component Value Date/Time   LABOPIA NONE DETECTED 07/07/2014 0053   COCAINSCRNUR NONE DETECTED 07/07/2014 0053   LABBENZ POSITIVE* 07/07/2014 0053   AMPHETMU POSITIVE* 07/07/2014 0053   THCU NONE DETECTED 07/07/2014 0053   LABBARB NONE DETECTED 07/07/2014 0053     Recent Labs Lab 07/06/14 2143  ETH <11    Ct Abdomen Pelvis Wo Contrast 07/07/2014    Wall thickening in the transverse, descending, and rectosigmoid colon consistent with nonspecific colitis, likely infectious or inflammatory. Small bowel fold thickening suggest enteritis. No evidence of small bowel obstruction. Enteric tube tip is in the distal esophagus and should be advanced if placement in the stomach is desired. Foley and rectal catheters are present.       Ct Head Wo Contrast 07/06/2014    No acute intracranial process on this mildly motion  degraded examination.  Moderate to severe white matter changes suggest chronic small vessel ischemic disease. If clinical concern persists for acute ischemia, MRI of the brain with diffusion-weighted sequences would be more sensitive.    Dg Chest Portable 1 View 07/07/2014    Interval placement of LEFT subclavian central venous catheter, distal tip projecting in mid superior vena cava. No pneumothorax.  Stable cardiomegaly and LEFT lung base atelectasis.      Dg Chest Port 1 View 07/06/2014    Borderline cardiomegaly. LEFT lung base atelectasis versus scarring.      Dg Abd Portable 1v 07/07/2014    Enteric tube tip projects over the distal esophagus. Advancement to the stomach is recommended for better placement.       PHYSICAL EXAM Frail elderly lady not in distress.Awake alert. Afebrile. Head is nontraumatic. Neck is supple without bruit.   l. Cardiac exam no murmur or gallop. Lungs are clear to auscultation. Distal pulses are well felt. Neurological Exam ;  Awake  Alert oriented x 2. Diminished attention, registration and recall. Follows two-step commands. Normal speech and language.eye movements full without nystagmus.fundi were not visualized. Vision acuity and fields appear normal. Hearing is normal. Palatal movements are normal. Face symmetric. Tongue midline. Normal strength, tone, reflexes and coordination. Normal sensation. Gait deferred. ASSESSMENT/PLAN Veronica Key is a 73 y.o. female with history of anxiety, depression, hypertension, COPD, and ongoing tobacco use presenting with facial droop and dysarthria following lightheadedness and subsequent fall. . She did not receive IV t-PA due to mild deficits.  Left BrainTIA:  Non-dominant likely due small vessel disease  In setting of hypotension  Resultant  No deficits  MRI - not ordered as patient not comfortable lying flat due to back pain  MRA - not ordered. See Ct angio brain which was negative  Carotid Doppler -  pending  2D Echo - There was mild concentric hypertrophy. Systolic function was normal. The estimated ejection fraction was in the range of 60% to 65%. Wall motion was normal; there were no regional wall motion abnormalities.  LDL - pending  HgbA1C - pending  Subcutaneous heparin for VTE prophylaxis  Diet clear liquid no liquids  No antithrombotics prior to admission, now on no antithrombotics  Ongoing aggressive stroke risk factor management  Therapy recommendations:  Pending  Disposition: Pending  Hypertension  Home meds: Lotensin 20 mg twice a day  Stable   Hyperlipidemia  Home meds: No statins prior to admission  LDL pending, goal < 70  Add statin if indicated  Continue statin at discharge  Diabetes  HgbA1c pending goal < 7.0  Controlled  Other Stroke Risk Factors  Advanced age  Cigarette smoker, advised to stop smoking  Obesity, Body mass index is 33.91 kg/(m^2).    Other Active Problems  UDS - benzodiazepines and amphetamines  Hypotensive on admission  Hyponatremia - sodium 131  Abdominal pain with nausea and vomiting  Other Pertinent History  Ongoing tobacco use  Hospital day # 3 I have personally examined this patient, reviewed notes, independently viewed imaging studies, participated in medical decision making and plan of care. I have made any additions or clarifications directly to the above note. Agree with note above. Patient had transient slurred speech and right-sided weakness in the setting of hypotension and the etiology of which is pending investigated. Patient is refusing to do an MRI and she will defer weight. Check carotid ultrasound, hemoglobin A1c and lipid profile.  Delia HeadyPramod Sethi, MD Medical Director [facility] Pager: [phone] 07/09/2014 4:36 PM     To contact Stroke Continuity provider, please refer to [email or web address]. After hours, contact General Neurology

## 2014-07-09 NOTE — Progress Notes (Signed)
Lab notified RN that blood drawn at 0100 for troponin was sent to ED tube station instead of lab by mistake. Blood was just found now and is too old to run troponin on. Labs drawn on patient this am may be able to be used for troponin. Will continue to monitor.

## 2014-07-09 NOTE — Progress Notes (Signed)
Subjective: Denies abdominal pain Having BM's  Objective: Vital signs in last 24 hours: Temp:  [97.7 F (36.5 C)-99 F (37.2 C)] 97.7 F (36.5 C) (12/15 0507) Pulse Rate:  [84] 84 (12/14 2329) Resp:  [21-32] 22 (12/15 0507) BP: (148-162)/(58-85) 157/65 mmHg (12/15 0507) SpO2:  [99 %-100 %] 100 % (12/15 0507) Weight:  [167 lb 15.9 oz (76.2 kg)] 167 lb 15.9 oz (76.2 kg) (12/15 0507) Last BM Date: 07/08/14  Intake/Output from previous day: 12/14 0701 - 12/15 0700 In: 1062 [P.O.:222; I.V.:240; IV Piggyback:400] Out: 1500 [Emesis/NG output:1500] Intake/Output this shift: Total I/O In: 120 [P.O.:120] Out: -   Abdomen soft, NT  Lab Results:   Recent Labs  07/08/14 0815 07/09/14 0625  WBC 6.8 5.8  HGB 11.8* 10.4*  HCT 35.3* 31.5*  PLT 263 244   BMET  Recent Labs  07/08/14 0815 07/09/14 0625  NA 137 135*  K 3.1* 3.1*  CL 103 100  CO2 22 25  GLUCOSE 134* 92  BUN 17 8  CREATININE 0.76 0.53  CALCIUM 8.8 8.8   PT/INR  Recent Labs  07/06/14 2143  LABPROT 15.5*  INR 1.22   ABG No results for input(s): PHART, HCO3 in the last 72 hours.  Invalid input(s): PCO2, PO2  Studies/Results: Ct Angio Head W/cm &/or Wo Cm  07/08/2014   CLINICAL DATA:  Code stroke with LEFT brain TIA, 07/06/2014, now resolved. Slight headache.  EXAM: CT ANGIOGRAPHY HEAD  TECHNIQUE: Multidetector CT imaging of the head was performed using the standard protocol during bolus administration of intravenous contrast. Multiplanar CT image reconstructions and MIPs were obtained to evaluate the vascular anatomy.  CONTRAST:  50mL OMNIPAQUE IOHEXOL 350 MG/ML SOLN  COMPARISON:  CT head performed 07/06/2014.  FINDINGS: The RIGHT internal carotid artery displays wide patency in its upper cervical and segment. There is a 50% inferior cavernous stenosis which is calcific. Mild supraclinoid calcification also estimated 50% stenosis.  On the LEFT, there is a 50% distal cervical calcific ICA stenosis.  There is also mild non stenotic cavernous and supraclinoid atherosclerotic calcification.  The basilar is widely patent with both vertebrals contributing, LEFT slightly larger. Minimal distal LEFT vertebral calcification, non stenotic.  There is no proximal stenosis of the anterior, middle, or posterior cerebral arteries no MCA or PCA branch occlusion. No cerebellar branch occlusion. Generalized atrophy with hypoattenuation white matter representing small vessel disease is redemonstrated. No abnormal postcontrast enhancement. Major dural venous sinuses are patent.  Review of the MIP images confirms the above findings.  IMPRESSION: No flow reducing lesion of the intracranial circulation is evident.   Electronically Signed   By: Davonna BellingJohn  Curnes M.D.   On: 07/08/2014 15:27   Dg Abd 1 View  07/08/2014   CLINICAL DATA:  Abdominal pain and vomiting. Evaluate nasogastric tube. Colitis.  EXAM: ABDOMEN - 1 VIEW  COMPARISON:  07/07/2014  FINDINGS: Nasogastric tube in the upper stomach region. There is oral contrast in the right colon. Right colon is dilated but similar to the recent CT. Few dilated loops of small bowel. Evidence for a rectal catheter. Mild scoliosis in lumbar spine with multilevel disc disease.  IMPRESSION: Nasogastric tube in the upper stomach region.  The oral contrast has advanced into the right colon. There continues to be dilatation of the right colon. Dilatation is probably secondary to the wall thickening in the left colon and colitis. Findings could represent at least a partial colonic obstruction.   Electronically Signed   By: Meriel PicaAdam  Henn M.D.  On: 07/08/2014 14:39    Anti-infectives: Anti-infectives    Start     Dose/Rate Route Frequency Ordered Stop   07/07/14 1000  vancomycin (VANCOCIN) 500 mg in sodium chloride 0.9 % 100 mL IVPB  Status:  Discontinued     500 mg100 mL/hr over 60 Minutes Intravenous Every 12 hours 07/07/14 0226 07/08/14 1006   07/07/14 0800  ceFEPIme (MAXIPIME) 2 g in  dextrose 5 % 50 mL IVPB     2 g100 mL/hr over 30 Minutes Intravenous Every 12 hours 07/07/14 0226     07/07/14 0530  vancomycin (VANCOCIN) 50 mg/mL oral solution 500 mg  Status:  Discontinued     500 mg Per Tube 4 times per day 07/07/14 0508 07/07/14 2054   07/07/14 0000  metroNIDAZOLE (FLAGYL) IVPB 500 mg     500 mg100 mL/hr over 60 Minutes Intravenous Every 8 hours 07/06/14 2352     07/06/14 2245  vancomycin (VANCOCIN) 1,250 mg in sodium chloride 0.9 % 250 mL IVPB     1,250 mg166.7 mL/hr over 90 Minutes Intravenous  Once 07/06/14 2236 07/07/14 0116   07/06/14 2245  piperacillin-tazobactam (ZOSYN) IVPB 3.375 g     3.375 g100 mL/hr over 30 Minutes Intravenous  Once 07/06/14 2236 07/06/14 2332      Assessment/Plan: s/p Procedure(s): LEFT HEART CATH (N/A)  Xray's with contrast in left colon. No evidence of bowel obstruction Suspect gastroenteritis  Will sign off for now.  No need for surgical intervention  LOS: 3 days    Devynne Sturdivant A 07/09/2014

## 2014-07-09 NOTE — Progress Notes (Signed)
Patient Name: Veronica Key Date of Encounter: 07/09/2014     Active Problems:   Septic shock   Dysarthria   Abdominal pain   Central line insertion site infection   Hypotension    SUBJECTIVE  No nausea so far today. Denies any CP or SOB. States she "slept like a baby" last night  CURRENT MEDS . antiseptic oral rinse  7 mL Mouth Rinse BID  . aspirin  81 mg Oral Daily  . ceFEPime (MAXIPIME) IV  2 g Intravenous Q12H  . Chlorhexidine Gluconate Cloth  6 each Topical Q0600  . DULoxetine  60 mg Oral Daily  . famotidine (PEPCID) IV  20 mg Intravenous Q24H  . heparin  5,000 Units Subcutaneous 3 times per day  . insulin aspart  0-9 Units Subcutaneous 6 times per day  . labetalol  100 mg Oral BID  . metronidazole  500 mg Intravenous Q8H  . mupirocin ointment  1 application Nasal BID  . oxybutynin  10 mg Oral Daily  . potassium chloride  40 mEq Oral BID    OBJECTIVE  Filed Vitals:   07/08/14 1341 07/08/14 2329 07/09/14 0507 07/09/14 1120  BP: 148/58 162/85 157/65 168/65  Pulse:  84  77  Temp: 99 F (37.2 C) 97.8 F (36.6 C) 97.7 F (36.5 C)   TempSrc:  Oral Oral   Resp: 32 21 22   Height:      Weight:   167 lb 15.9 oz (76.2 kg)   SpO2:  99% 100%     Intake/Output Summary (Last 24 hours) at 07/09/14 1219 Last data filed at 07/09/14 1008  Gross per 24 hour  Intake   1182 ml  Output   1500 ml  Net   -318 ml   Filed Weights   07/07/14 0300 07/08/14 0500 07/09/14 0507  Weight: 161 lb 9.6 oz (73.3 kg) 166 lb 7.2 oz (75.5 kg) 167 lb 15.9 oz (76.2 kg)    PHYSICAL EXAM  General: Pleasant, NAD. Neuro: Alert and oriented X 3. Moves all extremities spontaneously. Psych: Normal affect. HEENT:  Normal NG tube in place Neck: Supple without bruits or JVD. Lungs:  Resp regular and unlabored, CTA, mild R basilar rale Heart: RRR no s3, s4, or murmurs. Abdomen: Soft, non-tender, non-distended, BS + x 4.  Extremities: No clubbing, cyanosis or edema. DP/PT/Radials 2+ and  equal bilaterally.  Accessory Clinical Findings  CBC  Recent Labs  07/06/14 2143  07/08/14 0815 07/09/14 0625  WBC 14.8*  < > 6.8 5.8  NEUTROABS 12.0*  --  5.1  --   HGB 15.0  < > 11.8* 10.4*  HCT 43.9  < > 35.3* 31.5*  MCV 86.9  < > 86.5 86.8  PLT 280  < > 263 244  < > = values in this interval not displayed. Basic Metabolic Panel  Recent Labs  07/08/14 0815 07/08/14 1855 07/09/14 0625  NA 137  --  135*  K 3.1*  --  3.1*  CL 103  --  100  CO2 22  --  25  GLUCOSE 134*  --  92  BUN 17  --  8  CREATININE 0.76  --  0.53  CALCIUM 8.8  --  8.8  MG  --  2.1  --    Liver Function Tests  Recent Labs  07/07/14 0100 07/08/14 0815  AST 25 14  ALT 11 8  ALKPHOS 107 69  BILITOT 0.9 0.5  PROT 5.9* 5.6*  ALBUMIN 3.0*  2.5*    Recent Labs  07/07/14 0100  LIPASE 41   Cardiac Enzymes  Recent Labs  07/07/14 0612 07/08/14 1855 07/09/14 0625  TROPONINI <0.30 <0.30 <0.30    Thyroid Function Tests  Recent Labs  07/06/14 2315  TSH 1.760    TELE NSR with HR 70s, occasional burst of transient tachycardia    ECG  No new EKG  Echocardiogram 07/08/2014  LV EF: 60% -  65%  ------------------------------------------------------------------- Indications:   Syncope 780.2.  ------------------------------------------------------------------- History:  PMH: Obese. Low back pain. Abdominal pain. Chronic obstructive pulmonary disease. Risk factors: Hypertension. Diabetes mellitus.  ------------------------------------------------------------------- Study Conclusions  - Left ventricle: The cavity size was normal. There was mild concentric hypertrophy. Systolic function was normal. The estimated ejection fraction was in the range of 60% to 65%. Wall motion was normal; there were no regional wall motion abnormalities. - Aortic valve: There was trivial regurgitation. - Left atrium: The atrium was mildly dilated.      Radiology/Studies  Ct Abdomen Pelvis Wo Contrast  07/07/2014   CLINICAL DATA:  Nausea and vomiting. Lower abdominal pain. History of small bowel obstructions.  EXAM: CT ABDOMEN AND PELVIS WITHOUT CONTRAST  TECHNIQUE: Multidetector CT imaging of the abdomen and pelvis was performed following the standard protocol without IV contrast.  COMPARISON:  01/30/2014  FINDINGS: Atelectasis in the lung bases. Coronary artery calcifications. There appears to be an enteric tube with tip in the distal esophagus. Suggest advancement of the stomach for better positioning.  The unenhanced appearance of the liver, spleen, pancreas, kidneys, inferior vena cava, and retroperitoneal lymph nodes is unremarkable. Calcification of the abdominal aorta without aneurysm. Right adrenal gland nodule measures 2.8 cm diameter. Density measurements consistent with fat. 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 No change since prior study. Surgical absence of the gallbladder. Mild bile duct dilatation is likely physiologic. Stomach appears normal. Old thickening in the small bowel suggesting enteritis. No evidence of small bowel dilatation. Colon is diffusely stool-filled with evidence of wall thickening in the transverse and descending portions. Mild pericolonic infiltration. This suggests colitis, likely of infectious or inflammatory etiology. No free air or free fluid in the abdomen.  Pelvis: Foley catheter in the bladder. Thickening of the wall of the rectosigmoid colon with pericolonic infiltration consistent with proctitis and colitis. There appears to be a balloon catheter in the rectum. Small amount of free fluid in the pelvis is probably reactive. Appendix is not identified. No pelvic mass or lymphadenopathy. Degenerative changes in the lumbar spine. No destructive bone lesions appreciated.  IMPRESSION: Wall thickening in the transverse, descending, and rectosigmoid colon consistent with nonspecific colitis, likely infectious or inflammatory.  Small bowel fold thickening suggest enteritis. No evidence of small bowel obstruction. Enteric tube tip is in the distal esophagus and should be advanced if placement in the stomach is desired. Foley and rectal catheters are present.   Electronically Signed   By: Burman Nieves M.D.   On: 07/07/2014 03:29   Ct Angio Head W/cm &/or Wo Cm  07/08/2014   CLINICAL DATA:  Code stroke with LEFT brain TIA, 07/06/2014, now resolved. Slight headache.  EXAM: CT ANGIOGRAPHY HEAD  TECHNIQUE: Multidetector CT imaging of the head was performed using the standard protocol during bolus administration of intravenous contrast. Multiplanar CT image reconstructions and MIPs were obtained to evaluate the vascular anatomy.  CONTRAST:  50mL OMNIPAQUE IOHEXOL 350 MG/ML SOLN  COMPARISON:  CT head performed 07/06/2014.  FINDINGS: The RIGHT internal carotid artery displays wide patency in its  upper cervical and segment. There is a 50% inferior cavernous stenosis which is calcific. Mild supraclinoid calcification also estimated 50% stenosis.  On the LEFT, there is a 50% distal cervical calcific ICA stenosis. There is also mild non stenotic cavernous and supraclinoid atherosclerotic calcification.  The basilar is widely patent with both vertebrals contributing, LEFT slightly larger. Minimal distal LEFT vertebral calcification, non stenotic.  There is no proximal stenosis of the anterior, middle, or posterior cerebral arteries no MCA or PCA branch occlusion. No cerebellar branch occlusion. Generalized atrophy with hypoattenuation white matter representing small vessel disease is redemonstrated. No abnormal postcontrast enhancement. Major dural venous sinuses are patent.  Review of the MIP images confirms the above findings.  IMPRESSION: No flow reducing lesion of the intracranial circulation is evident.   Electronically Signed   By: Davonna BellingJohn  Curnes M.D.   On: 07/08/2014 15:27   Dg Abd 1 View  07/08/2014   CLINICAL DATA:  Abdominal pain and  vomiting. Evaluate nasogastric tube. Colitis.  EXAM: ABDOMEN - 1 VIEW  COMPARISON:  07/07/2014  FINDINGS: Nasogastric tube in the upper stomach region. There is oral contrast in the right colon. Right colon is dilated but similar to the recent CT. Few dilated loops of small bowel. Evidence for a rectal catheter. Mild scoliosis in lumbar spine with multilevel disc disease.  IMPRESSION: Nasogastric tube in the upper stomach region.  The oral contrast has advanced into the right colon. There continues to be dilatation of the right colon. Dilatation is probably secondary to the wall thickening in the left colon and colitis. Findings could represent at least a partial colonic obstruction.   Electronically Signed   By: Richarda OverlieAdam  Henn M.D.   On: 07/08/2014 14:39   Ct Head Wo Contrast  07/06/2014   CLINICAL DATA:  Found down, last seen normal at 2030 hr, altered mental status, LEFT-sided weakness and slurred speech.  EXAM: CT HEAD WITHOUT CONTRAST  TECHNIQUE: Contiguous axial images were obtained from the base of the skull through the vertex without intravenous contrast.  COMPARISON:  CT of the head March 01, 2010  FINDINGS: Mild motion degraded examination.  The ventricles and sulci are normal for age. No intraparenchymal hemorrhage, mass effect nor midline shift. Patchy supratentorial and pontine white matter hypodensities are within normal range for patient's age and though non-specific suggest sequelae of chronic small vessel ischemic disease. No acute large vascular territory infarcts.  No abnormal extra-axial fluid collections. Basal cisterns are patent. Moderate calcific atherosclerosis of the carotid siphons and included vertebra arteries.  No skull fracture. The included ocular globes and orbital contents are non-suspicious. Status post bilateral ocular lens implants. RIGHT sphenoid mucosal retention cyst without paranasal sinus air-fluid levels. Patient is edentulous. Soft tissue within the external auditory  canals most consistent with cerumen. 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  IMPRESSION: No acute intracranial process on this mildly motion degraded examination.  Moderate to severe white matter changes suggest chronic small vessel ischemic disease. If clinical concern persists for acute ischemia, MRI of the brain with diffusion-weighted sequences would be more sensitive.  Acute findings discussed with and reconfirmed by Dr.Kirkpatrick on 07/06/2014 at 9:55 pm.   Electronically Signed   By: Awilda Metroourtnay  Bloomer   On: 07/06/2014 22:05   Dg Chest Portable 1 View  07/07/2014   CLINICAL DATA:  Central line placement.  EXAM: PORTABLE CHEST - 1 VIEW  COMPARISON:  Chest radiograph July 06, 2014  FINDINGS: Interval placement of LEFT subclavian central venous catheter with distal tip  projecting in mid superior vena cava. No pneumothorax.  The cardiac silhouette is upper limits of normal in size. Mediastinal silhouette is nonsuspicious, mildly calcified aortic knob. Strandy density persists in LEFT lung base. No pleural effusion. RIGHT humeral arthroplasty. Severe degenerative change of the LEFT shoulder, LEFT distal clavicle osteotomy.  IMPRESSION: Interval placement of LEFT subclavian central venous catheter, distal tip projecting in mid superior vena cava. No pneumothorax.  Stable cardiomegaly and LEFT lung base atelectasis.   Electronically Signed   By: Awilda Metroourtnay  Bloomer   On: 07/07/2014 01:17   Dg Chest Port 1 View  07/06/2014   CLINICAL DATA:  Hypotension, history of COPD, hypertension, shortness of breath and pneumonia.  EXAM: PORTABLE CHEST - 1 VIEW  COMPARISON:  Chest radiograph January 30, 2014  FINDINGS: Cardiac silhouette is upper limits of normal in size. Tortuous, mildly calcified aorta. Strandy densities LEFT lung base. No pleural effusion or focal consolidation. No pneumothorax. Edit  Coil densities in the neck are unchanged, and may be vascular. Status post RIGHT humeral arthroplasty.  Bilateral distal clavicle resection, severe degenerative change LEFT shoulder.  IMPRESSION: Borderline cardiomegaly. LEFT lung base atelectasis versus scarring.   Electronically Signed   By: Awilda Metroourtnay  Bloomer   On: 07/06/2014 22:51   Dg Abd 2 Views  07/09/2014   CLINICAL DATA:  Abdomen pain and distention. Check NG tube and follow progression of colonic contrast. History of colitis  EXAM: ABDOMEN - 2 VIEW  COMPARISON:  the previous day's study  FINDINGS: Nasogastric tube extends to the gastric fundus. Interval clearance of most of the previously noted colonic contrast material, a small residual in the proximal descending segment. There a few gas distended small bowel loops. Multiple fluid levels in the small bowel and proximal colon on decubitus radiographs indicating liquid content. No free air.  Spondylitic changes throughout the lumbar spine. Bilateral pelvic vascular calcifications.  IMPRESSION: 1. Fluid levels in small bowel and proximal colon suggesting adynamic ileus.   Electronically Signed   By: Oley Balmaniel  Hassell M.D.   On: 07/09/2014 11:23   Dg Abd Portable 1v  07/07/2014   CLINICAL DATA:  Abdominal pain.  NG tube placement.  EXAM: PORTABLE ABDOMEN - 1 VIEW  COMPARISON:  01/31/2014  FINDINGS: The enteric tube tip projects over the EG junction region with proximal side hole projected over the lower esophagus. Appearance consistent with placement in the distal esophagus. Residual contrast material in the stomach and small bowel.  IMPRESSION: Enteric tube tip projects over the distal esophagus. Advancement to the stomach is recommended for better placement.   Electronically Signed   By: Burman NievesWilliam  Stevens M.D.   On: 07/07/2014 02:52    ASSESSMENT AND PLAN  1. Tachycardia: appears to be mostly sinus, however also have a component SVT given frequent short bursts. - likely driven by underlying colitis or gastroenteritis, would focus on treating the cause - previously on  labetalol and benazepril at home, both held on arrival given shock and nausea, patient has tolerated gello diet today, PO medication has resumed. Agree with holding benazepril - Echo 07/08/2014 EF 60-65%, no RWMA. Echo reassuring - EKG shows mild ST elevation in inferior lead, however patient continue to denies any CP, interesting there was no ST changes on EKG from Aug 2014, however given lack of CP and negative trop, low likelihood of ACS, may have demand ischemia when her BP in the 80s  - Labetalol restarted yesterday at 100mg  BID, SBP 150-160s, will increase to 200mg  BID. No recurrent NSVT  on telemetry, supraventricular tachycardia improved, HR largely 70s. If BP continue to be stable tomorrow can resume home dose of 400mg  BID otherwise will keep at 200mg  BID. No further cardiac recommendation.   2. 1 episode of 15 beats run of NSVT - continue to monitor, labetalol should help. If has frequent recurrence, can consider amiodarone.   3. Dysarthria/Facial droop/presyncope - seen by neurology, felt to be related to cerebral ischemia in the setting of severe hypotension  4. Colitis: follow by surgery, no surgical intervention at this time, recommended GI consult - appear to be improving, tolerating PO med today.   5. h/o hypertension 6. COPD 7. peptic ulcer disease 8. GERD 9. DM   Signed, Azalee Course PA-C Pager: [phone]  ATTENDING ATTESTATION:   I have seen and evaluated the patient this PM along with Theodore Demark, PA. I agree with HIS findings, examination as well as impression recommendations.  Pt with tachycardia - ? SVT, but was off of BB for NPO -- no further episodes with re-introducing BB. Agree with plan to gradually increase dose back to home dose.    Will monitor & see as needed.   Marykay Lex, M.D., M.S. Interventional Cardiologist   Pager # [phone]

## 2014-07-09 NOTE — Progress Notes (Signed)
PROGRESS NOTE  Veronica Key ZOX:[id number] DOB: 04-27-1941 DOA: 07/06/2014 PCP: [name], PA-C  73 yo female with pmh that includes COPD, SBO, gastric ulcer reports her illness came on suddenly.  She began having simultaneous vomiting and diarrhea and subsequently passed out.  She was found with facial droop and brought in as a code stroke.  The code stroke was cancelled when her CT head showed no acute changes.  She denies feeling poorly recently (prior to her sudden vomiting) and states that she knows of no sick contacts.  She was admitting to the ICU and required pressors.  CT Abdomen showed thickening in the transverse, descending and rectosigmoid colon.    Assessment/Plan:  Distributive Shock Resolved.  Patient's BP was 62/49 on admission. Hypotensive, Hypovolemic and infectious (uncertain etiology). U/a negative.  Blood culture shows NGTD.  Urine culture is negative.  C-diff negative x 2.  Stool culture is negative.  Perhaps this was viral.  Was initially treated with IV and Oral Vanc, Cefepime, and flagyl.  Antibiotics have been narrowed to flagyl.  Admitted to the ICU.  Required norepinephrine.  Now shock has resolved and patient is stable on the floor.  BP medication (Labetalol) being added back as pulse and BP will tolerate.   Continue with IV fluids.   Colitis Appears to be infectious on CT.  Fever now appears resolved.  Possibly ischemic due to low flow state.  C-diff has been ruled out - oral vanc discontinued.  Cefepime d/c'd on 12/15.  Will continue flagyl for now.  Stool culture negative.  Rectal bag in place.  When diarrhea tapers will d/c flexiseal ASAP. Patient reports she has never had a colonoscopy because she is afraid to do so.  Syncope.  Probable TIA CT head negative.    CTA head is negative.  Patient is unable to lay flat for an MRI. 2D echo completed - normal.  Results below.  Carotid Dopplers pending Has been seen by stroke team.  [name] believes she had a  TIA secondary to low blood flow.  He recommends 81 mg aspirin daily when patient is able to take it.  Inferior ST segment elevation on EKG w/o reciprocal changes. Resolved on subsequent EKG.  2D echo is normal.  Troponin cycled and are negative. Patient without chest pain.  Vomiting / Diarrhea Uncertain etiology.  Presumed infectious/viral  N/G clamped overnight without vomiting.  Will d/c 12/15. Flexiseal in place.  Will d/c when diarrhea decreases.  Have requested RN to strictly track Is and Os and mark flexi pouch. If diarrhea does not improve quickly will consider GI consult  Adynamic Ileus (12/15) Patient's bowel sounds are tympanic. Ileus evidenced by Air fluid levels on xray.  Increase K++ to 4.5 - 5.0.  Mobilize patient.  Eliminate narcotics and anticholinergics.  Normocytic Anemia Hgb has trended down in patient.  This is concerning as patient's bowel mvmts are very dark. Guiac stool.  Continue to monitor.  Will hold on starting ASA 81 mg until more stable. Hold heparin.   Hypokalemia Secondary to GI losses Replete with IV KCL and oral KCL.  Mg replaced.   Amphetamine Positive  On tox screen.  ??  Hypertension On Benazepril and Labetalol at home.  These were held due to hypotensive shock.  Patient had nonsustained Vtach and cardiology was consulted.  Labetalol was restarted at low dose and runs of vtach subsided.  Patient's BP and pulse rate are rising.  Will continue to add back labetalol  at decreased dose and continue to monitor.  Chronic pain - bilateral shoulders. On oxy at home.  Changed to fentanyl while patient has n/g in place. Resume oxy when N/g is removed and patient can take POs.    DVT Prophylaxis:  heparin  Code Status: DNR Family Communication: Patient alert and orientated. Disposition Plan: inpatient.  PT ordered   SIGNIFICANT EVENTS / STUDIES:  1. 07/06/14 - EMS --> ED after found down with facial droop  2. 07/06/14 - CT head shows chronic  small vessel disease but no acute process - code stroke canceled 3. 07/06/14 - EKG with ST segment elevation in inferior leads - code STEMI canceled by cardiology 4. 07/06/14 - Norepi started for shock, CCM called for admission  LINES / TUBES: 1. PIVs 07/06/14 >> 2. Foley 07/06/14 >>12/15 3. Left subclavian CVC 07/06/14 >> 4. N/G d/c'd 12/15. 5. Rectal   CULTURES: 1. 07/06/14 blood cx x2 >>NGTD 2. 07/07/14 urine cx >>final 3. 07/07/14 c diff screen >>negative x 2  ANTIBIOTICS: 1. Zosyn (one dose) 07/06/14 2. Vancomycin 07/06/14 >>12/14 3. Cefepime 07/07/14 >>07/09/2014 4. Flagyl 07/07/14 >> 5. Oral vanc 12/12>>>12/13   Antibiotics: Anti-infectives    Start     Dose/Rate Route Frequency Ordered Stop   07/07/14 1000  vancomycin (VANCOCIN) 500 mg in sodium chloride 0.9 % 100 mL IVPB  Status:  Discontinued     500 mg100 mL/hr over 60 Minutes Intravenous Every 12 hours 07/07/14 0226 07/08/14 1006   07/07/14 0800  ceFEPIme (MAXIPIME) 2 g in dextrose 5 % 50 mL IVPB  Status:  Discontinued     2 g100 mL/hr over 30 Minutes Intravenous Every 12 hours 07/07/14 0226 07/09/14 1234   07/07/14 0530  vancomycin (VANCOCIN) 50 mg/mL oral solution 500 mg  Status:  Discontinued     500 mg Per Tube 4 times per day 07/07/14 0508 07/07/14 2054   07/07/14 0000  metroNIDAZOLE (FLAGYL) IVPB 500 mg     500 mg100 mL/hr over 60 Minutes Intravenous Every 8 hours 07/06/14 2352     07/06/14 2245  vancomycin (VANCOCIN) 1,250 mg in sodium chloride 0.9 % 250 mL IVPB     1,250 mg166.7 mL/hr over 90 Minutes Intravenous  Once 07/06/14 2236 07/07/14 0116   07/06/14 2245  piperacillin-tazobactam (ZOSYN) IVPB 3.375 g     3.375 g100 mL/hr over 30 Minutes Intravenous  Once 07/06/14 2236 07/06/14 2332        HPI/Subjective: I'm feeling better.  Wants to get N/G out.  Objective: Filed Vitals:   07/08/14 1341 07/08/14 2329 07/09/14 0507 07/09/14 1120  BP: 148/58 162/85 157/65 168/65  Pulse:  84  77  Temp: 99 F  (37.2 C) 97.8 F (36.6 C) 97.7 F (36.5 C)   TempSrc:  Oral Oral   Resp: 32 21 22   Height:      Weight:   76.2 kg (167 lb 15.9 oz)   SpO2:  99% 100%     Intake/Output Summary (Last 24 hours) at 07/09/14 1251 Last data filed at 07/09/14 1008  Gross per 24 hour  Intake   1182 ml  Output   1500 ml  Net   -318 ml   Filed Weights   07/07/14 0300 07/08/14 0500 07/09/14 0507  Weight: 73.3 kg (161 lb 9.6 oz) 75.5 kg (166 lb 7.2 oz) 76.2 kg (167 lb 15.9 oz)    Exam: General: obese, elderly female,  NAD, appears stated age.  Lying comfortably in bed.  N/G in  place.   HEENT:   EOMI, Anicteic Sclera, MMM. Dried drainage around right eye. Edentulous.   Neck: Supple, thick, no JVD, no masses  Cardiovascular: RRR, S1 S2 auscultated, no rubs, murmurs or gallops.   Respiratory: Clear to auscultation bilaterally with equal chest rise  Abdomen: Obese, less tender to palpation.  Soft distended, Tympanic BS.  Black "water" in rectal bag.  Flexiseal in place. Extremities: warm dry without cyanosis clubbing or edema.  Neuro: AAOx3, cranial nerves grossly intact. Able to move all extremities, speech is clear. Skin: redness across the left face.  No frank rash or bruising. Psych: Normal affect and demeanor with intact judgement and insight   Data Reviewed:  2D Echo Study Conclusions  - Left ventricle: The cavity size was normal. There was mild concentric hypertrophy. Systolic function was normal. The estimated ejection fraction was in the range of 60% to 65%. Wall motion was normal; there were no regional wall motion abnormalities. - Aortic valve: There was trivial regurgitation. - Left atrium: The atrium was mildly dilated.  Basic Metabolic Panel:  Recent Labs Lab 07/06/14 2143 07/06/14 2153 07/07/14 0100 07/08/14 0815 07/08/14 1855 07/09/14 0625  NA 129* 130* 131* 137  --  135*  K 3.7 3.6* 4.4 3.1*  --  3.1*  CL 90* 96 95* 103  --  100  CO2 19  --  20 22  --  25  GLUCOSE  150* 152* 174* 134*  --  92  BUN 15 16 19 17   --  8  CREATININE 0.84 1.10 0.91 0.76  --  0.53  CALCIUM 10.3  --  8.4 8.8  --  8.8  MG  --   --   --   --  2.1  --    Liver Function Tests:  Recent Labs Lab 07/06/14 2143 07/07/14 0100 07/08/14 0815  AST 18 25 14   ALT 11 11 8   ALKPHOS 134* 107 69  BILITOT 0.7 0.9 0.5  PROT 7.4 5.9* 5.6*  ALBUMIN 3.9 3.0* 2.5*    Recent Labs Lab 07/07/14 0100  LIPASE 41   CBC:  Recent Labs Lab 07/06/14 2143 07/06/14 2153 07/07/14 0415 07/08/14 0815 07/09/14 0625  WBC 14.8*  --  7.1 6.8 5.8  NEUTROABS 12.0*  --   --  5.1  --   HGB 15.0 17.0* 13.7 11.8* 10.4*  HCT 43.9 50.0* 41.2 35.3* 31.5*  MCV 86.9  --  89.4 86.5 86.8  PLT 280  --  242 263 244   Cardiac Enzymes:  Recent Labs Lab 07/06/14 2143 07/07/14 0100 07/07/14 0612 07/08/14 1855 07/09/14 0625  TROPONINI <0.30 <0.30 <0.30 <0.30 <0.30   CBG:  Recent Labs Lab 07/08/14 2006 07/09/14 0042 07/09/14 0314 07/09/14 0749 07/09/14 1132  GLUCAP 95 105* 102* 92 94    Recent Results (from the past 240 hour(s))  Blood Culture (routine x 2)     Status: None (Preliminary result)   Collection Time: 07/06/14 10:24 PM  Result Value Ref Range Status   Specimen Description BLOOD LEFT HAND  Final   Special Requests BOTTLES DRAWN AEROBIC ONLY 3CC  Final   Culture  Setup Time   Final    07/07/2014 10:18 Performed at [facility]    Culture   Final           BLOOD CULTURE RECEIVED NO GROWTH TO DATE CULTURE WILL BE HELD FOR 5 DAYS BEFORE ISSUING A FINAL NEGATIVE REPORT Performed at [facility]    Report  Status PENDING  Incomplete  Blood Culture (routine x 2)     Status: None (Preliminary result)   Collection Time: 07/06/14 10:31 PM  Result Value Ref Range Status   Specimen Description BLOOD RIGHT ANTECUBITAL  Final   Special Requests BOTTLES DRAWN AEROBIC AND ANAEROBIC 5CC EA  Final   Culture  Setup Time   Final    07/07/2014 10:18 Performed at [facility]    Culture   Final           BLOOD CULTURE RECEIVED NO GROWTH TO DATE CULTURE WILL BE HELD FOR 5 DAYS BEFORE ISSUING A FINAL NEGATIVE REPORT Performed at [facility]    Report Status PENDING  Incomplete  Clostridium Difficile by PCR     Status: None   Collection Time: 07/07/14 12:52 AM  Result Value Ref Range Status   C difficile by pcr NEGATIVE NEGATIVE Final  Urine culture     Status: None   Collection Time: 07/07/14 12:53 AM  Result Value Ref Range Status   Specimen Description URINE, CATHETERIZED  Final   Special Requests URINE, RANDOM  Final   Culture  Setup Time   Final    07/07/2014 11:15 Performed at [facility]    Colony Count NO GROWTH Performed at [facility]   Final   Culture NO GROWTH Performed at [facility]   Final   Report Status 07/08/2014 FINAL  Final  MRSA PCR Screening     Status: Abnormal   Collection Time: 07/07/14  3:43 AM  Result Value Ref Range Status   MRSA by PCR POSITIVE (A) NEGATIVE Final    Comment:        The GeneXpert MRSA Assay (FDA approved for NASAL specimens only), is one component of a comprehensive MRSA colonization surveillance program. It is not intended to diagnose MRSA infection nor to guide or monitor treatment for MRSA infections. RESULT CALLED TO, READ BACK BY AND VERIFIED WITH: [facility]CM[facility],C RN [id number] 07/07/14 MITCHELL,L   Stool culture     Status: None (Preliminary result)   Collection Time: 07/07/14 12:00 PM  Result Value Ref Range Status   Specimen Description STOOL  Final   Special Requests NONE  Final   Culture   Final    NO SUSPICIOUS COLONIES, CONTINUING TO HOLD Note: REDUCED NORMAL FLORA PRESENT Performed at [facility]    Report Status PENDING  Incomplete  Clostridium Difficile by PCR     Status: None   Collection Time: 07/07/14 12:57 PM  Result Value Ref Range Status   C difficile by pcr NEGATIVE NEGATIVE Final     Studies: Ct Abdomen Pelvis Wo  Contrast  07/07/2014   CLINICAL DATA:  Nausea and vomiting. Lower abdominal pain. History of small bowel obstructions.  EXAM: CT ABDOMEN AND PELVIS WITHOUT CONTRAST  TECHNIQUE: Multidetector CT imaging of the abdomen and pelvis was performed following the standard protocol without IV contrast.  COMPARISON:  01/30/2014  FINDINGS: Atelectasis in the lung bases. Coronary artery calcifications. There appears to be an enteric tube with tip in the distal esophagus. Suggest advancement of the stomach for better positioning.  The unenhanced appearance of the liver, spleen, pancreas, kidneys, inferior vena cava, and retroperitoneal lymph nodes is unremarkable. Calcification of the abdominal aorta without aneurysm. Right adrenal gland nodule measures 2.8 cm diameter. Density measurements consistent with fat. 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 No change since prior study. Surgical absence of the gallbladder. Mild bile duct dilatation is likely  physiologic. Stomach appears normal. Old thickening in the small bowel suggesting enteritis. No evidence of small bowel dilatation. Colon is diffusely stool-filled with evidence of wall thickening in the transverse and descending portions. Mild pericolonic infiltration. This suggests colitis, likely of infectious or inflammatory etiology. No free air or free fluid in the abdomen.  Pelvis: Foley catheter in the bladder. Thickening of the wall of the rectosigmoid colon with pericolonic infiltration consistent with proctitis and colitis. There appears to be a balloon catheter in the rectum. Small amount of free fluid in the pelvis is probably reactive. Appendix is not identified. No pelvic mass or lymphadenopathy. Degenerative changes in the lumbar spine. No destructive bone lesions appreciated.  IMPRESSION: Wall thickening in the transverse, descending, and rectosigmoid colon consistent with nonspecific colitis, likely infectious or inflammatory. Small bowel fold thickening suggest enteritis. No  evidence of small bowel obstruction. Enteric tube tip is in the distal esophagus and should be advanced if placement in the stomach is desired. Foley and rectal catheters are present.   Electronically Signed   By: Burman Nieves M.D.   On: 07/07/2014 03:29   Ct Head Wo Contrast  07/06/2014   CLINICAL DATA:  Found down, last seen normal at 2030 hr, altered mental status, LEFT-sided weakness and slurred speech.  EXAM: CT HEAD WITHOUT CONTRAST  TECHNIQUE: Contiguous axial images were obtained from the base of the skull through the vertex without intravenous contrast.  COMPARISON:  CT of the head March 01, 2010  FINDINGS: Mild motion degraded examination.  The ventricles and sulci are normal for age. No intraparenchymal hemorrhage, mass effect nor midline shift. Patchy supratentorial and pontine white matter hypodensities are within normal range for patient's age and though non-specific suggest sequelae of chronic small vessel ischemic disease. No acute large vascular territory infarcts.  No abnormal extra-axial fluid collections. Basal cisterns are patent. Moderate calcific atherosclerosis of the carotid siphons and included vertebra arteries.  No skull fracture. The included ocular globes and orbital contents are non-suspicious. Status post bilateral ocular lens implants. RIGHT sphenoid mucosal retention cyst without paranasal sinus air-fluid levels. Patient is edentulous. Soft tissue within the external auditory canals most consistent with cerumen. 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  IMPRESSION: No acute intracranial process on this mildly motion degraded examination.  Moderate to severe white matter changes suggest chronic small vessel ischemic disease. If clinical concern persists for acute ischemia, MRI of the brain with diffusion-weighted sequences would be more sensitive.  Acute findings discussed with and reconfirmed by Dr.Kirkpatrick on 07/06/2014 at 9:55 pm.   Electronically Signed    By: Awilda Metro   On: 07/06/2014 22:05   Dg Chest Portable 1 View  07/07/2014   CLINICAL DATA:  Central line placement.  EXAM: PORTABLE CHEST - 1 VIEW  COMPARISON:  Chest radiograph July 06, 2014  FINDINGS: Interval placement of LEFT subclavian central venous catheter with distal tip projecting in mid superior vena cava. No pneumothorax.  The cardiac silhouette is upper limits of normal in size. Mediastinal silhouette is nonsuspicious, mildly calcified aortic knob. Strandy density persists in LEFT lung base. No pleural effusion. RIGHT humeral arthroplasty. Severe degenerative change of the LEFT shoulder, LEFT distal clavicle osteotomy.  IMPRESSION: Interval placement of LEFT subclavian central venous catheter, distal tip projecting in mid superior vena cava. No pneumothorax.  Stable cardiomegaly and LEFT lung base atelectasis.   Electronically Signed   By: Awilda Metro   On: 07/07/2014 01:17   Dg Chest Port 1 View  07/06/2014  CLINICAL DATA:  Hypotension, history of COPD, hypertension, shortness of breath and pneumonia.  EXAM: PORTABLE CHEST - 1 VIEW  COMPARISON:  Chest radiograph January 30, 2014  FINDINGS: Cardiac silhouette is upper limits of normal in size. Tortuous, mildly calcified aorta. Strandy densities LEFT lung base. No pleural effusion or focal consolidation. No pneumothorax. Edit  Coil densities in the neck are unchanged, and may be vascular. Status post RIGHT humeral arthroplasty. Bilateral distal clavicle resection, severe degenerative change LEFT shoulder.  IMPRESSION: Borderline cardiomegaly. LEFT lung base atelectasis versus scarring.   Electronically Signed   By: Awilda Metro   On: 07/06/2014 22:51   Dg Abd Portable 1v  07/07/2014   CLINICAL DATA:  Abdominal pain.  NG tube placement.  EXAM: PORTABLE ABDOMEN - 1 VIEW  COMPARISON:  01/31/2014  FINDINGS: The enteric tube tip projects over the EG junction region with proximal side hole projected over the lower esophagus.  Appearance consistent with placement in the distal esophagus. Residual contrast material in the stomach and small bowel.  IMPRESSION: Enteric tube tip projects over the distal esophagus. Advancement to the stomach is recommended for better placement.   Electronically Signed   By: Burman Nieves M.D.   On: 07/07/2014 02:52    Scheduled Meds: . antiseptic oral rinse  7 mL Mouth Rinse BID  . aspirin  81 mg Oral Daily  . Chlorhexidine Gluconate Cloth  6 each Topical Q0600  . DULoxetine  60 mg Oral Daily  . famotidine (PEPCID) IV  20 mg Intravenous Q24H  . heparin  5,000 Units Subcutaneous 3 times per day  . insulin aspart  0-9 Units Subcutaneous 6 times per day  . labetalol  200 mg Oral BID  . metronidazole  500 mg Intravenous Q8H  . mupirocin ointment  1 application Nasal BID  . oxybutynin  10 mg Oral Daily  . potassium chloride  10 mEq Intravenous Q1 Hr x 4  . potassium chloride  40 mEq Oral BID   Continuous Infusions: . sodium chloride 0.9 % 1,000 mL with potassium chloride 40 mEq infusion      Active Problems:   Septic shock   Dysarthria   Abdominal pain   Central line insertion site infection   Hypotension   Adynamic ileus    Conley Canal  [facility] Pager [phone]. If 7PM-7AM, please contact night-coverage at [email or web address], password [email or web address] 07/09/2014, 12:51 PM  LOS: 3 days   Patient seen and examined. Agree with Assesment and plan as above.  She is feeling better. Denies abdominal pain, NG tube has been clamp since last night. No abdominal distension. KUB with ileus no obstruction. Continue with IV antibiotics.

## 2014-07-10 ENCOUNTER — Inpatient Hospital Stay (HOSPITAL_COMMUNITY): Payer: Medicare Other

## 2014-07-10 DIAGNOSIS — G459 Transient cerebral ischemic attack, unspecified: Secondary | ICD-10-CM

## 2014-07-10 LAB — GLUCOSE, CAPILLARY
GLUCOSE-CAPILLARY: 116 mg/dL — AB (ref 70–99)
Glucose-Capillary: 122 mg/dL — ABNORMAL HIGH (ref 70–99)
Glucose-Capillary: 151 mg/dL — ABNORMAL HIGH (ref 70–99)
Glucose-Capillary: 93 mg/dL (ref 70–99)
Glucose-Capillary: 93 mg/dL (ref 70–99)
Glucose-Capillary: 94 mg/dL (ref 70–99)

## 2014-07-10 LAB — CBC
HEMATOCRIT: 30.7 % — AB (ref 36.0–46.0)
Hemoglobin: 10.2 g/dL — ABNORMAL LOW (ref 12.0–15.0)
MCH: 29.1 pg (ref 26.0–34.0)
MCHC: 33.2 g/dL (ref 30.0–36.0)
MCV: 87.5 fL (ref 78.0–100.0)
Platelets: 246 10*3/uL (ref 150–400)
RBC: 3.51 MIL/uL — ABNORMAL LOW (ref 3.87–5.11)
RDW: 14 % (ref 11.5–15.5)
WBC: 5.6 10*3/uL (ref 4.0–10.5)

## 2014-07-10 LAB — BASIC METABOLIC PANEL
Anion gap: 11 (ref 5–15)
BUN: 7 mg/dL (ref 6–23)
CALCIUM: 8.7 mg/dL (ref 8.4–10.5)
CHLORIDE: 100 meq/L (ref 96–112)
CO2: 23 mEq/L (ref 19–32)
CREATININE: 0.49 mg/dL — AB (ref 0.50–1.10)
GFR calc non Af Amer: >90 mL/min (ref >90)
Glucose, Bld: 88 mg/dL (ref 70–99)
Potassium: 4 mEq/L (ref 3.7–5.3)
Sodium: 134 mEq/L — ABNORMAL LOW (ref 137–147)

## 2014-07-10 LAB — STOOL CULTURE

## 2014-07-10 LAB — HEMOGLOBIN A1C
Hgb A1c MFr Bld: 5.6 % (ref <5.7)
Mean Plasma Glucose: 114 mg/dL (ref <117)

## 2014-07-10 MED ORDER — DEXTROSE 5 % IV SOLN
2.0000 g | Freq: Two times a day (BID) | INTRAVENOUS | Status: DC
  Administered 2014-07-10 – 2014-07-14 (×10): 2 g via INTRAVENOUS
  Filled 2014-07-10 (×12): qty 2

## 2014-07-10 MED ORDER — POTASSIUM CHLORIDE CRYS ER 20 MEQ PO TBCR
20.0000 meq | EXTENDED_RELEASE_TABLET | Freq: Once | ORAL | Status: AC
  Administered 2014-07-10: 20 meq via ORAL
  Filled 2014-07-10: qty 1

## 2014-07-10 MED ORDER — BENAZEPRIL HCL 20 MG PO TABS: 20.0000 mg | ORAL_TABLET | Freq: Every day | ORAL | Status: DC

## 2014-07-10 MED ORDER — BENAZEPRIL HCL 20 MG PO TABS
20.0000 mg | ORAL_TABLET | Freq: Two times a day (BID) | ORAL | Status: DC
  Administered 2014-07-10 – 2014-07-14 (×9): 20 mg via ORAL
  Filled 2014-07-10 (×10): qty 1

## 2014-07-10 MED ORDER — ACETAMINOPHEN 325 MG PO TABS: 650.0000 mg | ORAL_TABLET | Freq: Four times a day (QID) | ORAL | Status: DC | PRN

## 2014-07-10 NOTE — Progress Notes (Addendum)
   Telemetry reviewed. Pt in NSR with HR in the 60s. No further tachycardia/ SVT captured. Continue to titrate BB therapy as per yesterday's note. Cardiology will sign off for now. Call if any other needs.  SIMMONS, BRITTAINY 07/10/2014  Agree.  Will sign off.  I did increase labetalol to 300 mg bid _> can likely increase to 400 mg bid for d/c. Call with ??s  Marykay LexHARDING,DAVID W, M.D., M.S. Interventional Cardiologist   Pager # [phone]

## 2014-07-10 NOTE — Progress Notes (Signed)
Blood culture from anaerobic bottle resulted gram negative rods.Call placed to Maren ReamerKaren Kirby NP new orders received.

## 2014-07-10 NOTE — Clinical Social Work Psychosocial (Signed)
Clinical Social Work Department BRIEF PSYCHOSOCIAL ASSESSMENT 07/10/2014  Patient:  Veronica Key, Veronica Key     Account Number:  [id number]     Admit date:  07/06/2014  Clinical Social Worker:  Lovey Newcomer  Date/Time:  07/10/2014 02:45 PM  Referred by:  Physician  Date Referred:  07/10/2014 Referred for  SNF Placement   Other Referral:   NA   Interview type:  Patient Other interview type:   Patient alert and oriented at time of assessment.    PSYCHOSOCIAL DATA Living Status:  ALONE Admitted from facility:   Level of care:   Primary support name:  Billy Primary support relationship to patient:  CHILD, ADULT Degree of support available:   Support is good.    CURRENT CONCERNS Current Concerns  Post-Acute Placement   Other Concerns:   NA    SOCIAL WORK ASSESSMENT / PLAN CSW met with patient at bedside to complete assessment. Patient states that she lives alone but has assistance from an aid at home. Patient states that she has had past SNF stays at [facility] and would be agreeable to going to SNF at discharge if necessary. Patient states that he son lives in [place], [place] where he is a Theme park manager of a church. She states that her son told her, "I will take you home before you go to a nursing home." Despite this patient is agreeable to SNF if needed. CSW explained SNF search/placement process and answered patient's questions. Patient appeared calm and engaged in assessment. CSW will follow up with available bed offers.   Assessment/plan status:  Psychosocial Support/Ongoing Assessment of Needs Other assessment/ plan:   Complete FL2, Fax, PASRR   Information/referral to community resources:   CSW contact information and SNF list given.    PATIENT'S/FAMILY'S RESPONSE TO PLAN OF CARE: Patient states that she is agreeable to SNF placement at discharge if needed. CSW will assist.         Liz Beach MSW, [place], [place], [phone]

## 2014-07-10 NOTE — Progress Notes (Signed)
VASCULAR LAB PRELIMINARY  PRELIMINARY  PRELIMINARY  PRELIMINARY  Carotid duplex  completed.    Preliminary report:  Bilateral:  1-39% ICA stenosis.  Vertebral artery flow is antegrade.      Stephone Gum, RVT 07/10/2014, 4:50 PM

## 2014-07-10 NOTE — Clinical Social Work Placement (Signed)
Clinical Social Work Department CLINICAL SOCIAL WORK PLACEMENT NOTE 07/10/2014  Patient:  Veronica Key,Veronica Key  Account Number:  [id number] Admit date:  07/06/2014  Clinical Social Worker:  Cherre BlancJOSEPH BRYANT Ugonna Keirsey, [place]  Date/time:  07/10/2014 04:00 PM  Clinical Social Work is seeking post-discharge placement for this patient at the following level of care:   SKILLED NURSING   (*CSW will update this form in Epic as items are completed)   07/10/2014  Patient/family provided with [facility] Department of Clinical Social Work's list of facilities offering this level of care within the geographic area requested by the patient (or if unable, by the patient's family).  07/10/2014  Patient/family informed of their freedom to choose among providers that offer the needed level of care, that participate in Medicare, Medicaid or managed care program needed by the patient, have an available bed and are willing to accept the patient.  07/10/2014  Patient/family informed of [facility]' ownership interest in [facility], as well as of the fact that they are under no obligation to receive care at this facility.  PASARR submitted to EDS on  PASARR number received on   FL2 transmitted to all facilities in geographic area requested by pt/family on  07/10/2014 FL2 transmitted to all facilities within larger geographic area on   Patient informed that his/her managed care company has contracts with or will negotiate with  certain facilities, including the following:     Patient/family informed of bed offers received:   Patient chooses bed at  Physician recommends and patient chooses bed at    Patient to be transferred to  on   Patient to be transferred to facility by  Patient and family notified of transfer on  Name of family member notified:    The following physician request were entered in Epic:   Additional Comments:    Roddie McBryant Charle Mclaurin MSW, [place], [place], [phone]

## 2014-07-10 NOTE — Progress Notes (Signed)
PROGRESS NOTE  Hartford PoliZula L Krotz ZOX:[id number] DOB: [birth year] DOA: 07/06/2014 PCP: Frazier RichardsIXON,MARY BETH, PA-C  73 yo female with pmh that includes COPD, SBO, gastric ulcer reports her illness came on suddenly.  She began having simultaneous vomiting and diarrhea and subsequently passed out.  She was found with facial droop and brought in as a code stroke.  The code stroke was cancelled when her CT head showed no acute changes.  She denies feeling poorly recently (prior to her sudden vomiting) and states that she knows of no sick contacts.  She was admitting to the ICU and required pressors.  CT Abdomen showed thickening in the transverse, descending and rectosigmoid colon.    Assessment/Plan:  Distributive Shock Resolved.  Patient's BP was 62/49 on admission. Hypotensive, Hypovolemic and infectious.  One of 2 blood cultures is positive for gram-negative .   Rods.  Urine culture is negative.  C-diff negative x 2.  Stool culture is negative.  Was initially treated with IV and Oral Vanc, Cefepime, and flagyl.  Antibiotics have been narrowed to cefepime and flagyl.   she was initially admitted to the ICU and required norepinephrine.  Now shock has resolved and patient is stable on the floor.  BP medications are  being added back as pulse and BP will tolerate.   Continue with IV fluids at 50 mL per hour as of 12/16.   Colitis Appears to be infectious on CT.  Fever now resolved.  Possibly ischemic due to low flow state.  C-diff has been ruled out - oral vanc discontinued.  Stool culture negative.  Rectal bag fell out and was left out purposefully.   Patient reports she has never had a colonoscopy because she is afraid to do so.  [facility] has consulted and recommends a flexible sigmoidoscopy which is scheduled for December 17. We appreciate Dr. Randa EvensEdwards recommendations.   Gram negative bacteremia Patient is positive in one of 2 blood cultures for gram-negative rods. Sensitivities are pending. She  is currently receiving IV cefepime and Flagyl.  We believe the source may be translocation from the gut.  Will check culture on central line cath tip 12/16.  Syncope.  Probable TIA CT head negative.    CTA head is negative.  Patient is unable to lay flat for an MRI. 2D echo completed - normal.  Results below.  Carotid Dopplers pending Has been seen by stroke team.  Dr. Pearlean BrownieSethi believes she had a TIA secondary to low blood flow.  He recommends 81 mg aspirin daily when patient is able to take it.  Inferior ST segment elevation on EKG w/o reciprocal changes. Resolved on subsequent EKG.  2D echo is normal.  Troponin cycled and are negative. Patient without chest pain.  Vomiting / Diarrhea Patient improving. NG removed December 15. Flexiseal removed December 16.  Patient tolerating a clear liquid diet. Still with black watery stool. Strict I's and O's.   Adynamic Ileus (12/15) Patient's bowel sounds are tympanic.  repeat x-ray 12/16 does not show improvement. Appreciate GI consultation.  Increase K++ to 4.5 - 5.0.  Mobilize patient.  Eliminate narcotics and anticholinergics.  Normocytic Anemia Hgb has trended down while the patient was in the hospital, but appears to have stabilized at approximately 10.  Guiac stool.  Continue to monitor.  Will hold on starting ASA 81 mg until more stable. Hold heparin.   Hypokalemia Secondary to GI losses.  Replete with IVF.  Mg replaced.   Amphetamine Positive  On tox screen.  ??  Hypertension On Benazepril and Labetalol at home.  These were held initially  due to hypotensive shock.  Patient had nonsustained Vtach and cardiology was consulted.  Labetalol was restarted at low dose and runs of vtach subsided.  Patient's BP and pulse rate are rising.  Will continue to add back blood pressure medications as patient improves. (Added back Lotensin 20 mg twice a day on 12/16)  Chronic pain - bilateral shoulders. Reports she is on oxy at home, but this is not on  her med list.  She does not appear to be on any pain medication in house. Will add Tylenol     DVT Prophylaxis:  SCDs   Code Status: DNR Family Communication: Patient alert and orientated. Disposition Plan: inpatient.  PT recommends SNF rehab.   SIGNIFICANT EVENTS / STUDIES:  1. 07/06/14 - EMS --> ED after found down with facial droop  2. 07/06/14 - CT head shows chronic small vessel disease but no acute process - code stroke canceled 3. 07/06/14 - EKG with ST segment elevation in inferior leads - code STEMI canceled by cardiology 4. 07/06/14 - Norepi started for shock, CCM called for admission  LINES / TUBES: 1. PIVs 07/06/14 >> 2. Foley 07/06/14 >>12/15 3. Left subclavian CVC 07/06/14 >>12/16 (check culture of tip) 4. N/G d/c'd 12/15. 5. Rectal dc'd 12/16  CULTURES: 1. 07/06/14 blood cx x2 >> 1 of 2 with gram-negative rods  2. 07/07/14 urine cx >>final 3. 07/07/14 c diff screen >>negative x 2  ANTIBIOTICS: 1. Zosyn (one dose) 07/06/14 2. Vancomycin 07/06/14 >>12/14 3. Cefepime 07/07/14 >> 4. Flagyl 07/07/14 >> 5. Oral vanc 12/12>>>12/13   Antibiotics: Anti-infectives    Start     Dose/Rate Route Frequency Ordered Stop   07/10/14 0045  ceFEPIme (MAXIPIME) 2 g in dextrose 5 % 50 mL IVPB     2 g100 mL/hr over 30 Minutes Intravenous Every 12 hours 07/10/14 0042     07/07/14 1000  vancomycin (VANCOCIN) 500 mg in sodium chloride 0.9 % 100 mL IVPB  Status:  Discontinued     500 mg100 mL/hr over 60 Minutes Intravenous Every 12 hours 07/07/14 0226 07/08/14 1006   07/07/14 0800  ceFEPIme (MAXIPIME) 2 g in dextrose 5 % 50 mL IVPB  Status:  Discontinued     2 g100 mL/hr over 30 Minutes Intravenous Every 12 hours 07/07/14 0226 07/09/14 1234   07/07/14 0530  vancomycin (VANCOCIN) 50 mg/mL oral solution 500 mg  Status:  Discontinued     500 mg Per Tube 4 times per day 07/07/14 0508 07/07/14 2054   07/07/14 0000  metroNIDAZOLE (FLAGYL) IVPB 500 mg     500 mg100 mL/hr over 60  Minutes Intravenous Every 8 hours 07/06/14 2352     07/06/14 2245  vancomycin (VANCOCIN) 1,250 mg in sodium chloride 0.9 % 250 mL IVPB     1,250 mg166.7 mL/hr over 90 Minutes Intravenous  Once 07/06/14 2236 07/07/14 0116   07/06/14 2245  piperacillin-tazobactam (ZOSYN) IVPB 3.375 g     3.375 g100 mL/hr over 30 Minutes Intravenous  Once 07/06/14 2236 07/06/14 2332        HPI/Subjective: I'm feeling better.  Wants to get N/G out.  Objective: Filed Vitals:   07/09/14 2220 07/10/14 0419 07/10/14 1059 07/10/14 1348  BP: 126/44 172/53 173/66 179/69  Pulse: 75 59 69 71  Temp: 98.1 F (36.7 C) 99 F (37.2 C)  97.7 F (36.5 C)  TempSrc: Oral Oral  Oral  Resp: 24 20  32  Height:      Weight:  76.1 kg (167 lb 12.3 oz)    SpO2: 100% 100%  100%    Intake/Output Summary (Last 24 hours) at 07/10/14 1355 Last data filed at 07/10/14 0900  Gross per 24 hour  Intake   1710 ml  Output   1200 ml  Net    510 ml   Filed Weights   07/08/14 0500 07/09/14 0507 07/10/14 0419  Weight: 75.5 kg (166 lb 7.2 oz) 76.2 kg (167 lb 15.9 oz) 76.1 kg (167 lb 12.3 oz)    Exam: General: obese, elderly female,  NAD, appears stated age.  Lying comfortably in bed.  N/G in place.   HEENT:   EOMI, Anicteic Sclera, MMM. Dried drainage around right eye. Edentulous.   Neck: Supple, thick, no JVD, no masses  Cardiovascular: RRR, S1 S2 auscultated, no rubs, murmurs or gallops.   Respiratory: Clear to auscultation bilaterally with equal chest rise  Abdomen: Obese, less tender to palpation.  Soft distended, Tympanic BS.  Black "water" in rectal bag.  Flexiseal in place. Extremities: warm dry without cyanosis clubbing or edema.  Neuro: AAOx3, cranial nerves grossly intact. Able to move all extremities, speech is clear. Skin: redness across the left face.  No frank rash or bruising. Psych: Normal affect and demeanor with intact judgement and insight   Data Reviewed:  2D Echo Study Conclusions  - Left ventricle:  The cavity size was normal. There was mild concentric hypertrophy. Systolic function was normal. The estimated ejection fraction was in the range of 60% to 65%. Wall motion was normal; there were no regional wall motion abnormalities. - Aortic valve: There was trivial regurgitation. - Left atrium: The atrium was mildly dilated.  Basic Metabolic Panel:  Recent Labs Lab 07/06/14 2143 07/06/14 2153 07/07/14 0100 07/08/14 0815 07/08/14 1855 07/09/14 0625 07/10/14 0522  NA 129* 130* 131* 137  --  135* 134*  K 3.7 3.6* 4.4 3.1*  --  3.1* 4.0  CL 90* 96 95* 103  --  100 100  CO2 19  --  20 22  --  25 23  GLUCOSE 150* 152* 174* 134*  --  92 88  BUN 15 16 19 17   --  8 7  CREATININE 0.84 1.10 0.91 0.76  --  0.53 0.49*  CALCIUM 10.3  --  8.4 8.8  --  8.8 8.7  MG  --   --   --   --  2.1  --   --    Liver Function Tests:  Recent Labs Lab 07/06/14 2143 07/07/14 0100 07/08/14 0815  AST 18 25 14   ALT 11 11 8   ALKPHOS 134* 107 69  BILITOT 0.7 0.9 0.5  PROT 7.4 5.9* 5.6*  ALBUMIN 3.9 3.0* 2.5*    Recent Labs Lab 07/07/14 0100  LIPASE 41   CBC:  Recent Labs Lab 07/06/14 2143 07/06/14 2153 07/07/14 0415 07/08/14 0815 07/09/14 0625 07/10/14 0522  WBC 14.8*  --  7.1 6.8 5.8 5.6  NEUTROABS 12.0*  --   --  5.1  --   --   HGB 15.0 17.0* 13.7 11.8* 10.4* 10.2*  HCT 43.9 50.0* 41.2 35.3* 31.5* 30.7*  MCV 86.9  --  89.4 86.5 86.8 87.5  PLT 280  --  242 263 244 246   Cardiac Enzymes:  Recent Labs Lab 07/06/14 2143 07/07/14 0100 07/07/14 0612 07/08/14 1855 07/09/14 0625  TROPONINI <0.30 <0.30 <0.30 <0.30 <0.30   CBG:  Recent  Labs Lab 07/09/14 2003 07/10/14 0013 07/10/14 0411 07/10/14 0756 07/10/14 1144  GLUCAP 150* 93 94 93 116*    Recent Results (from the past 240 hour(s))  Blood Culture (routine x 2)     Status: None (Preliminary result)   Collection Time: 07/06/14 10:24 PM  Result Value Ref Range Status   Specimen Description BLOOD LEFT HAND  Final     Special Requests BOTTLES DRAWN AEROBIC ONLY 3CC  Final   Culture  Setup Time   Final    07/07/2014 10:18 Performed at [facility]    Culture   Final           BLOOD CULTURE RECEIVED NO GROWTH TO DATE CULTURE WILL BE HELD FOR 5 DAYS BEFORE ISSUING A FINAL NEGATIVE REPORT Performed at [facility]    Report Status PENDING  Incomplete  Blood Culture (routine x 2)     Status: None (Preliminary result)   Collection Time: 07/06/14 10:31 PM  Result Value Ref Range Status   Specimen Description BLOOD RIGHT ANTECUBITAL  Final   Special Requests BOTTLES DRAWN AEROBIC AND ANAEROBIC 5CC EA  Final   Culture  Setup Time   Final    07/07/2014 10:18 Performed at [facility]    Culture   Final    GRAM NEGATIVE RODS Note: Gram Stain Report Called to,Read Back By and Verified With: ANNE LOWE ON 07/10/2014 AT 12:14A BY WILEJ Performed at [facility]    Report Status PENDING  Incomplete  Clostridium Difficile by PCR     Status: None   Collection Time: 07/07/14 12:52 AM  Result Value Ref Range Status   C difficile by pcr NEGATIVE NEGATIVE Final  Urine culture     Status: None   Collection Time: 07/07/14 12:53 AM  Result Value Ref Range Status   Specimen Description URINE, CATHETERIZED  Final   Special Requests URINE, RANDOM  Final   Culture  Setup Time   Final    07/07/2014 11:15 Performed at [facility]    Colony Count NO GROWTH Performed at [facility]   Final   Culture NO GROWTH Performed at [facility]   Final   Report Status 07/08/2014 FINAL  Final  MRSA PCR Screening     Status: Abnormal   Collection Time: 07/07/14  3:43 AM  Result Value Ref Range Status   MRSA by PCR POSITIVE (A) NEGATIVE Final    Comment:        The GeneXpert MRSA Assay (FDA approved for NASAL specimens only), is one component of a comprehensive MRSA colonization surveillance program. It is not intended to diagnose MRSA infection nor to guide  or monitor treatment for MRSA infections. RESULT CALLED TO, READ BACK BY AND VERIFIED WITH: Acadia [place] RN [id number] 07/07/14 MITCHELL,L   Stool culture     Status: None   Collection Time: 07/07/14 12:00 PM  Result Value Ref Range Status   Specimen Description STOOL  Final   Special Requests NONE  Final   Culture   Final    NO SALMONELLA, SHIGELLA, CAMPYLOBACTER, YERSINIA, OR E.COLI [id number] ISOLATED Note: REDUCED NORMAL FLORA PRESENT Performed at [facility]    Report Status 07/10/2014 FINAL  Final  Clostridium Difficile by PCR     Status: None   Collection Time: 07/07/14 12:57 PM  Result Value Ref Range Status   C difficile by pcr NEGATIVE NEGATIVE Final     Studies: Ct Abdomen Pelvis Wo Contrast  07/07/2014   CLINICAL DATA:  Nausea and vomiting. Lower abdominal pain. History of small bowel obstructions.  EXAM: CT ABDOMEN AND PELVIS WITHOUT CONTRAST  TECHNIQUE: Multidetector CT imaging of the abdomen and pelvis was performed following the standard protocol without IV contrast.  COMPARISON:  01/30/2014  FINDINGS: Atelectasis in the lung bases. Coronary artery calcifications. There appears to be an enteric tube with tip in the distal esophagus. Suggest advancement of the stomach for better positioning.  The unenhanced appearance of the liver, spleen, pancreas, kidneys, inferior vena cava, and retroperitoneal lymph nodes is unremarkable. Calcification of the abdominal aorta without aneurysm. Right adrenal gland nodule measures 2.8 cm diameter. Density measurements consistent with fat. 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 No change since prior study. Surgical absence of the gallbladder. Mild bile duct dilatation is likely physiologic. Stomach appears normal. Old thickening in the small bowel suggesting enteritis. No evidence of small bowel dilatation. Colon is diffusely stool-filled with evidence of wall thickening in the transverse and descending portions. Mild pericolonic infiltration. This suggests  colitis, likely of infectious or inflammatory etiology. No free air or free fluid in the abdomen.  Pelvis: Foley catheter in the bladder. Thickening of the wall of the rectosigmoid colon with pericolonic infiltration consistent with proctitis and colitis. There appears to be a balloon catheter in the rectum. Small amount of free fluid in the pelvis is probably reactive. Appendix is not identified. No pelvic mass or lymphadenopathy. Degenerative changes in the lumbar spine. No destructive bone lesions appreciated.  IMPRESSION: Wall thickening in the transverse, descending, and rectosigmoid colon consistent with nonspecific colitis, likely infectious or inflammatory. Small bowel fold thickening suggest enteritis. No evidence of small bowel obstruction. Enteric tube tip is in the distal esophagus and should be advanced if placement in the stomach is desired. Foley and rectal catheters are present.   Electronically Signed   By: Burman Nieves M.D.   On: 07/07/2014 03:29   Ct Head Wo Contrast  07/06/2014   CLINICAL DATA:  Found down, last seen normal at 2030 hr, altered mental status, LEFT-sided weakness and slurred speech.  EXAM: CT HEAD WITHOUT CONTRAST  TECHNIQUE: Contiguous axial images were obtained from the base of the skull through the vertex without intravenous contrast.  COMPARISON:  CT of the head March 01, 2010  FINDINGS: Mild motion degraded examination.  The ventricles and sulci are normal for age. No intraparenchymal hemorrhage, mass effect nor midline shift. Patchy supratentorial and pontine white matter hypodensities are within normal range for patient's age and though non-specific suggest sequelae of chronic small vessel ischemic disease. No acute large vascular territory infarcts.  No abnormal extra-axial fluid collections. Basal cisterns are patent. Moderate calcific atherosclerosis of the carotid siphons and included vertebra arteries.  No skull fracture. The included ocular globes and orbital  contents are non-suspicious. Status post bilateral ocular lens implants. RIGHT sphenoid mucosal retention cyst without paranasal sinus air-fluid levels. Patient is edentulous. Soft tissue within the external auditory canals most consistent with cerumen. 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  IMPRESSION: No acute intracranial process on this mildly motion degraded examination.  Moderate to severe white matter changes suggest chronic small vessel ischemic disease. If clinical concern persists for acute ischemia, MRI of the brain with diffusion-weighted sequences would be more sensitive.  Acute findings discussed with and reconfirmed by Dr.Kirkpatrick on 07/06/2014 at 9:55 pm.   Electronically Signed   By: Awilda Metro   On: 07/06/2014 22:05   Dg Chest Portable 1 View  07/07/2014  CLINICAL DATA:  Central line placement.  EXAM: PORTABLE CHEST - 1 VIEW  COMPARISON:  Chest radiograph July 06, 2014  FINDINGS: Interval placement of LEFT subclavian central venous catheter with distal tip projecting in mid superior vena cava. No pneumothorax.  The cardiac silhouette is upper limits of normal in size. Mediastinal silhouette is nonsuspicious, mildly calcified aortic knob. Strandy density persists in LEFT lung base. No pleural effusion. RIGHT humeral arthroplasty. Severe degenerative change of the LEFT shoulder, LEFT distal clavicle osteotomy.  IMPRESSION: Interval placement of LEFT subclavian central venous catheter, distal tip projecting in mid superior vena cava. No pneumothorax.  Stable cardiomegaly and LEFT lung base atelectasis.   Electronically Signed   By: Awilda Metroourtnay  Bloomer   On: 07/07/2014 01:17   Dg Chest Port 1 View  07/06/2014   CLINICAL DATA:  Hypotension, history of COPD, hypertension, shortness of breath and pneumonia.  EXAM: PORTABLE CHEST - 1 VIEW  COMPARISON:  Chest radiograph January 30, 2014  FINDINGS: Cardiac silhouette is upper limits of normal in size. Tortuous, mildly  calcified aorta. Strandy densities LEFT lung base. No pleural effusion or focal consolidation. No pneumothorax. Edit  Coil densities in the neck are unchanged, and may be vascular. Status post RIGHT humeral arthroplasty. Bilateral distal clavicle resection, severe degenerative change LEFT shoulder.  IMPRESSION: Borderline cardiomegaly. LEFT lung base atelectasis versus scarring.   Electronically Signed   By: Awilda Metroourtnay  Bloomer   On: 07/06/2014 22:51   Dg Abd Portable 1v  07/07/2014   CLINICAL DATA:  Abdominal pain.  NG tube placement.  EXAM: PORTABLE ABDOMEN - 1 VIEW  COMPARISON:  01/31/2014  FINDINGS: The enteric tube tip projects over the EG junction region with proximal side hole projected over the lower esophagus. Appearance consistent with placement in the distal esophagus. Residual contrast material in the stomach and small bowel.  IMPRESSION: Enteric tube tip projects over the distal esophagus. Advancement to the stomach is recommended for better placement.   Electronically Signed   By: Burman NievesWilliam  Stevens M.D.   On: 07/07/2014 02:52    Scheduled Meds: . antiseptic oral rinse  7 mL Mouth Rinse BID  . benazepril  20 mg Oral BID  . ceFEPime (MAXIPIME) IV  2 g Intravenous Q12H  . Chlorhexidine Gluconate Cloth  6 each Topical Q0600  . DULoxetine  60 mg Oral Daily  . famotidine (PEPCID) IV  20 mg Intravenous Q24H  . insulin aspart  0-9 Units Subcutaneous 6 times per day  . labetalol  200 mg Oral BID  . metronidazole  500 mg Intravenous Q8H  . mupirocin ointment  1 application Nasal BID  . oxybutynin  10 mg Oral Daily   Continuous Infusions: . sodium chloride 0.9 % 1,000 mL with potassium chloride 40 mEq infusion 50 mL/hr at 07/10/14 1245    Active Problems:   Septic shock   Dysarthria   Abdominal pain   Central line insertion site infection   Hypotension   Adynamic ileus   Colitis   Ileus   Tachycardia, paroxysmal   Paroxysmal SVT (supraventricular tachycardia)   Essential  hypertension    Conley CanalYork, Marianne L, PA-C  [facility] Pager [phone]. If 7PM-7AM, please contact night-coverage at [email or web address], password [email or web address] 07/10/2014, 1:55 PM  LOS: 4 days    Patient seen and examined.  Feeling better, no more nausea or vomiting.  Still with diarrhea, denies worsening abdominal pain.  Day 4 antibiotics. Follow culture results.  Belkys Regalado, Md.

## 2014-07-10 NOTE — Consult Note (Signed)
[place] Reason for consult: Diarrhea, abnormal CT suggesting colitis transverse descending and sigmoid colon, gram-negative sepsis Referring Physician: [facility]. PCP is in [place] is an 73 y.o. female.  HPI: she was admitted to the hospital after her caretaker called because of syncope. The patient reports that she has a caretaker about 5 hours a day but is been able to get along in her apartment by herself the rest of the day. The 1st thing that she recalls his diarrhea and feeling terrible. She had accidents and apparently stool in her living room. She does not recall blood in the bowel movement. She had multiple stools felt weak and probably had a syncopal episode. She had diarrhea nausea and cramping but not severe abdominal pain. She presented with these symptoms as well as neurological changes. It was felt that her neurological changes may have been the result of dehydration. She had no acute intracranial process. Fortunately, her neurological symptoms appear to have resolved. She has been followed by neurology and it's felt that she may have a small TIA due to the hypotension. The patient has had a flexiseal in place but this came out and she is not have any more diarrhea. Her workup is included stool cultures which are negative for enteric pathogens and for C. difficile. Her blood culture was positive for gram-negative rods. I'm unable to find the final ID at this time. She has had previous cholecystectomy bladder tact and hysterectomy. The patient is never had a colonoscopy. It is been recommended to her on several occasions but she has declined because family members and friends told her that colonoscopies were dangerous and could result in perforation and emergency surgery. CT scan suggested thickene colon: in the transverse descending colon with stools still in colon. Patient denies any abdominal pain at the present time.  Past Medical History   Diagnosis Date  . GERD (gastroesophageal reflux disease)     takes Protonix daily  . Urinary frequency     takes Ditropan daily  . Depression     takes Cymbalta daily  . Anxiety     takes Valium daily  . COPD (chronic obstructive pulmonary disease)     uses ALbuterol prn daily  . Hypertension     takes Amlodipine,Benazepril,and Labetalol daily  . Shortness of breath     with exertion  . History of bronchitis     last time in 1976  . Pneumonia     hx of-many yrs ago  . Headache(784.0)     occasionally  . Numbness     both feet  . Arthritis   . Joint pain   . Peripheral edema     but not on any meds  . Chronic back pain     herniated disc and stenosis  . Carpal tunnel syndrome   . H/O hiatal hernia   . History of gastric ulcer   . Urinary urgency   . History of kidney stones   . Asthma   . COPD (chronic obstructive pulmonary disease)   . Bowel obstruction 07/08/2014  . Diabetes mellitus without complication     Past Surgical History  Procedure Laterality Date  . Cholecystectomy    . Abdominal hysterectomy    . Bladder tacked    . Shoulder surgery Bilateral   . Joint replacement Bilateral     knee   . Cataract surgery Bilateral   . Epidural block injection    . Esophagogastroduodenoscopy    .  Lumbar laminectomy/decompression microdiscectomy N/A 10/02/2013    Procedure: LUMBAR ONE-TWO LAMINECTOMY/DECOMPRESSION MICRODISCECTOMY ;  Surgeon: Kristeen Miss, MD;  Location: [facility];  Service: Neurosurgery;  Laterality: N/A;  L1-2 Laminectomy  . Eye surgery      cataracts  . Spine surgery      Family History  Problem Relation Age of Onset  . Diabetes Mother   . Hypertension Mother   . Arthritis Father   . Heart disease Father   . Arthritis Brother   . Early death Son   . Arthritis Brother   . Arthritis Brother     Social History:  reports that she has been smoking.  She has never used smokeless tobacco. She reports that she does not drink alcohol or use  illicit drugs.  Allergies:  Allergies  Allergen Reactions  . Adhesive [Tape]     Itching and redness around site;Pt requests paper tape  . Cephalexin Nausea And Vomiting    Medications; Prior to Admission medications   Medication Sig Start Date End Date Taking? Authorizing Provider  albuterol (PROVENTIL HFA;VENTOLIN HFA) 108 (90 BASE) MCG/ACT inhaler Inhale 2 puffs into the lungs every 4 (four) hours as needed for wheezing or shortness of breath.   Yes Historical Provider, MD  albuterol (PROVENTIL) (2.5 MG/3ML) 0.083% nebulizer solution Take 2.5 mg by nebulization 4 (four) times daily as needed for wheezing or shortness of breath.   Yes Historical Provider, MD  benazepril (LOTENSIN) 20 MG tablet Take 20 mg by mouth 2 (two) times daily.   Yes Historical Provider, MD  Cyanocobalamin (VITAMIN B 12 PO) Take 1 tablet by mouth daily.   Yes Historical Provider, MD  diazepam (VALIUM) 5 MG tablet Take 5 mg by mouth 2 (two) times daily.   Yes Historical Provider, MD  DULoxetine (CYMBALTA) 60 MG capsule Take 60 mg by mouth daily.   Yes Historical Provider, MD  hydrochlorothiazide (MICROZIDE) 12.5 MG capsule Take 12.5 mg by mouth daily.   Yes Historical Provider, MD  labetalol (NORMODYNE) 200 MG tablet Take 400 mg by mouth 2 (two) times daily.    Yes Historical Provider, MD  meclizine (ANTIVERT) 12.5 MG tablet Take 12.5 mg by mouth 2 (two) times daily as needed for dizziness.   Yes Historical Provider, MD  oxybutynin (DITROPAN) 5 MG tablet Take 10 mg by mouth daily.   Yes Historical Provider, MD  pantoprazole (PROTONIX) 40 MG tablet Take 40 mg by mouth daily.   Yes Historical Provider, MD  tiZANidine (ZANAFLEX) 4 MG tablet Take 4 mg by mouth 3 (three) times daily. 12/28/13  Yes Historical Provider, MD  VITAMIN D, CHOLECALCIFEROL, PO Take 1 tablet by mouth daily.   Yes Historical Provider, MD   . antiseptic oral rinse  7 mL Mouth Rinse BID  . ceFEPime (MAXIPIME) IV  2 g Intravenous Q12H  .  Chlorhexidine Gluconate Cloth  6 each Topical Q0600  . DULoxetine  60 mg Oral Daily  . famotidine (PEPCID) IV  20 mg Intravenous Q24H  . insulin aspart  0-9 Units Subcutaneous 6 times per day  . labetalol  200 mg Oral BID  . metronidazole  500 mg Intravenous Q8H  . mupirocin ointment  1 application Nasal BID  . oxybutynin  10 mg Oral Daily   PRN Meds albuterol, LORazepam, ondansetron (ZOFRAN) IV, sodium chloride Results for orders placed or performed during the hospital encounter of 07/06/14 (from the past 48 hour(s))  Glucose, capillary     Status: Abnormal   Collection  Time: 07/08/14 12:03 PM  Result Value Ref Range   Glucose-Capillary 160 (H) 70 - 99 mg/dL  Glucose, capillary     Status: Abnormal   Collection Time: 07/08/14  4:17 PM  Result Value Ref Range   Glucose-Capillary 177 (H) 70 - 99 mg/dL  Lactic acid, plasma     Status: None   Collection Time: 07/08/14  6:00 PM  Result Value Ref Range   Lactic Acid, Venous 1.9 0.5 - 2.2 mmol/L  Magnesium     Status: None   Collection Time: 07/08/14  6:55 PM  Result Value Ref Range   Magnesium 2.1 1.5 - 2.5 mg/dL  Troponin I (q 6hr x 3)     Status: None   Collection Time: 07/08/14  6:55 PM  Result Value Ref Range   Troponin I <0.30 <0.30 ng/mL    Comment:        Due to the release kinetics of cTnI, a negative result within the first hours of the onset of symptoms does not rule out myocardial infarction with certainty. If myocardial infarction is still suspected, repeat the test at appropriate intervals.   Glucose, capillary     Status: None   Collection Time: 07/08/14  8:06 PM  Result Value Ref Range   Glucose-Capillary 95 70 - 99 mg/dL   Comment 1 Documented in Chart    Comment 2 Notify RN   Glucose, capillary     Status: Abnormal   Collection Time: 07/09/14 12:42 AM  Result Value Ref Range   Glucose-Capillary 105 (H) 70 - 99 mg/dL  Glucose, capillary     Status: Abnormal   Collection Time: 07/09/14  3:14 AM  Result  Value Ref Range   Glucose-Capillary 102 (H) 70 - 99 mg/dL   Comment 1 Notify RN    Comment 2 Documented in Chart   Troponin I (q 6hr x 3)     Status: None   Collection Time: 07/09/14  6:25 AM  Result Value Ref Range   Troponin I <0.30 <0.30 ng/mL    Comment:        Due to the release kinetics of cTnI, a negative result within the first hours of the onset of symptoms does not rule out myocardial infarction with certainty. If myocardial infarction is still suspected, repeat the test at appropriate intervals.   CBC     Status: Abnormal   Collection Time: 07/09/14  6:25 AM  Result Value Ref Range   WBC 5.8 4.0 - 10.5 K/uL   RBC 3.63 (L) 3.87 - 5.11 MIL/uL   Hemoglobin 10.4 (L) 12.0 - 15.0 g/dL   HCT 31.5 (L) 36.0 - 46.0 %   MCV 86.8 78.0 - 100.0 fL   MCH 28.7 26.0 - 34.0 pg   MCHC 33.0 30.0 - 36.0 g/dL   RDW 13.9 11.5 - 15.5 %   Platelets 244 150 - 400 K/uL  Basic metabolic panel     Status: Abnormal   Collection Time: 07/09/14  6:25 AM  Result Value Ref Range   Sodium 135 (L) 137 - 147 mEq/L   Potassium 3.1 (L) 3.7 - 5.3 mEq/L   Chloride 100 96 - 112 mEq/L   CO2 25 19 - 32 mEq/L   Glucose, Bld 92 70 - 99 mg/dL   BUN 8 6 - 23 mg/dL   Creatinine, Ser 0.53 0.50 - 1.10 mg/dL   Calcium 8.8 8.4 - 10.5 mg/dL   GFR calc non Af Amer >90 >90 mL/min  GFR calc Af Amer >90 >90 mL/min    Comment: (NOTE) The eGFR has been calculated using the CKD EPI equation. This calculation has not been validated in all clinical situations. eGFR's persistently <90 mL/min signify possible Chronic Kidney Disease.    Anion gap 10 5 - 15  Glucose, capillary     Status: None   Collection Time: 07/09/14  7:49 AM  Result Value Ref Range   Glucose-Capillary 92 70 - 99 mg/dL  Glucose, capillary     Status: None   Collection Time: 07/09/14 11:32 AM  Result Value Ref Range   Glucose-Capillary 94 70 - 99 mg/dL  Lipid panel     Status: Abnormal   Collection Time: 07/09/14  5:00 PM  Result Value Ref  Range   Cholesterol 81 0 - 200 mg/dL   Triglycerides 99 <150 mg/dL   HDL 29 (L) >39 mg/dL   Total CHOL/HDL Ratio 2.8 RATIO   VLDL 20 0 - 40 mg/dL   LDL Cholesterol 32 0 - 99 mg/dL    Comment:        Total Cholesterol/HDL:CHD Risk Coronary Heart Disease Risk Table                     Men   Women  1/2 Average Risk   3.4   3.3  Average Risk       5.0   4.4  2 X Average Risk   9.6   7.1  3 X Average Risk  23.4   11.0        Use the calculated Patient Ratio above and the CHD Risk Table to determine the patient's CHD Risk.        ATP III CLASSIFICATION (LDL):  <100     mg/dL   Optimal  100-129  mg/dL   Near or Above                    Optimal  130-159  mg/dL   Borderline  160-189  mg/dL   High  >190     mg/dL   Very High   Glucose, capillary     Status: None   Collection Time: 07/09/14  5:22 PM  Result Value Ref Range   Glucose-Capillary 95 70 - 99 mg/dL  Hemoglobin A1c     Status: None   Collection Time: 07/09/14  5:50 PM  Result Value Ref Range   Hgb A1c MFr Bld 5.6 <5.7 %    Comment: (NOTE)                                                                       According to the ADA Clinical Practice Recommendations for 2011, when HbA1c is used as a screening test:  >=6.5%   Diagnostic of Diabetes Mellitus           (if abnormal result is confirmed) 5.7-6.4%   Increased risk of developing Diabetes Mellitus References:Diagnosis and Classification of Diabetes Mellitus,Diabetes JZPH,1505,69(VXYIA 1):S62-S69 and Standards of Medical Care in         Diabetes - 2011,Diabetes Care,2011,34 (Suppl 1):S11-S61.    Mean Plasma Glucose 114 <117 mg/dL    Comment: Performed at [facility]  Glucose, capillary  Status: Abnormal   Collection Time: 07/09/14  8:03 PM  Result Value Ref Range   Glucose-Capillary 150 (H) 70 - 99 mg/dL   Comment 1 Documented in Chart    Comment 2 Notify RN   Glucose, capillary     Status: None   Collection Time: 07/10/14 12:13 AM  Result Value  Ref Range   Glucose-Capillary 93 70 - 99 mg/dL   Comment 1 Documented in Chart    Comment 2 Notify RN   Glucose, capillary     Status: None   Collection Time: 07/10/14  4:11 AM  Result Value Ref Range   Glucose-Capillary 94 70 - 99 mg/dL  CBC     Status: Abnormal   Collection Time: 07/10/14  5:22 AM  Result Value Ref Range   WBC 5.6 4.0 - 10.5 K/uL   RBC 3.51 (L) 3.87 - 5.11 MIL/uL   Hemoglobin 10.2 (L) 12.0 - 15.0 g/dL   HCT 30.7 (L) 36.0 - 46.0 %   MCV 87.5 78.0 - 100.0 fL   MCH 29.1 26.0 - 34.0 pg   MCHC 33.2 30.0 - 36.0 g/dL   RDW 14.0 11.5 - 15.5 %   Platelets 246 150 - 400 K/uL  Basic metabolic panel     Status: Abnormal   Collection Time: 07/10/14  5:22 AM  Result Value Ref Range   Sodium 134 (L) 137 - 147 mEq/L   Potassium 4.0 3.7 - 5.3 mEq/L    Comment: DELTA CHECK NOTED   Chloride 100 96 - 112 mEq/L   CO2 23 19 - 32 mEq/L   Glucose, Bld 88 70 - 99 mg/dL   BUN 7 6 - 23 mg/dL   Creatinine, Ser 0.49 (L) 0.50 - 1.10 mg/dL   Calcium 8.7 8.4 - 10.5 mg/dL   GFR calc non Af Amer >90 >90 mL/min   GFR calc Af Amer >90 >90 mL/min    Comment: (NOTE) The eGFR has been calculated using the CKD EPI equation. This calculation has not been validated in all clinical situations. eGFR's persistently <90 mL/min signify possible Chronic Kidney Disease.    Anion gap 11 5 - 15  Glucose, capillary     Status: None   Collection Time: 07/10/14  7:56 AM  Result Value Ref Range   Glucose-Capillary 93 70 - 99 mg/dL    Ct Angio Head W/cm &/or Wo Cm  07/08/2014   CLINICAL DATA:  Code stroke with LEFT brain TIA, 07/06/2014, now resolved. Slight headache.  EXAM: CT ANGIOGRAPHY HEAD  TECHNIQUE: Multidetector CT imaging of the head was performed using the standard protocol during bolus administration of intravenous contrast. Multiplanar CT image reconstructions and MIPs were obtained to evaluate the vascular anatomy.  CONTRAST:  50m OMNIPAQUE IOHEXOL 350 MG/ML SOLN  COMPARISON:  CT head  performed 07/06/2014.  FINDINGS: The RIGHT internal carotid artery displays wide patency in its upper cervical and segment. There is a 50% inferior cavernous stenosis which is calcific. Mild supraclinoid calcification also estimated 50% stenosis.  On the LEFT, there is a 50% distal cervical calcific ICA stenosis. There is also mild non stenotic cavernous and supraclinoid atherosclerotic calcification.  The basilar is widely patent with both vertebrals contributing, LEFT slightly larger. Minimal distal LEFT vertebral calcification, non stenotic.  There is no proximal stenosis of the anterior, middle, or posterior cerebral arteries no MCA or PCA branch occlusion. No cerebellar branch occlusion. Generalized atrophy with hypoattenuation white matter representing small vessel disease is redemonstrated. No abnormal postcontrast enhancement.  Major dural venous sinuses are patent.  Review of the MIP images confirms the above findings.  IMPRESSION: No flow reducing lesion of the intracranial circulation is evident.   Electronically Signed   By: Rolla Flatten M.D.   On: 07/08/2014 15:27   Dg Abd 1 View  07/10/2014   CLINICAL DATA:  Ileus.  Abdominal distention.  EXAM: ABDOMEN - 1 VIEW  COMPARISON:  07/09/2014 and 07/08/2014  FINDINGS: There is increased gaseous distention of colon. However, the contrast from prior CT scan appears to have passed. There are no dilated small bowel loops. No visible free air or free fluid on this supine radiograph.  IMPRESSION: Increased gaseous distention of the colon to a diameter of 11 cm at the hepatic flexure. The finding is consistent with progressive ileus.   Electronically Signed   By: Rozetta Nunnery M.D.   On: 07/10/2014 08:02   Dg Abd 1 View  07/08/2014   CLINICAL DATA:  Abdominal pain and vomiting. Evaluate nasogastric tube. Colitis.  EXAM: ABDOMEN - 1 VIEW  COMPARISON:  07/07/2014  FINDINGS: Nasogastric tube in the upper stomach region. There is oral contrast in the right colon.  Right colon is dilated but similar to the recent CT. Few dilated loops of small bowel. Evidence for a rectal catheter. Mild scoliosis in lumbar spine with multilevel disc disease.  IMPRESSION: Nasogastric tube in the upper stomach region.  The oral contrast has advanced into the right colon. There continues to be dilatation of the right colon. Dilatation is probably secondary to the wall thickening in the left colon and colitis. Findings could represent at least a partial colonic obstruction.   Electronically Signed   By: Markus Daft M.D.   On: 07/08/2014 14:39   Dg Abd 2 Views  07/09/2014   CLINICAL DATA:  Abdomen pain and distention. Check NG tube and follow progression of colonic contrast. History of colitis  EXAM: ABDOMEN - 2 VIEW  COMPARISON:  the previous day's study  FINDINGS: Nasogastric tube extends to the gastric fundus. Interval clearance of most of the previously noted colonic contrast material, a small residual in the proximal descending segment. There a few gas distended small bowel loops. Multiple fluid levels in the small bowel and proximal colon on decubitus radiographs indicating liquid content. No free air.  Spondylitic changes throughout the lumbar spine. Bilateral pelvic vascular calcifications.  IMPRESSION: 1. Fluid levels in small bowel and proximal colon suggesting adynamic ileus.   Electronically Signed   By: Arne Cleveland M.D.   On: 07/09/2014 11:23               Blood pressure 173/66, pulse 69, temperature 99 F (37.2 C), temperature source Oral, resp. rate 20, height 4' 11"  (1.499 m), weight 76.1 kg (167 lb 12.3 oz), SpO2 100 %.  Physical exam:   General--obese white female appears frail but in no acute distress ENT-- normocephalic with no evidence of head trauma. Extraocular movements intact. Sclera nonicteric. Neck-- no lymphadenopathy Heart-- regular rate and rhythm without murmurs are gallops  Lungs-- grossly clear  Abdomen-- nondistended soft with good  bowel sounds completely nontender.  Psych-- alert and oriented and gives good history    Assessment: 1. Acute colitis with diarrhea. The distribution of inflammation on CT scan could well indicate ischemic colitis. Her abdomen is not tender at the current time. She does appear to have a colonic ileus but is not particularly that distended on exam. I think she would be a good candidate for  sigmoidoscopy and she is willing to go ahead with that. This would allow [place] to evaluate the colonic mucosa for signs of ischemic colitis. Her stool cultures are negative for enteric pathogens. 2. Syncopal episodes. This could of been a result of dehydration hypotension from the diarrhea. This could well lead to a TIA as well as to ischemic colitis.    Plan: 1. Would go ahead and keep her own clear liquids for now and try to keep her potassium 4.5 to 5.0. 2. We will go ahead and plan for sigmoidoscopy tomorrow morning. The patient is willing to do this and this would hopefully allow [place] to determine if she has ischemic colitis. We'll give her Order enemas in the morning. 3. Continue IV antibiotics for her gram-negative sepsis.   Brittane Grudzinski JR,Siyon Linck L 07/10/2014, 11:45 AM

## 2014-07-10 NOTE — Progress Notes (Signed)
MD notified due to acute change in patient status. Vital signs and labs are listed below.  MD notified(1st page) Time of 1st page:  1445 Responding MD:  Algis DownsMarianne York, PA Time MD responded: 1447 MD response: MD stated aware  Vital Signs Filed Vitals:   07/10/14 0419 07/10/14 1059 07/10/14 1348 07/10/14 1421  BP: 172/53 173/66 179/69   Pulse: 59 69 71   Temp: 99 F (37.2 C)  97.7 F (36.5 C)   TempSrc: Oral  Oral   Resp: 20  32 24  Height:      Weight: 76.1 kg (167 lb 12.3 oz)     SpO2: 100%  100%      Lab Results WBC  Date/Time Value Ref Range Status  07/10/2014 05:22 AM 5.6 4.0 - 10.5 K/uL Final  07/09/2014 06:25 AM 5.8 4.0 - 10.5 K/uL Final  07/08/2014 08:15 AM 6.8 4.0 - 10.5 K/uL Final   NEUTROPHILS RELATIVE %  Date/Time Value Ref Range Status  07/08/2014 08:15 AM 76 43 - 77 % Final  07/06/2014 09:43 PM 81* 43 - 77 % Final  01/30/2014 12:30 PM 79* 43 - 77 % Final   PCO2 ARTERIAL  Date/Time Value Ref Range Status  03/04/2010 05:25 AM 41.0 35.0 - 45.0 mmHg Final  03/03/2010 05:20 AM 43.4 35.0 - 45.0 mmHg Final  03/02/2010 03:52 PM 43.8 35.0 - 45.0 mmHg Final   LACTIC ACID, VENOUS  Date/Time Value Ref Range Status  07/08/2014 06:00 PM 1.9 0.5 - 2.2 mmol/L Final  07/07/2014 10:00 AM 1.6 0.5 - 2.2 mmol/L Final  07/07/2014 04:15 AM 2.1 0.5 - 2.2 mmol/L Final   No results found for: PCO2VEN   Duanne LimerickWesselink, Zondra Lawlor Garret, RN 07/10/2014, 2:27 PM

## 2014-07-11 ENCOUNTER — Encounter (HOSPITAL_COMMUNITY)
Admission: EM | Disposition: A | Discharge status: Skilled Nursing Facility | Payer: Self-pay | Source: Home / Self Care | Attending: Internal Medicine

## 2014-07-11 ENCOUNTER — Encounter (HOSPITAL_COMMUNITY): Payer: Self-pay | Admitting: *Deleted

## 2014-07-11 HISTORY — PX: FLEXIBLE SIGMOIDOSCOPY: SHX5431

## 2014-07-11 LAB — BASIC METABOLIC PANEL
Anion gap: 9 (ref 5–15)
BUN: 5 mg/dL — ABNORMAL LOW (ref 6–23)
CO2: 24 mEq/L (ref 19–32)
CREATININE: 0.43 mg/dL — AB (ref 0.50–1.10)
Calcium: 8.8 mg/dL (ref 8.4–10.5)
Chloride: 97 mEq/L (ref 96–112)
GFR calc non Af Amer: >90 mL/min (ref >90)
Glucose, Bld: 96 mg/dL (ref 70–99)
Potassium: 3.9 mEq/L (ref 3.7–5.3)
Sodium: 130 mEq/L — ABNORMAL LOW (ref 137–147)

## 2014-07-11 LAB — GLUCOSE, CAPILLARY
GLUCOSE-CAPILLARY: 118 mg/dL — AB (ref 70–99)
Glucose-Capillary: 116 mg/dL — ABNORMAL HIGH (ref 70–99)
Glucose-Capillary: 84 mg/dL (ref 70–99)
Glucose-Capillary: 93 mg/dL (ref 70–99)
Glucose-Capillary: 94 mg/dL (ref 70–99)
Glucose-Capillary: 97 mg/dL (ref 70–99)

## 2014-07-11 SURGERY — SIGMOIDOSCOPY, FLEXIBLE
Anesthesia: Moderate Sedation

## 2014-07-11 MED ORDER — PANTOPRAZOLE SODIUM 40 MG PO TBEC
40.0000 mg | DELAYED_RELEASE_TABLET | Freq: Every day | ORAL | Status: DC
Start: 2014-07-11 — End: 2014-07-14
  Administered 2014-07-11 – 2014-07-14 (×4): 40 mg via ORAL
  Filled 2014-07-11 (×4): qty 1

## 2014-07-11 MED ORDER — HYDROCHLOROTHIAZIDE 12.5 MG PO CAPS
12.5000 mg | ORAL_CAPSULE | Freq: Every day | ORAL | Status: DC
  Administered 2014-07-11: 12.5 mg via ORAL
  Filled 2014-07-11: qty 1

## 2014-07-11 MED ORDER — FENTANYL CITRATE 0.05 MG/ML IJ SOLN
INTRAMUSCULAR | Status: AC
  Filled 2014-07-11: qty 2

## 2014-07-11 MED ORDER — FENTANYL CITRATE 0.05 MG/ML IJ SOLN
INTRAMUSCULAR | Status: DC | PRN
  Administered 2014-07-11: 25 ug via INTRAVENOUS

## 2014-07-11 MED ORDER — LABETALOL HCL 300 MG PO TABS
300.0000 mg | ORAL_TABLET | Freq: Two times a day (BID) | ORAL | Status: DC
  Administered 2014-07-11 – 2014-07-12 (×3): 300 mg via ORAL
  Filled 2014-07-11 (×4): qty 1

## 2014-07-11 MED ORDER — HYDRALAZINE HCL 20 MG/ML IJ SOLN
10.0000 mg | Freq: Four times a day (QID) | INTRAMUSCULAR | Status: DC | PRN
  Administered 2014-07-14: 10 mg via INTRAVENOUS
  Filled 2014-07-11: qty 1

## 2014-07-11 MED ORDER — ASPIRIN 81 MG PO CHEW
81.0000 mg | CHEWABLE_TABLET | Freq: Every day | ORAL | Status: DC
  Administered 2014-07-11 – 2014-07-14 (×4): 81 mg via ORAL
  Filled 2014-07-11 (×4): qty 1

## 2014-07-11 MED ORDER — MIDAZOLAM HCL 10 MG/2ML IJ SOLN
INTRAMUSCULAR | Status: DC | PRN
  Administered 2014-07-11 (×2): 2 mg via INTRAVENOUS

## 2014-07-11 MED ORDER — MIDAZOLAM HCL 5 MG/ML IJ SOLN
INTRAMUSCULAR | Status: AC
  Filled 2014-07-11: qty 2

## 2014-07-11 MED ORDER — SODIUM CHLORIDE 0.9 % IV SOLN: INTRAVENOUS | Status: DC

## 2014-07-11 NOTE — Progress Notes (Signed)
PROGRESS NOTE  Hartford PoliZula L Ambrosino KVQ:[id number] DOB: February 21, 1941 DOA: 07/06/2014 PCP: Frazier RichardsIXON,MARY BETH, PA-C  73 yo female with pmh that includes COPD, SBO, gastric ulcer reports her illness came on suddenly.  She began having simultaneous vomiting and diarrhea and subsequently passed out.  She was found with facial droop and brought in as a code stroke.  The code stroke was cancelled when her CT head showed no acute changes.  She denies feeling poorly recently (prior to her sudden vomiting) and states that she knows of no sick contacts.  She was admitting to the ICU and required pressors.  CT Abdomen showed thickening in the transverse, descending and rectosigmoid colon.    Assessment/Plan:  Distributive Shock Resolved.  Patient's BP was 62/49 on admission.  Hypotensive, Hypovolemic and infectious.  One of 2 blood cultures is positive for gram-negative rods.  Urine culture is negative.  C-diff negative x 2.  Stool culture is negative.  Was initially treated with IV and Oral Vanc, Cefepime, and flagyl.  Antibiotics have been narrowed to cefepime and flagyl.   she was initially admitted to the ICU and required norepinephrine.  Now shock has resolved and patient is stable on the floor.  BP medications are  being added back as pulse and BP will tolerate.     Colitis Felt to be ischemic due to low flow state.  C-diff has been ruled out - oral vanc discontinued.  Stool culture negative. Rectal bag fell out and was left out purposefully.  [facility] consulted and preformed flexible sigmoidoscopy.  Biopsies were taken.  Follow up on path results.  Gram negative bacteremia Patient is positive in one of 2 blood cultures for gram-negative rods. Sensitivities are still pending. She is currently receiving IV cefepime and Flagyl.  We believe the source may be translocation from the gut.  Will check culture on central line cath tip as well.   Syncope.  Probable TIA CT head negative.    CTA head is  negative.  Patient is unable to lay flat for an MRI. 2D echo completed - normal.  Results below.  Carotid Dopplers pending Has been seen by stroke team.  Dr. Pearlean BrownieSethi believes she had a TIA secondary to low blood flow.  He recommends 81 mg aspirin daily.  Started aspirin 12/17. Monitor hb.   Inferior ST segment elevation on EKG w/o reciprocal changes. Resolved on subsequent EKG.  2D echo is normal.  Troponin cycled and are negative. Patient without chest pain.  Vomiting / Diarrhea Patient improving. NG removed December 15. Flexiseal removed December 16.  Patient tolerating a clear liquid diet will advance to full liquids.  Strict I's and O's.   Adynamic Ileus (12/15); improving clinically, tolerating clear diet.  Patient's bowel sounds are tympanic.  repeat x-ray 12/16 does not show improvement. Appreciate GI consultation.  Increase K++ to 4.5 - 5.0.  Mobilize patient.  Eliminate narcotics and anticholinergics.  Normocytic Anemia Hgb has trended down while the patient was in the hospital, but appears to have stabilized at approximately 10.  Guiac stool.  Continue to monitor.  Aspirin started 12/17 as hgb remains stable.   Hypokalemia Secondary to GI losses.  Replete with IVF.  Mg replaced.   Amphetamine Positive  On tox screen.  ??  Hypertension On Benazepril and Labetalol at home.  These were held initially  due to hypotensive shock.  Patient had nonsustained Vtach and cardiology was consulted.  Labetalol was restarted at low dose and runs of vtach  subsided.  Patient's BP and pulse rate are rising.  Will continue to add back blood pressure medications as patient improves. (Added back Lotensin 20 mg twice a day on 12/16).  D/C'ing HCTZ due to decrease in sodium.  Will add prn hydralazine for SBP > 180.  Chronic pain - bilateral shoulders. Reports she is on oxy at home, but this is not on her med list.  She does not appear to be on any pain medication in house. Will add Tylenol.    Mild  Hyponatremia (12/17) Secondary to diarrhea? HCTZ?  Will stop HCTZ and monitor.  DVT Prophylaxis:  SCDs   Code Status: DNR Family Communication: Patient alert and orientated. Disposition Plan: inpatient.  PT recommends SNF rehab.   SIGNIFICANT EVENTS / STUDIES:  1. 07/06/14 - EMS --> ED after found down with facial droop  2. 07/06/14 - CT head shows chronic small vessel disease but no acute process - code stroke canceled 3. 07/06/14 - EKG with ST segment elevation in inferior leads - code STEMI canceled by cardiology 4. 07/06/14 - Norepi started for shock, CCM called for admission  LINES / TUBES: 1. PIVs 07/06/14 >> 2. Foley 07/06/14 >>12/15 3. Left subclavian CVC 07/06/14 >>12/16 (check culture of tip) 4. N/G d/c'd 12/15. 5. Rectal dc'd 12/16  CULTURES: 1. 07/06/14 blood cx x2 >> 1 of 2 with gram-negative rods  2. 07/07/14 urine cx >>final 3. 07/07/14 c diff screen >>negative x 2  ANTIBIOTICS: 1. Zosyn (one dose) 07/06/14 2. Vancomycin 07/06/14 >>12/14 3. Cefepime 07/07/14 >> 4. Flagyl 07/07/14 >> 5. Oral vanc 12/12>>>12/13   Antibiotics: Anti-infectives    Start     Dose/Rate Route Frequency Ordered Stop   07/10/14 0045  ceFEPIme (MAXIPIME) 2 g in dextrose 5 % 50 mL IVPB     2 g100 mL/hr over 30 Minutes Intravenous Every 12 hours 07/10/14 0042     07/07/14 1000  vancomycin (VANCOCIN) 500 mg in sodium chloride 0.9 % 100 mL IVPB  Status:  Discontinued     500 mg100 mL/hr over 60 Minutes Intravenous Every 12 hours 07/07/14 0226 07/08/14 1006   07/07/14 0800  ceFEPIme (MAXIPIME) 2 g in dextrose 5 % 50 mL IVPB  Status:  Discontinued     2 g100 mL/hr over 30 Minutes Intravenous Every 12 hours 07/07/14 0226 07/09/14 1234   07/07/14 0530  vancomycin (VANCOCIN) 50 mg/mL oral solution 500 mg  Status:  Discontinued     500 mg Per Tube 4 times per day 07/07/14 0508 07/07/14 2054   07/07/14 0000  metroNIDAZOLE (FLAGYL) IVPB 500 mg     500 mg100 mL/hr over 60 Minutes Intravenous  Every 8 hours 07/06/14 2352     07/06/14 2245  vancomycin (VANCOCIN) 1,250 mg in sodium chloride 0.9 % 250 mL IVPB     1,250 mg166.7 mL/hr over 90 Minutes Intravenous  Once 07/06/14 2236 07/07/14 0116   07/06/14 2245  piperacillin-tazobactam (ZOSYN) IVPB 3.375 g     3.375 g100 mL/hr over 30 Minutes Intravenous  Once 07/06/14 2236 07/06/14 2332        HPI/Subjective: I'm feeling better.    Objective: Filed Vitals:   07/11/14 1054 07/11/14 1110 07/11/14 1112 07/11/14 1136  BP: 107/77 149/76 149/76 173/56  Pulse:  69 65   Temp:    98 F (36.7 C)  TempSrc:    Oral  Resp: 32 24 23 18   Height:      Weight:      SpO2: 99% 99%  99% 100%    Intake/Output Summary (Last 24 hours) at 07/11/14 1521 Last data filed at 07/11/14 1108  Gross per 24 hour  Intake    100 ml  Output      0 ml  Net    100 ml   Filed Weights   07/09/14 0507 07/10/14 0419 07/11/14 0500  Weight: 76.2 kg (167 lb 15.9 oz) 76.1 kg (167 lb 12.3 oz) 76 kg (167 lb 8.8 oz)    Exam: General: obese, elderly female,  NAD, appears stated age.  Lying comfortably in bed.  N/G in place.   HEENT:   EOMI, Anicteic Sclera, MMM. Dried drainage around right eye. Edentulous.   Neck: Supple, thick, no JVD, no masses  Cardiovascular: RRR, S1 S2 auscultated, no rubs, murmurs or gallops.   Respiratory: Clear to auscultation bilaterally with equal chest rise  Abdomen: Obese, less tender to palpation.  Soft distended, Tympanic BS.  Black "water" in rectal bag.  Flexiseal in place. Extremities: warm dry without cyanosis clubbing or edema.  Neuro: AAOx3, cranial nerves grossly intact. Able to move all extremities, speech is clear. Skin: redness across the left face.  No frank rash or bruising. Psych: Normal affect and demeanor with intact judgement and insight   Data Reviewed:  2D Echo Study Conclusions  - Left ventricle: The cavity size was normal. There was mild concentric hypertrophy. Systolic function was normal.  The estimated ejection fraction was in the range of 60% to 65%. Wall motion was normal; there were no regional wall motion abnormalities. - Aortic valve: There was trivial regurgitation. - Left atrium: The atrium was mildly dilated.  Basic Metabolic Panel:  Recent Labs Lab 07/07/14 0100 07/08/14 0815 07/08/14 1855 07/09/14 0625 07/10/14 0522 07/11/14 0645  NA 131* 137  --  135* 134* 130*  K 4.4 3.1*  --  3.1* 4.0 3.9  CL 95* 103  --  100 100 97  CO2 20 22  --  25 23 24   GLUCOSE 174* 134*  --  92 88 96  BUN 19 17  --  8 7 5*  CREATININE 0.91 0.76  --  0.53 0.49* 0.43*  CALCIUM 8.4 8.8  --  8.8 8.7 8.8  MG  --   --  2.1  --   --   --    Liver Function Tests:  Recent Labs Lab 07/06/14 2143 07/07/14 0100 07/08/14 0815  AST 18 25 14   ALT 11 11 8   ALKPHOS 134* 107 69  BILITOT 0.7 0.9 0.5  PROT 7.4 5.9* 5.6*  ALBUMIN 3.9 3.0* 2.5*    Recent Labs Lab 07/07/14 0100  LIPASE 41   CBC:  Recent Labs Lab 07/06/14 2143 07/06/14 2153 07/07/14 0415 07/08/14 0815 07/09/14 0625 07/10/14 0522  WBC 14.8*  --  7.1 6.8 5.8 5.6  NEUTROABS 12.0*  --   --  5.1  --   --   HGB 15.0 17.0* 13.7 11.8* 10.4* 10.2*  HCT 43.9 50.0* 41.2 35.3* 31.5* 30.7*  MCV 86.9  --  89.4 86.5 86.8 87.5  PLT 280  --  242 263 244 246   Cardiac Enzymes:  Recent Labs Lab 07/06/14 2143 07/07/14 0100 07/07/14 0612 07/08/14 1855 07/09/14 0625  TROPONINI <0.30 <0.30 <0.30 <0.30 <0.30   CBG:  Recent Labs Lab 07/10/14 1947 07/11/14 0008 07/11/14 0441 07/11/14 0816 07/11/14 1135  GLUCAP 122* 84 93 97 94    Recent Results (from the past 240 hour(s))  Blood Culture (routine x 2)  Status: None (Preliminary result)   Collection Time: 07/06/14 10:24 PM  Result Value Ref Range Status   Specimen Description BLOOD LEFT HAND  Final   Special Requests BOTTLES DRAWN AEROBIC ONLY 3CC  Final   Culture  Setup Time   Final    07/07/2014 10:18 Performed at [facility]    Culture    Final           BLOOD CULTURE RECEIVED NO GROWTH TO DATE CULTURE WILL BE HELD FOR 5 DAYS BEFORE ISSUING A FINAL NEGATIVE REPORT Performed at [facility]    Report Status PENDING  Incomplete  Blood Culture (routine x 2)     Status: None (Preliminary result)   Collection Time: 07/06/14 10:31 PM  Result Value Ref Range Status   Specimen Description BLOOD RIGHT ANTECUBITAL  Final   Special Requests BOTTLES DRAWN AEROBIC AND ANAEROBIC 5CC EA  Final   Culture  Setup Time   Final    07/07/2014 10:18 Performed at [facility]    Culture   Final    GRAM NEGATIVE RODS Note: Gram Stain Report Called to,Read Back By and Verified With: ANNE LOWE ON 07/10/2014 AT 12:14A BY WILEJ Performed at [facility]    Report Status PENDING  Incomplete  Clostridium Difficile by PCR     Status: None   Collection Time: 07/07/14 12:52 AM  Result Value Ref Range Status   C difficile by pcr NEGATIVE NEGATIVE Final  Urine culture     Status: None   Collection Time: 07/07/14 12:53 AM  Result Value Ref Range Status   Specimen Description URINE, CATHETERIZED  Final   Special Requests URINE, RANDOM  Final   Culture  Setup Time   Final    07/07/2014 11:15 Performed at [facility]    Colony Count NO GROWTH Performed at [facility]   Final   Culture NO GROWTH Performed at [facility]   Final   Report Status 07/08/2014 FINAL  Final  MRSA PCR Screening     Status: Abnormal   Collection Time: 07/07/14  3:43 AM  Result Value Ref Range Status   MRSA by PCR POSITIVE (A) NEGATIVE Final    Comment:        The GeneXpert MRSA Assay (FDA approved for NASAL specimens only), is one component of a comprehensive MRSA colonization surveillance program. It is not intended to diagnose MRSA infection nor to guide or monitor treatment for MRSA infections. RESULT CALLED TO, READ BACK BY AND VERIFIED WITH: [facility] RN [id number] 07/07/14 MITCHELL,L   Stool culture      Status: None   Collection Time: 07/07/14 12:00 PM  Result Value Ref Range Status   Specimen Description STOOL  Final   Special Requests NONE  Final   Culture   Final    NO SALMONELLA, SHIGELLA, CAMPYLOBACTER, YERSINIA, OR E.COLI [id number] ISOLATED Note: REDUCED NORMAL FLORA PRESENT Performed at [facility]    Report Status 07/10/2014 FINAL  Final  Clostridium Difficile by PCR     Status: None   Collection Time: 07/07/14 12:57 PM  Result Value Ref Range Status   C difficile by pcr NEGATIVE NEGATIVE Final  Cath Tip Culture     Status: None (Preliminary result)   Collection Time: 07/10/14  2:15 PM  Result Value Ref Range Status   Specimen Description CATH TIP  Final   Special Requests LEFT CVC  Final   Culture NO GROWTH Performed at [facility]   Final   Report Status PENDING  Incomplete     Studies: Ct Abdomen Pelvis Wo Contrast  07/07/2014   CLINICAL DATA:  Nausea and vomiting. Lower abdominal pain. History of small bowel obstructions.  EXAM: CT ABDOMEN AND PELVIS WITHOUT CONTRAST  TECHNIQUE: Multidetector CT imaging of the abdomen and pelvis was performed following the standard protocol without IV contrast.  COMPARISON:  01/30/2014  FINDINGS: Atelectasis in the lung bases. Coronary artery calcifications. There appears to be an enteric tube with tip in the distal esophagus. Suggest advancement of the stomach for better positioning.  The unenhanced appearance of the liver, spleen, pancreas, kidneys, inferior vena cava, and retroperitoneal lymph nodes is unremarkable. Calcification of the abdominal aorta without aneurysm. Right adrenal gland nodule measures 2.8 cm diameter. Density measurements consistent with fat. 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 No change since prior study. Surgical absence of the gallbladder. Mild bile duct dilatation is likely physiologic. Stomach appears normal. Old thickening in the small bowel suggesting enteritis. No evidence of small bowel dilatation.  Colon is diffusely stool-filled with evidence of wall thickening in the transverse and descending portions. Mild pericolonic infiltration. This suggests colitis, likely of infectious or inflammatory etiology. No free air or free fluid in the abdomen.  Pelvis: Foley catheter in the bladder. Thickening of the wall of the rectosigmoid colon with pericolonic infiltration consistent with proctitis and colitis. There appears to be a balloon catheter in the rectum. Small amount of free fluid in the pelvis is probably reactive. Appendix is not identified. No pelvic mass or lymphadenopathy. Degenerative changes in the lumbar spine. No destructive bone lesions appreciated.  IMPRESSION: Wall thickening in the transverse, descending, and rectosigmoid colon consistent with nonspecific colitis, likely infectious or inflammatory. Small bowel fold thickening suggest enteritis. No evidence of small bowel obstruction. Enteric tube tip is in the distal esophagus and should be advanced if placement in the stomach is desired. Foley and rectal catheters are present.   Electronically Signed   By: Burman Nieves M.D.   On: 07/07/2014 03:29   Ct Head Wo Contrast  07/06/2014   CLINICAL DATA:  Found down, last seen normal at 2030 hr, altered mental status, LEFT-sided weakness and slurred speech.  EXAM: CT HEAD WITHOUT CONTRAST  TECHNIQUE: Contiguous axial images were obtained from the base of the skull through the vertex without intravenous contrast.  COMPARISON:  CT of the head March 01, 2010  FINDINGS: Mild motion degraded examination.  The ventricles and sulci are normal for age. No intraparenchymal hemorrhage, mass effect nor midline shift. Patchy supratentorial and pontine white matter hypodensities are within normal range for patient's age and though non-specific suggest sequelae of chronic small vessel ischemic disease. No acute large vascular territory infarcts.  No abnormal extra-axial fluid collections. Basal cisterns are  patent. Moderate calcific atherosclerosis of the carotid siphons and included vertebra arteries.  No skull fracture. The included ocular globes and orbital contents are non-suspicious. Status post bilateral ocular lens implants. RIGHT sphenoid mucosal retention cyst without paranasal sinus air-fluid levels. Patient is edentulous. Soft tissue within the external auditory canals most consistent with cerumen. 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  IMPRESSION: No acute intracranial process on this mildly motion degraded examination.  Moderate to severe white matter changes suggest chronic small vessel ischemic disease. If clinical concern persists for acute ischemia, MRI of the brain with diffusion-weighted sequences would be more sensitive.  Acute findings discussed with and reconfirmed by Dr.Kirkpatrick on 07/06/2014 at 9:55 pm.   Electronically  Signed   By: Awilda Metro   On: 07/06/2014 22:05   Dg Chest Portable 1 View  07/07/2014   CLINICAL DATA:  Central line placement.  EXAM: PORTABLE CHEST - 1 VIEW  COMPARISON:  Chest radiograph July 06, 2014  FINDINGS: Interval placement of LEFT subclavian central venous catheter with distal tip projecting in mid superior vena cava. No pneumothorax.  The cardiac silhouette is upper limits of normal in size. Mediastinal silhouette is nonsuspicious, mildly calcified aortic knob. Strandy density persists in LEFT lung base. No pleural effusion. RIGHT humeral arthroplasty. Severe degenerative change of the LEFT shoulder, LEFT distal clavicle osteotomy.  IMPRESSION: Interval placement of LEFT subclavian central venous catheter, distal tip projecting in mid superior vena cava. No pneumothorax.  Stable cardiomegaly and LEFT lung base atelectasis.   Electronically Signed   By: Awilda Metro   On: 07/07/2014 01:17   Dg Chest Port 1 View  07/06/2014   CLINICAL DATA:  Hypotension, history of COPD, hypertension, shortness of breath and pneumonia.  EXAM:  PORTABLE CHEST - 1 VIEW  COMPARISON:  Chest radiograph January 30, 2014  FINDINGS: Cardiac silhouette is upper limits of normal in size. Tortuous, mildly calcified aorta. Strandy densities LEFT lung base. No pleural effusion or focal consolidation. No pneumothorax. Edit  Coil densities in the neck are unchanged, and may be vascular. Status post RIGHT humeral arthroplasty. Bilateral distal clavicle resection, severe degenerative change LEFT shoulder.  IMPRESSION: Borderline cardiomegaly. LEFT lung base atelectasis versus scarring.   Electronically Signed   By: Awilda Metro   On: 07/06/2014 22:51   Dg Abd Portable 1v  07/07/2014   CLINICAL DATA:  Abdominal pain.  NG tube placement.  EXAM: PORTABLE ABDOMEN - 1 VIEW  COMPARISON:  01/31/2014  FINDINGS: The enteric tube tip projects over the EG junction region with proximal side hole projected over the lower esophagus. Appearance consistent with placement in the distal esophagus. Residual contrast material in the stomach and small bowel.  IMPRESSION: Enteric tube tip projects over the distal esophagus. Advancement to the stomach is recommended for better placement.   Electronically Signed   By: Burman Nieves M.D.   On: 07/07/2014 02:52    Scheduled Meds: . antiseptic oral rinse  7 mL Mouth Rinse BID  . aspirin  81 mg Oral Daily  . benazepril  20 mg Oral BID  . ceFEPime (MAXIPIME) IV  2 g Intravenous Q12H  . DULoxetine  60 mg Oral Daily  . famotidine (PEPCID) IV  20 mg Intravenous Q24H  . insulin aspart  0-9 Units Subcutaneous 6 times per day  . labetalol  300 mg Oral BID  . metronidazole  500 mg Intravenous Q8H  . mupirocin ointment  1 application Nasal BID  . oxybutynin  10 mg Oral Daily  . pantoprazole  40 mg Oral Daily   Continuous Infusions:    Active Problems:   Septic shock   Dysarthria   Abdominal pain   Central line insertion site infection   Hypotension   Adynamic ileus   Colitis   Ileus   Tachycardia, paroxysmal   Paroxysmal  SVT (supraventricular tachycardia)   Essential hypertension    Conley Canal  [facility] Pager [phone]. If 7PM-7AM, please contact night-coverage at [email or web address], password [facility] 07/11/2014, 3:21 PM  LOS: 5 days   Patient seen and examined. Agree with above NP done by Algis Downs, AP-C She denies abdominal pain, still with diarrhea.   Ulice Follett, Md.

## 2014-07-11 NOTE — Care Management Note (Signed)
    Page 1 of 1   07/11/2014     3:17:54 PM CARE MANAGEMENT NOTE 07/11/2014  Patient:  Veronica Key,Veronica Key   Account Number:  [id number]  Date Initiated:  07/10/2014  Documentation initiated by:  Letha CapeAYLOR,Loye Vento  Subjective/Objective Assessment:   dx copd, nstemi, colits  admit- from home.     Action/Plan:   Anticipated DC Date:  07/11/2014   Anticipated DC Plan:  SKILLED NURSING FACILITY  In-house referral  Clinical Social Worker      DC Planning Services  CM consult      Choice offered to / List presented to:             Status of service:  Completed, signed off Medicare Important Message given?  YES (If response is "NO", the following Medicare IM given date fields will be blank) Date Medicare IM given:  07/08/2014 Medicare IM given by:  Letha CapeAYLOR,Tyishia Aune Date Additional Medicare IM given:  07/11/2014 Additional Medicare IM given by:  Letha CapeEBORAH Marland Reine  Discharge Disposition:  SKILLED NURSING FACILITY  Per UR Regulation:  Reviewed for med. necessity/level of care/duration of stay  If discussed at Long Length of Stay Meetings, dates discussed:    Comments:  07/11/14 1515 Letha Capeeborah Karolee Meloni RN, BSN [phone] patient from home. for flez sig today, await bld cx. Patient for snf tomorow.

## 2014-07-11 NOTE — Progress Notes (Signed)
Pt was given total of 3L of tap water enemas this AM to prep for procedure. Output was mixture of clear and tan.

## 2014-07-11 NOTE — Op Note (Signed)
[facility] [address] [place], [id number]   FLEXIBLE SIGMOIDOSCOPY PROCEDURE REPORT  PATIENT: Veronica Key, Veronica Key  MR#: [id number] BIRTHDATE: [birth year] , 73  yrs. old GENDER: female ENDOSCOPIST: Bernette Redbirdobert Braydee Shimkus, MD REFERRED BY: unassigned PROCEDURE DATE:  07/11/2014 PROCEDURE:   flexible sigmoidoscopy with biopsy ASA CLASS:   III INDICATIONS:  colitis with persistent diarrhea, having presented with gram-negative sepsis MEDICATIONS: fentanyl 25 g, Versed  4 mg IV  DESCRIPTION OF PROCEDURE:   After the risks benefits and alternatives of the procedure were thoroughly explained, informed consent was obtained.  digital exam showed no anal stenosis or significant perianal disease other than cutaneous erythema      The the Pentax pediatric video colonoscope was introduced through the anus  and advanced to 30 cm      , The exam was Without limitations.    The quality of the prep was  very adequate      . The instrument was then slowly withdrawn as the mucosa was fully examined.       the patient was [facility]. Written consent had been Provided, but the risks of the procedure were reviewed with the patient. Time out was performed and she received the above sedation and remained stable throughout the procedure.  The scope was advanced to approximately 30 cm without any difficulty.  The colon had extensive ulcerative, necrotic, exudative colitis in a predominantly linear (rather than circumferential) distribution, beginning in the proximal rectum and extending beyond the limit of the exam. I did not see anything that looked frankly gangrenous, but the degree of necrosis and exudate was extensive; there was not any erythema, so this was probably in the early resolving phase of ischemic colitis.  Several biopsies were obtained. I did not see any polyps or masses or diverticulosis.  I did not  attempt to advance the scope proximally to see how far the colitis went, in part for fear of causing damage to the colon or other complications, and in part because the CT scan had already demonstrated the extent of inflammation.  Retroflexion was not performed.         The scope was then withdrawn from the patient and the procedure terminated.  COMPLICATIONS: There were no immediate complications.  ENDOSCOPIC IMPRESSION: severe exudative colitis, endoscopically suggestive of ischemic colitis, probably in the resolving phase based on both clinical and endoscopic criteria  RECOMMENDATIONS: await pathology on today's biopsies  REPEAT EXAM:  eSigned:  Bernette Redbirdobert Mahika Vanvoorhis, MD 07/11/2014 11:52 AM   CC:  PATIENT NAME:  Veronica Key, Veronica Key MR#: [id number]

## 2014-07-11 NOTE — Progress Notes (Signed)
Patient's sigmoidoscopy shows changes suggestive of ischemic colitis, as expected (path pending).  We will sign off at this time. Please call us if we can be of further assistance in the patient's care.  As her diarrhea resolves, her diet can be gradually advanced.  Ordinarily, we do not do routine follow-up sigmoidoscopy to confirm healing, as long as the patient's symptoms resolved satisfactorily.  Duration of antibiotic therapy is unclear. It does not appear that this is likely an infectious process; if the patient's biopsies come back showing changes suggestive of ischemia, I would stop the antibiotics once she is felt to have had a sufficient course to treat her episode of septic shock with gram-negative bacteremia. In other words, she does not need antibiotics for the ischemic colitis itself.  Florencia Reasonsobert V. Robbin Escher, M.D. [phone]

## 2014-07-11 NOTE — Progress Notes (Signed)
Physical Therapy Treatment Patient Details Name: Veronica Key MRN: [id number] DOB: Aug 19, 1940 Today's Date: 07/11/2014    History of Present Illness 73 yo female with pmh that includes COPD, SBO, gastric ulcer reports her illness came on suddenly.  She began having simultaneous vomiting and diarrhea and subsequently passed out (she does not remember the syncopal episode).  She was found with facial droop and brought in as a code stroke.  The code stroke was cancelled when her CT head showed no acute changes.  She denies feeling poorly recently and states that she knows of no sick contacts.  She was admitting to the ICU and required pressors.  CT Abdomen shows .  Working diagnosis of colitis.      PT Comments    Patient is s/p sigmoidoscopy this morning and is still somewhat lethargic, reports feeling weak.  She denies pain except in her shoulders when using RW.  Pt able to ambulate today with min assist but with stool incontinence during session; chair to follow for all gait activities.  Removed supplemental O2 at start of session, SpO2 remained above 90% throughout session.  Educated on pursed lip breathing.  Will continue to follow to maximize independence and improve functional mobility.     Follow Up Recommendations  SNF     Equipment Recommendations  Other (comment) (TBD)    Recommendations for Other Services OT consult     Precautions / Restrictions Precautions Precautions: Fall Restrictions Weight Bearing Restrictions: No    Mobility  Bed Mobility Overal bed mobility: Needs Assistance Bed Mobility: Supine to Sit     Supine to sit: Min assist;HOB elevated     General bed mobility comments: Pt able to move legs off side of bed without help but required min assist to elevate trunk to seated position.  Verbal cues for sequencing and to scoot EOB.  Transfers Overall transfer level: Needs assistance Equipment used: Rolling walker (2 wheeled) Transfers: Sit to/from Stand  (x2) Sit to Stand: Min assist         General transfer comment: Min assist to power up to stand from EOB; verbal cues for hand placement and upright posture.  Ambulation/Gait Ambulation/Gait assistance: Min assist Ambulation Distance (Feet): 12 Feet Assistive device: Rolling walker (2 wheeled) Gait Pattern/deviations: Step-to pattern;Decreased step length - right;Shuffle;Trunk flexed Gait velocity: very slow Gait velocity interpretation: Below normal speed for age/gender General Gait Details: Pt with difficulty using RW secondary to pain in shoulders; Pt able to take steps from bed to chair for seated rest break then from chair to door.  Pt with stool incontinence during ambulation.  Min assist to maintain balance and to advance RLE with declining level of assist required throughout session.  Verbal cues for posture and sequencing.  Encouragement throughout.   Stairs            Wheelchair Mobility    Modified Rankin (Stroke Patients Only)       Balance Overall balance assessment: Needs assistance Sitting-balance support: No upper extremity supported;Feet supported Sitting balance-Leahy Scale: Fair     Standing balance support: Bilateral upper extremity supported;During functional activity Standing balance-Leahy Scale: Poor                      Cognition Arousal/Alertness: Awake/alert Behavior During Therapy: WFL for tasks assessed/performed Overall Cognitive Status: Within Functional Limits for tasks assessed  Exercises General Exercises - Lower Extremity Ankle Circles/Pumps: AROM;Supine;15 reps;Both Hip Flexion/Marching: Strengthening;Both;5 reps;Supine (Resistance with flexion and extension)    General Comments        Pertinent Vitals/Pain Pain Assessment: Faces Pain Score: 4  Pain Location: bilateral shoulders during ambulation with RW Pain Intervention(s): Limited activity within patient's tolerance;Monitored during  session  SpO2 resting: 96% (RA) SpO2 during activity: 91-96% (RA)    Home Living                      Prior Function            PT Goals (current goals can now be found in the care plan section) Acute Rehab PT Goals PT Goal Formulation: With patient Time For Goal Achievement: 07/23/14 Potential to Achieve Goals: Good Progress towards PT goals: Progressing toward goals    Frequency  Min 3X/week    PT Plan Current plan remains appropriate    Co-evaluation             End of Session   Activity Tolerance: Patient limited by fatigue Patient left: in chair;with chair alarm set;with call bell/phone within reach     Time: 1211-1251 PT Time Calculation (min) (ACUTE ONLY): 40 min  Charges:  $Gait Training: 8-22 mins $Therapeutic Activity: 23-37 mins                    G Codes:      Veronica Key, Veronica Key 07/11/2014, 2:37 PM

## 2014-07-12 ENCOUNTER — Encounter (HOSPITAL_COMMUNITY): Payer: Self-pay | Admitting: Gastroenterology

## 2014-07-12 LAB — CULTURE, BLOOD (ROUTINE X 2)

## 2014-07-12 LAB — CBC
HEMATOCRIT: 32.1 % — AB (ref 36.0–46.0)
Hemoglobin: 10.6 g/dL — ABNORMAL LOW (ref 12.0–15.0)
MCH: 28.4 pg (ref 26.0–34.0)
MCHC: 33 g/dL (ref 30.0–36.0)
MCV: 86.1 fL (ref 78.0–100.0)
PLATELETS: 267 10*3/uL (ref 150–400)
RBC: 3.73 MIL/uL — ABNORMAL LOW (ref 3.87–5.11)
RDW: 13.5 % (ref 11.5–15.5)
WBC: 7.7 10*3/uL (ref 4.0–10.5)

## 2014-07-12 LAB — GLUCOSE, CAPILLARY
GLUCOSE-CAPILLARY: 111 mg/dL — AB (ref 70–99)
GLUCOSE-CAPILLARY: 129 mg/dL — AB (ref 70–99)
GLUCOSE-CAPILLARY: 118 mg/dL — AB (ref 70–99)
Glucose-Capillary: 125 mg/dL — ABNORMAL HIGH (ref 70–99)
Glucose-Capillary: 93 mg/dL (ref 70–99)

## 2014-07-12 LAB — BASIC METABOLIC PANEL
ANION GAP: 9 (ref 5–15)
BUN: 3 mg/dL — ABNORMAL LOW (ref 6–23)
CALCIUM: 8.7 mg/dL (ref 8.4–10.5)
CO2: 28 meq/L (ref 19–32)
Chloride: 93 mEq/L — ABNORMAL LOW (ref 96–112)
Creatinine, Ser: 0.48 mg/dL — ABNORMAL LOW (ref 0.50–1.10)
GFR calc Af Amer: >90 mL/min (ref >90)
GFR calc non Af Amer: >90 mL/min (ref >90)
GLUCOSE: 106 mg/dL — AB (ref 70–99)
POTASSIUM: 3.2 meq/L — AB (ref 3.7–5.3)
SODIUM: 130 meq/L — AB (ref 137–147)

## 2014-07-12 MED ORDER — LABETALOL HCL 200 MG PO TABS
400.0000 mg | ORAL_TABLET | Freq: Two times a day (BID) | ORAL | Status: DC
  Administered 2014-07-12 – 2014-07-14 (×4): 400 mg via ORAL
  Filled 2014-07-12 (×5): qty 2

## 2014-07-12 MED ORDER — POTASSIUM CHLORIDE CRYS ER 20 MEQ PO TBCR
40.0000 meq | EXTENDED_RELEASE_TABLET | Freq: Two times a day (BID) | ORAL | Status: AC
  Administered 2014-07-12 (×2): 40 meq via ORAL
  Filled 2014-07-12 (×2): qty 2

## 2014-07-12 MED ORDER — INSULIN ASPART 100 UNIT/ML ~~LOC~~ SOLN: 0.0000 [IU] | Freq: Three times a day (TID) | SUBCUTANEOUS | Status: DC

## 2014-07-12 NOTE — Clinical Social Work Note (Signed)
Patient has chosen bed at [facility]. Per patient's request, CSW attempted to reach son Genevie CheshireBilly to notify him of this. Genevie CheshireBilly did not answer and CSW was unable to leave a voicemail.   Roddie McBryant Aneya Daddona MSW, [place], [place], [phone]

## 2014-07-12 NOTE — Clinical Social Work Placement (Addendum)
Clinical Social Work Department CLINICAL SOCIAL WORK PLACEMENT NOTE 07/12/2014  Patient:  [name],Veronica L  Account Number:  [id number] Admit date:  07/06/2014  Clinical Social Worker:  Cherre BlancJOSEPH BRYANT CAMPBELL, [place]  Date/time:  07/10/2014 04:00 PM  Clinical Social Work is seeking post-discharge placement for this patient at the following level of care:   SKILLED NURSING   (*CSW will update this form in Epic as items are completed)   07/10/2014  [facility]/family provided with [facility] Department of Clinical Social Work's list of facilities offering this level of care within the geographic area requested by the patient (or if unable, by the patient's family).  07/10/2014  Patient/family informed of their freedom to choose among providers that offer the needed level of care, that participate in Medicare, Medicaid or managed care program needed by the patient, have an available bed and are willing to accept the patient.  07/10/2014  Patient/family informed of [facility]' ownership interest in [facility], as well as of the fact that they are under no obligation to receive care at this facility.  PASARR submitted to EDS on  PASARR number received on   FL2 transmitted to all facilities in geographic area requested by pt/family on  07/10/2014 FL2 transmitted to all facilities within larger geographic area on   Patient informed that his/her managed care company has contracts with or will negotiate with  certain facilities, including the following:     Patient/family informed of bed offers received:  07/11/2014 [facility] Physician recommends and patient chooses bed at    [facility]  07/14/2014 Patient to be transferred to facility by PTAR-Aleck Locklin Patrick-Jefferson, LCSWA, DP Patient and family notified of transfer on Patient to notify son on Monday, 07/15/14.  Name of family member notified:    The  following physician request were entered in Epic:   Additional Comments: Patient has bed at [facility] [facility] and facility states they will be able to accept patient over weekend. MD please write DC order and complete DC summary when patient is ready for DC.  07/14/14-[facility] states they do not have a bed available for patient on this date. [facility] Patient states she will make son aware of bed decision as he has a Christmas party he is hosting on this date and she does not want him interrupted. Vivi Barrackrystal Patrick-Jefferson, [place] Weekend Clinical Social Worker [phone]  Roddie McBryant Campbell MSW, [place], [place], [phone]

## 2014-07-12 NOTE — Progress Notes (Signed)
PROGRESS NOTE  Veronica Key ZOX:[id number] DOB: 01/17/1941 DOA: 07/06/2014 PCP: [name], PA-C  73 yo female with pmh that includes COPD, SBO, gastric ulcer reports her illness came on suddenly.  She began having simultaneous vomiting and diarrhea and subsequently passed out.  She was found with facial droop and brought in as a code stroke.  The code stroke was cancelled when her CT head showed no acute changes.  She denies feeling poorly recently (prior to her sudden vomiting) and states that she knows of no sick contacts.  She was admitting to the ICU and required pressors.  CT Abdomen showed thickening in the transverse, descending and rectosigmoid colon.    Assessment/Plan:  Distributive Shock Resolved.  Patient's BP was 62/49 on admission.  Hypotensive, Hypovolemic and infectious.  One of 2 blood cultures is positive for gram-negative rods (anaerobe - still pending 12/18).  Urine culture is negative.  C-diff negative x 2.  Stool culture is negative.  Was initially treated with IV and Oral Vanc, Cefepime, and flagyl.  Antibiotics have been narrowed to cefepime and flagyl.   she was initially admitted to the ICU and required norepinephrine.  Now shock has resolved and patient is stable on the floor.  BP medications have been resumed.  Colitis Ischemic due to low flow state.  C-diff has been ruled out - oral vanc discontinued.  Stool culture negative. Rectal bag fell out and was left out purposefully.  [facility] consulted and preformed flexible sigmoidoscopy.  Biopsies were taken.  Path results show ischemic changes with no signs of malignancy. Will Discontinue Flagyl.   Gram negative bacteremia Patient is positive in one of 2 blood cultures for gram-negative rods. 12/[facility] this is an anaerobe and more tests are being run on it.   She is currently receiving IV cefepime and Flagyl.  We believe the source may be translocation from the gut.  Checking culture  on central line cath tip as well.   Syncope.  Probable TIA CT head negative.    CTA head is negative.  Patient is unable to lay flat for an MRI. 2D echo completed - normal.  Results below.  Carotid Dopplers pending Has been seen by stroke team.  [name] believes she had a TIA secondary to low blood flow.  He recommends 81 mg aspirin daily.  Started aspirin 12/17. Hgb stable.  Inferior ST segment elevation on EKG w/o reciprocal changes. Resolved on subsequent EKG.  2D echo is normal.  Troponin cycled and are negative. Patient without chest pain.  Vomiting / Diarrhea Patient improving. NG removed December 15. Flexiseal removed December 16.  Still with some diarrhea, but overall feeling better. Patient advanced to carb modified diet on 12/18.  Strict I's and O's.   Adynamic Ileus (12/15) Improving clinically. Patient's bowel sounds are no longer tympanic. Appreciate GI consultation.  Increase K++ to 4.5 - 5.0.  Mobilize patient.  Eliminate narcotics and anticholinergics.  Normocytic Anemia Hgb has trended down while the patient was in the hospital, but appears to have stabilized at approximately 10.  Continue to monitor.  Aspirin started 12/17 as hgb remains stable.   Hypokalemia Secondary to GI losses.  Continue to replete orally. Mg replaced.   Amphetamine Positive  On tox screen.  ??  Hypertension On Benazepril and Labetalol at home.  These were held initially  due to hypotensive shock.  Patient had nonsustained Vtach and cardiology was consulted.  Labetalol was restarted at low  dose and runs of vtach subsided.  Patient's BP and pulse rate are rising.  BP medications (labetalol and benazepril) have been added back.  D/C'ing HCTZ due to decrease in sodium.  Will add prn hydralazine for SBP > 180.  Chronic pain - bilateral shoulders. Reports she is on oxy at home, but this is not on her med list.  She does not appear to be on any pain medication in house. Will add Tylenol.    Mild  Hyponatremia (12/17) Secondary to diarrhea? HCTZ?  Will stop HCTZ and monitor.  DVT Prophylaxis:  SCDs   Code Status: DNR Family Communication: Patient alert and orientated. Disposition Plan: inpatient.  PT recommends SNF rehab. Patient agreeable.   SIGNIFICANT EVENTS / STUDIES:  1. 07/06/14 - EMS --> ED after found down with facial droop  2. 07/06/14 - CT head shows chronic small vessel disease but no acute process - code stroke canceled 3. 07/06/14 - EKG with ST segment elevation in inferior leads - code STEMI canceled by cardiology 4. 07/06/14 - Norepi started for shock, CCM called for admission  LINES / TUBES: 1. PIVs 07/06/14 >> 2. Foley 07/06/14 >>12/15 3. Left subclavian CVC 07/06/14 >>12/16 (check culture of tip) 4. N/G d/c'd 12/15. 5. Rectal dc'd 12/16  CULTURES: 1. 07/06/14 blood cx x2 >> 1 of 2 with gram-negative rods  2. 07/07/14 urine cx >>final 3. 07/07/14 c diff screen >>negative x 2  ANTIBIOTICS: 1. Zosyn (one dose) 07/06/14 2. Vancomycin 07/06/14 >>12/14 3. Cefepime 07/07/14 >> 4. Flagyl 07/07/14 >> 5. Oral vanc 12/12>>>12/13   Antibiotics: Anti-infectives    Start     Dose/Rate Route Frequency Ordered Stop   07/10/14 0045  ceFEPIme (MAXIPIME) 2 g in dextrose 5 % 50 mL IVPB     2 g100 mL/hr over 30 Minutes Intravenous Every 12 hours 07/10/14 0042     07/07/14 1000  vancomycin (VANCOCIN) 500 mg in sodium chloride 0.9 % 100 mL IVPB  Status:  Discontinued     500 mg100 mL/hr over 60 Minutes Intravenous Every 12 hours 07/07/14 0226 07/08/14 1006   07/07/14 0800  ceFEPIme (MAXIPIME) 2 g in dextrose 5 % 50 mL IVPB  Status:  Discontinued     2 g100 mL/hr over 30 Minutes Intravenous Every 12 hours 07/07/14 0226 07/09/14 1234   07/07/14 0530  vancomycin (VANCOCIN) 50 mg/mL oral solution 500 mg  Status:  Discontinued     500 mg Per Tube 4 times per day 07/07/14 0508 07/07/14 2054   07/07/14 0000  metroNIDAZOLE (FLAGYL) IVPB 500 mg     500 mg100 mL/hr over 60  Minutes Intravenous Every 8 hours 07/06/14 2352     07/06/14 2245  vancomycin (VANCOCIN) 1,250 mg in sodium chloride 0.9 % 250 mL IVPB     1,250 mg166.7 mL/hr over 90 Minutes Intravenous  Once 07/06/14 2236 07/07/14 0116   07/06/14 2245  piperacillin-tazobactam (ZOSYN) IVPB 3.375 g     3.375 g100 mL/hr over 30 Minutes Intravenous  Once 07/06/14 2236 07/06/14 2332        HPI/Subjective: Feeling better and looking forward to going home.  Objective: Filed Vitals:   07/11/14 1136 07/11/14 2229 07/12/14 0545 07/12/14 1027  BP: 173/56 164/58 144/69 144/55  Pulse:   66 70  Temp: 98 F (36.7 C) 98 F (36.7 C) 98.7 F (37.1 C)   TempSrc: Oral Oral Oral   Resp: 18 21 22    Height:      Weight:  SpO2: 100% 99% 98%     Intake/Output Summary (Last 24 hours) at 07/12/14 1126 Last data filed at 07/11/14 2259  Gross per 24 hour  Intake    360 ml  Output      0 ml  Net    360 ml   Filed Weights   07/09/14 0507 07/10/14 0419 07/11/14 0500  Weight: 76.2 kg (167 lb 15.9 oz) 76.1 kg (167 lb 12.3 oz) 76 kg (167 lb 8.8 oz)    Exam: General: obese, elderly female,  NAD, appears stated age.  Smiles. HEENT:   EOMI, Anicteic Sclera, MMM. .   Neck: Supple, thick, no JVD, no masses  Cardiovascular: RRR, S1 S2 auscultated, no rubs, murmurs or gallops.   Respiratory: Clear to auscultation bilaterally with equal chest rise  Abdomen: Obese, less tender to palpation.  Soft, +BS, no masses. Extremities: warm dry without cyanosis clubbing or edema.  Neuro: AAOx3, cranial nerves grossly intact. Able to move all extremities, speech is clear. Skin: redness across the left face (birth mark).  No frank rash or bruising. Psych: Normal affect and demeanor with intact judgement and insight.  Pleasant.   Data Reviewed:  2D Echo Study Conclusions  - Left ventricle: The cavity size was normal. There was mild concentric hypertrophy. Systolic function was normal. The estimated ejection fraction was  in the range of 60% to 65%. Wall motion was normal; there were no regional wall motion abnormalities. - Aortic valve: There was trivial regurgitation. - Left atrium: The atrium was mildly dilated.  Flexible Sigmoidoscopy 12/16  Basic Metabolic Panel:  Recent Labs Lab 07/08/14 0815 07/08/14 1855 07/09/14 0625 07/10/14 0522 07/11/14 0645 07/12/14 0630  NA 137  --  135* 134* 130* 130*  K 3.1*  --  3.1* 4.0 3.9 3.2*  CL 103  --  100 100 97 93*  CO2 22  --  25 23 24 28   GLUCOSE 134*  --  92 88 96 106*  BUN 17  --  8 7 5* 3*  CREATININE 0.76  --  0.53 0.49* 0.43* 0.48*  CALCIUM 8.8  --  8.8 8.7 8.8 8.7  MG  --  2.1  --   --   --   --    Liver Function Tests:  Recent Labs Lab 07/06/14 2143 07/07/14 0100 07/08/14 0815  AST 18 25 14   ALT 11 11 8   ALKPHOS 134* 107 69  BILITOT 0.7 0.9 0.5  PROT 7.4 5.9* 5.6*  ALBUMIN 3.9 3.0* 2.5*    Recent Labs Lab 07/07/14 0100  LIPASE 41   CBC:  Recent Labs Lab 07/06/14 2143  07/07/14 0415 07/08/14 0815 07/09/14 0625 07/10/14 0522 07/12/14 0630  WBC 14.8*  --  7.1 6.8 5.8 5.6 7.7  NEUTROABS 12.0*  --   --  5.1  --   --   --   HGB 15.0  < > 13.7 11.8* 10.4* 10.2* 10.6*  HCT 43.9  < > 41.2 35.3* 31.5* 30.7* 32.1*  MCV 86.9  --  89.4 86.5 86.8 87.5 86.1  PLT 280  --  242 263 244 246 267  < > = values in this interval not displayed. Cardiac Enzymes:  Recent Labs Lab 07/06/14 2143 07/07/14 0100 07/07/14 0612 07/08/14 1855 07/09/14 0625  TROPONINI <0.30 <0.30 <0.30 <0.30 <0.30   CBG:  Recent Labs Lab 07/11/14 1706 07/11/14 2024 07/12/14 0019 07/12/14 0539 07/12/14 0747  GLUCAP 116* 118* 125* 111* 93    Recent Results (from the past  240 hour(s))  Blood Culture (routine x 2)     Status: None (Preliminary result)   Collection Time: 07/06/14 10:24 PM  Result Value Ref Range Status   Specimen Description BLOOD LEFT HAND  Final   Special Requests BOTTLES DRAWN AEROBIC ONLY 3CC  Final   Culture  Setup Time    Final    07/07/2014 10:18 Performed at [facility]    Culture   Final           BLOOD CULTURE RECEIVED NO GROWTH TO DATE CULTURE WILL BE HELD FOR 5 DAYS BEFORE ISSUING A FINAL NEGATIVE REPORT Performed at [facility]    Report Status PENDING  Incomplete  Blood Culture (routine x 2)     Status: None (Preliminary result)   Collection Time: 07/06/14 10:31 PM  Result Value Ref Range Status   Specimen Description BLOOD RIGHT ANTECUBITAL  Final   Special Requests BOTTLES DRAWN AEROBIC AND ANAEROBIC 5CC EA  Final   Culture  Setup Time   Final    07/07/2014 10:18 Performed at [facility]    Culture   Final    GRAM NEGATIVE RODS Note: Gram Stain Report Called to,Read Back By and Verified With: ANNE LOWE ON 07/10/2014 AT 12:14A BY WILEJ Performed at [facility]    Report Status PENDING  Incomplete  Clostridium Difficile by PCR     Status: None   Collection Time: 07/07/14 12:52 AM  Result Value Ref Range Status   C difficile by pcr NEGATIVE NEGATIVE Final  Urine culture     Status: None   Collection Time: 07/07/14 12:53 AM  Result Value Ref Range Status   Specimen Description URINE, CATHETERIZED  Final   Special Requests URINE, RANDOM  Final   Culture  Setup Time   Final    07/07/2014 11:15 Performed at [facility]    Colony Count NO GROWTH Performed at [facility]   Final   Culture NO GROWTH Performed at [facility]   Final   Report Status 07/08/2014 FINAL  Final  MRSA PCR Screening     Status: Abnormal   Collection Time: 07/07/14  3:43 AM  Result Value Ref Range Status   MRSA by PCR POSITIVE (A) NEGATIVE Final    Comment:        The GeneXpert MRSA Assay (FDA approved for NASAL specimens only), is one component of a comprehensive MRSA colonization surveillance program. It is not intended to diagnose MRSA infection nor to guide or monitor treatment for MRSA infections. RESULT CALLED TO, READ BACK BY AND  VERIFIED WITH: [facility] RN [id number] 07/07/14 MITCHELL,L   Stool culture     Status: None   Collection Time: 07/07/14 12:00 PM  Result Value Ref Range Status   Specimen Description STOOL  Final   Special Requests NONE  Final   Culture   Final    NO SALMONELLA, SHIGELLA, CAMPYLOBACTER, YERSINIA, OR E.COLI [id number] ISOLATED Note: REDUCED NORMAL FLORA PRESENT Performed at [facility]    Report Status 07/10/2014 FINAL  Final  Clostridium Difficile by PCR     Status: None   Collection Time: 07/07/14 12:57 PM  Result Value Ref Range Status   C difficile by pcr NEGATIVE NEGATIVE Final  Cath Tip Culture     Status: None (Preliminary result)   Collection Time: 07/10/14  2:15 PM  Result Value Ref Range Status   Specimen Description CATH TIP  Final   Special Requests LEFT  CVC  Final   Culture   Final    NO GROWTH 2 DAYS [facility]    Report Status PENDING  Incomplete     Studies: Ct Abdomen Pelvis Wo Contrast  07/07/2014   CLINICAL DATA:  Nausea and vomiting. Lower abdominal pain. History of small bowel obstructions.  EXAM: CT ABDOMEN AND PELVIS WITHOUT CONTRAST  TECHNIQUE: Multidetector CT imaging of the abdomen and pelvis was performed following the standard protocol without IV contrast.  COMPARISON:  01/30/2014  FINDINGS: Atelectasis in the lung bases. Coronary artery calcifications. There appears to be an enteric tube with tip in the distal esophagus. Suggest advancement of the stomach for better positioning.  The unenhanced appearance of the liver, spleen, pancreas, kidneys, inferior vena cava, and retroperitoneal lymph nodes is unremarkable. Calcification of the abdominal aorta without aneurysm. Right adrenal gland nodule measures 2.8 cm diameter. Density measurements consistent with fat. 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 No change since prior study. Surgical absence of the gallbladder. Mild bile duct dilatation is likely physiologic. Stomach appears normal. Old  thickening in the small bowel suggesting enteritis. No evidence of small bowel dilatation. Colon is diffusely stool-filled with evidence of wall thickening in the transverse and descending portions. Mild pericolonic infiltration. This suggests colitis, likely of infectious or inflammatory etiology. No free air or free fluid in the abdomen.  Pelvis: Foley catheter in the bladder. Thickening of the wall of the rectosigmoid colon with pericolonic infiltration consistent with proctitis and colitis. There appears to be a balloon catheter in the rectum. Small amount of free fluid in the pelvis is probably reactive. Appendix is not identified. No pelvic mass or lymphadenopathy. Degenerative changes in the lumbar spine. No destructive bone lesions appreciated.  IMPRESSION: Wall thickening in the transverse, descending, and rectosigmoid colon consistent with nonspecific colitis, likely infectious or inflammatory. Small bowel fold thickening suggest enteritis. No evidence of small bowel obstruction. Enteric tube tip is in the distal esophagus and should be advanced if placement in the stomach is desired. Foley and rectal catheters are present.   Electronically Signed   By: Burman NievesWilliam  Stevens M.D.   On: 07/07/2014 03:29   Ct Head Wo Contrast  07/06/2014   CLINICAL DATA:  Found down, last seen normal at 2030 hr, altered mental status, LEFT-sided weakness and slurred speech.  EXAM: CT HEAD WITHOUT CONTRAST  TECHNIQUE: Contiguous axial images were obtained from the base of the skull through the vertex without intravenous contrast.  COMPARISON:  CT of the head March 01, 2010  FINDINGS: Mild motion degraded examination.  The ventricles and sulci are normal for age. No intraparenchymal hemorrhage, mass effect nor midline shift. Patchy supratentorial and pontine white matter hypodensities are within normal range for patient's age and though non-specific suggest sequelae of chronic small vessel ischemic disease. No acute large  vascular territory infarcts.  No abnormal extra-axial fluid collections. Basal cisterns are patent. Moderate calcific atherosclerosis of the carotid siphons and included vertebra arteries.  No skull fracture. The included ocular globes and orbital contents are non-suspicious. Status post bilateral ocular lens implants. RIGHT sphenoid mucosal retention cyst without paranasal sinus air-fluid levels. Patient is edentulous. Soft tissue within the external auditory canals most consistent with cerumen. 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  IMPRESSION: No acute intracranial process on this mildly motion degraded examination.  Moderate to severe white matter changes suggest chronic small vessel ischemic disease. If clinical concern persists for acute ischemia, MRI of the brain with diffusion-weighted sequences would be more  sensitive.  Acute findings discussed with and reconfirmed by Dr.Kirkpatrick on 07/06/2014 at 9:55 pm.   Electronically Signed   By: Awilda Metro   On: 07/06/2014 22:05   Dg Chest Portable 1 View  07/07/2014   CLINICAL DATA:  Central line placement.  EXAM: PORTABLE CHEST - 1 VIEW  COMPARISON:  Chest radiograph July 06, 2014  FINDINGS: Interval placement of LEFT subclavian central venous catheter with distal tip projecting in mid superior vena cava. No pneumothorax.  The cardiac silhouette is upper limits of normal in size. Mediastinal silhouette is nonsuspicious, mildly calcified aortic knob. Strandy density persists in LEFT lung base. No pleural effusion. RIGHT humeral arthroplasty. Severe degenerative change of the LEFT shoulder, LEFT distal clavicle osteotomy.  IMPRESSION: Interval placement of LEFT subclavian central venous catheter, distal tip projecting in mid superior vena cava. No pneumothorax.  Stable cardiomegaly and LEFT lung base atelectasis.   Electronically Signed   By: Awilda Metro   On: 07/07/2014 01:17   Dg Chest Port 1 View  07/06/2014   CLINICAL  DATA:  Hypotension, history of COPD, hypertension, shortness of breath and pneumonia.  EXAM: PORTABLE CHEST - 1 VIEW  COMPARISON:  Chest radiograph January 30, 2014  FINDINGS: Cardiac silhouette is upper limits of normal in size. Tortuous, mildly calcified aorta. Strandy densities LEFT lung base. No pleural effusion or focal consolidation. No pneumothorax. Edit  Coil densities in the neck are unchanged, and may be vascular. Status post RIGHT humeral arthroplasty. Bilateral distal clavicle resection, severe degenerative change LEFT shoulder.  IMPRESSION: Borderline cardiomegaly. LEFT lung base atelectasis versus scarring.   Electronically Signed   By: Awilda Metro   On: 07/06/2014 22:51   Dg Abd Portable 1v  07/07/2014   CLINICAL DATA:  Abdominal pain.  NG tube placement.  EXAM: PORTABLE ABDOMEN - 1 VIEW  COMPARISON:  01/31/2014  FINDINGS: The enteric tube tip projects over the EG junction region with proximal side hole projected over the lower esophagus. Appearance consistent with placement in the distal esophagus. Residual contrast material in the stomach and small bowel.  IMPRESSION: Enteric tube tip projects over the distal esophagus. Advancement to the stomach is recommended for better placement.   Electronically Signed   By: Burman Nieves M.D.   On: 07/07/2014 02:52    Scheduled Meds: . antiseptic oral rinse  7 mL Mouth Rinse BID  . aspirin  81 mg Oral Daily  . benazepril  20 mg Oral BID  . ceFEPime (MAXIPIME) IV  2 g Intravenous Q12H  . DULoxetine  60 mg Oral Daily  . insulin aspart  0-9 Units Subcutaneous TID WC  . labetalol  300 mg Oral BID  . metronidazole  500 mg Intravenous Q8H  . oxybutynin  10 mg Oral Daily  . pantoprazole  40 mg Oral Daily  . potassium chloride  40 mEq Oral BID   Continuous Infusions:    Active Problems:   Septic shock   Dysarthria   Abdominal pain   Central line insertion site infection   Hypotension   Adynamic ileus   Colitis   Ileus   Tachycardia,  paroxysmal   Paroxysmal SVT (supraventricular tachycardia)   Essential hypertension    Conley Canal  [facility] Pager [phone]. If 7PM-7AM, please contact night-coverage at [email or web address], password [facility] 07/12/2014, 11:26 AM  LOS: 6 days   Patient seen and examined. Agree with above PN.  Disposition:  -Pathology from colon Biopsy: ischemic changes, no evidence of malignancy. Will discontinue  IV flagyl.  -Hypokalemia; replete.  -Blood Culture; Gram negative rods, results pending.   Belkys Regalado, Md.

## 2014-07-13 DIAGNOSIS — R7881 Bacteremia: Secondary | ICD-10-CM

## 2014-07-13 LAB — CBC
HCT: 32.3 % — ABNORMAL LOW (ref 36.0–46.0)
HEMOGLOBIN: 10.9 g/dL — AB (ref 12.0–15.0)
MCH: 29.7 pg (ref 26.0–34.0)
MCHC: 33.7 g/dL (ref 30.0–36.0)
MCV: 88 fL (ref 78.0–100.0)
Platelets: 284 10*3/uL (ref 150–400)
RBC: 3.67 MIL/uL — ABNORMAL LOW (ref 3.87–5.11)
RDW: 13.7 % (ref 11.5–15.5)
WBC: 7.7 10*3/uL (ref 4.0–10.5)

## 2014-07-13 LAB — GLUCOSE, CAPILLARY
Glucose-Capillary: 106 mg/dL — ABNORMAL HIGH (ref 70–99)
Glucose-Capillary: 124 mg/dL — ABNORMAL HIGH (ref 70–99)
Glucose-Capillary: 107 mg/dL — ABNORMAL HIGH (ref 70–99)
Glucose-Capillary: 146 mg/dL — ABNORMAL HIGH (ref 70–99)

## 2014-07-13 LAB — BASIC METABOLIC PANEL
ANION GAP: 11 (ref 5–15)
BUN: 4 mg/dL — ABNORMAL LOW (ref 6–23)
CO2: 26 mEq/L (ref 19–32)
CREATININE: 0.47 mg/dL — AB (ref 0.50–1.10)
Calcium: 8.6 mg/dL (ref 8.4–10.5)
Chloride: 95 mEq/L — ABNORMAL LOW (ref 96–112)
GFR calc non Af Amer: >90 mL/min (ref >90)
Glucose, Bld: 106 mg/dL — ABNORMAL HIGH (ref 70–99)
Potassium: 4 mEq/L (ref 3.7–5.3)
SODIUM: 132 meq/L — AB (ref 137–147)

## 2014-07-13 LAB — CULTURE, BLOOD (ROUTINE X 2): Culture: NO GROWTH

## 2014-07-13 LAB — CATH TIP CULTURE: CULTURE: NO GROWTH

## 2014-07-13 MED ORDER — OXYCODONE HCL 5 MG PO TABS: 5.0000 mg | ORAL_TABLET | Freq: Four times a day (QID) | ORAL | Status: DC | PRN

## 2014-07-13 MED ORDER — SACCHAROMYCES BOULARDII 250 MG PO CAPS
250.0000 mg | ORAL_CAPSULE | Freq: Two times a day (BID) | ORAL | Status: DC
  Administered 2014-07-13 – 2014-07-14 (×2): 250 mg via ORAL
  Filled 2014-07-13 (×3): qty 1

## 2014-07-13 NOTE — Progress Notes (Signed)
PROGRESS NOTE  Veronica Key ZOX:[id number] DOB: 1940-11-27 DOA: 07/06/2014 PCP: [name], PA-C  73 yo female with pmh that includes COPD, SBO, gastric ulcer reports her illness came on suddenly.  She began having simultaneous vomiting and diarrhea and subsequently passed out.  She was found with facial droop and brought in as a code stroke.  The code stroke was cancelled when her CT head showed no acute changes.  She denies feeling poorly recently (prior to her sudden vomiting) and states that she knows of no sick contacts.  She was admitting to the ICU and required pressors.  CT Abdomen showed thickening in the transverse, descending and rectosigmoid colon.    Assessment/Plan:  Distributive Shock Resolved.  Patient's BP was 62/49 on admission.  Hypotensive, Hypovolemic and infectious.  One of 2 blood cultures is positive for gram-negative rods (anaerobe - still pending 12/18).  Urine culture is negative.  C-diff negative x 2.  Stool culture is negative.  Was initially treated with IV and Oral Vanc, Cefepime, and flagyl.  Antibiotics have been narrowed to cefepime and flagyl.   she was initially admitted to the ICU and required norepinephrine.  Now shock has resolved and patient is stable on the floor.  BP medications have been resumed; doing ok  Colitis Ischemic due to low flow state.  C-diff has been ruled out - oral vanc discontinued.  Stool culture negative. Rectal bag fell out and was left out purposefully.  [facility] consulted and preformed flexible sigmoidoscopy.  Biopsies were taken.  Path results show ischemic changes with no signs of malignancy. -No need of antibiotics for ischemic colitis   Gram negative bacteremia (Bacteroides Uniformis) Patient is positive in one of 2 blood cultures for gram-negative rods. 12/[facility] this is an anaerobe and more tests are being run on it.   She is currently receiving IV cefepime and Flagyl.  We believe the source  may be translocation from the gut.  Checking culture on central line cath tip as well (so far neg).  -will discussed with ID regarding need of treatment and for how long  Syncope.  Probable TIA CT head negative.    CTA head is negative.  Patient is unable to lay flat for an MRI. 2D echo completed - normal.  Results below.  Carotid Dopplers pending Has been seen by stroke team.  [name] believes she had a TIA secondary to low blood flow.  He recommends 81 mg aspirin daily.  Started aspirin 12/17.   Inferior ST segment elevation on EKG w/o reciprocal changes. Resolved on subsequent EKG.  2D echo is normal.  Troponin cycled and are negative. Patient without chest pain or SOB -no further intervention planned by cardiology  Vomiting / Diarrhea Patient improving. NG removed December 15. Flexiseal removed December 16.  Still with some diarrhea, but overall feeling better. Patient advanced to carb modified diet on 12/18.   -will continue electrolytes repletion  Adynamic Ileus (12/15) Improving clinically. Patient's bowel sounds are no longer tympanic. Appreciate GI consultation.  Increase K++ to 4.5 - 5.0.  Mobilize patient.  Eliminate narcotics and anticholinergics as much as possible.  Normocytic Anemia Hgb has trended down while the patient was in the hospital, but appears to have stabilized at approximately 10.  Continue to monitor.  Aspirin started 12/17 as hgb remains stable.   Hypokalemia Secondary to GI losses.  Continue to repletion orally as needed. Mg replaced.   Hypertension On Benazepril and Labetalol  at home.  These were held initially  due to hypotensive shock.  Patient had nonsustained Vtach and cardiology was consulted.  Labetalol was restarted at lower dose and runs of vtach subsided.  Patient's BP and pulse rate are rising.  BP medications (labetalol and benazepril) have been added back.  D/C'ing HCTZ due to decrease in sodium.  Will continue prn hydralazine for SBP >  180.  Chronic pain - bilateral shoulders. Reports she is on oxy at home, but this is not on her med list.  She does not appear to be on any pain medication in house. Will add Tylenol and low dose oxy for severe pain.   Mild Hyponatremia (12/17) Secondary to diarrhea? HCTZ?   Will stop HCTZ and monitor. Gentle hydration as needed (patient eating and drinking well)  Physical deconditioning -PT recommends SNF -most likely in 1-2 days  DVT Prophylaxis:  SCDs   Code Status: DNR Family Communication: Patient alert and orientated. Disposition Plan: inpatient.  PT recommends SNF rehab. Patient agreeable.   SIGNIFICANT EVENTS / STUDIES:  1. 07/06/14 - EMS --> ED after found down with facial droop  2. 07/06/14 - CT head shows chronic small vessel disease but no acute process - code stroke canceled 3. 07/06/14 - EKG with ST segment elevation in inferior leads - code STEMI canceled by cardiology 4. 07/06/14 - Norepi started for shock, CCM called for admission  LINES / TUBES: 1. PIVs 07/06/14 >> 2. Foley 07/06/14 >>12/15 3. Left subclavian CVC 07/06/14 >>12/16 (check culture of tip) 4. N/G d/c'd 12/15. 5. Rectal dc'd 12/16  CULTURES: 1. 07/06/14 blood cx x2 >> 1 of 2 with gram-negative rods  2. 07/07/14 urine cx >>final 3. 07/07/14 c diff screen >>negative x 2  ANTIBIOTICS: 1. Zosyn (one dose) 07/06/14>>12/18 2. Vancomycin 07/06/14 >>12/14 3. Cefepime 07/07/14 >> 4. Flagyl 07/07/14 >>12/18 5. Oral vanc 12/12>>>12/13   Antibiotics: Anti-infectives    Start     Dose/Rate Route Frequency Ordered Stop   07/10/14 0045  ceFEPIme (MAXIPIME) 2 g in dextrose 5 % 50 mL IVPB     2 g100 mL/hr over 30 Minutes Intravenous Every 12 hours 07/10/14 0042     07/07/14 1000  vancomycin (VANCOCIN) 500 mg in sodium chloride 0.9 % 100 mL IVPB  Status:  Discontinued     500 mg100 mL/hr over 60 Minutes Intravenous Every 12 hours 07/07/14 0226 07/08/14 1006   07/07/14 0800  ceFEPIme (MAXIPIME) 2 g  in dextrose 5 % 50 mL IVPB  Status:  Discontinued     2 g100 mL/hr over 30 Minutes Intravenous Every 12 hours 07/07/14 0226 07/09/14 1234   07/07/14 0530  vancomycin (VANCOCIN) 50 mg/mL oral solution 500 mg  Status:  Discontinued     500 mg Per Tube 4 times per day 07/07/14 0508 07/07/14 2054   07/07/14 0000  metroNIDAZOLE (FLAGYL) IVPB 500 mg  Status:  Discontinued     500 mg100 mL/hr over 60 Minutes Intravenous Every 8 hours 07/06/14 2352 07/12/14 1348   07/06/14 2245  vancomycin (VANCOCIN) 1,250 mg in sodium chloride 0.9 % 250 mL IVPB     1,250 mg166.7 mL/hr over 90 Minutes Intravenous  Once 07/06/14 2236 07/07/14 0116   07/06/14 2245  piperacillin-tazobactam (ZOSYN) IVPB 3.375 g     3.375 g100 mL/hr over 30 Minutes Intravenous  Once 07/06/14 2236 07/06/14 2332        HPI/Subjective: No fever. Still with diarrhea/fast intestinal transit.  Objective: Filed Vitals:   07/12/14 1515  07/12/14 2155 07/13/14 0629 07/13/14 1416  BP:  175/79 178/54 161/60  Pulse:    62  Temp:  99.3 F (37.4 C) 98.3 F (36.8 C) 98.1 F (36.7 C)  TempSrc:  Oral Oral Oral  Resp:  18 16 18   Height:      Weight: 73.165 kg (161 lb 4.8 oz)     SpO2:  96% 97% 97%    Intake/Output Summary (Last 24 hours) at 07/13/14 1750 Last data filed at 07/13/14 1418  Gross per 24 hour  Intake    478 ml  Output      0 ml  Net    478 ml   Filed Weights   07/10/14 0419 07/11/14 0500 07/12/14 1515  Weight: 76.1 kg (167 lb 12.3 oz) 76 kg (167 lb 8.8 oz) 73.165 kg (161 lb 4.8 oz)    Exam: General: obese, elderly female,  NAD, appears stated age. Complaining of fast intestinal transit currently. No fever HEENT:   EOMI, Anicteic Sclera, MMM. .   Neck: Supple, thick, no JVD, no masses  Cardiovascular: RRR, S1 S2 auscultated, no rubs, murmurs or gallops.   Respiratory: Clear to auscultation bilaterally with equal chest rise  Abdomen: Obese, less tender to palpation.  Soft, +BS, no masses. Extremities: warm dry without  cyanosis clubbing or edema.  Neuro: AAOx3, cranial nerves grossly intact. Able to move all extremities, speech is clear. Skin: redness across the left face (birth mark).  No frank rash or bruising. Psych: Normal affect and demeanor with intact judgement and insight.  Pleasant.   Data Reviewed:  2D Echo Study Conclusions  - Left ventricle: The cavity size was normal. There was mild concentric hypertrophy. Systolic function was normal. The estimated ejection fraction was in the range of 60% to 65%. Wall motion was normal; there were no regional wall motion abnormalities. - Aortic valve: There was trivial regurgitation. - Left atrium: The atrium was mildly dilated.  Flexible Sigmoidoscopy 12/16  Basic Metabolic Panel:  Recent Labs Lab 07/08/14 1855 07/09/14 0625 07/10/14 0522 07/11/14 0645 07/12/14 0630 07/13/14 0447  NA  --  135* 134* 130* 130* 132*  K  --  3.1* 4.0 3.9 3.2* 4.0  CL  --  100 100 97 93* 95*  CO2  --  25 23 24 28 26   GLUCOSE  --  92 88 96 106* 106*  BUN  --  8 7 5* 3* 4*  CREATININE  --  0.53 0.49* 0.43* 0.48* 0.47*  CALCIUM  --  8.8 8.7 8.8 8.7 8.6  MG 2.1  --   --   --   --   --    Liver Function Tests:  Recent Labs Lab 07/06/14 2143 07/07/14 0100 07/08/14 0815  AST 18 25 14   ALT 11 11 8   ALKPHOS 134* 107 69  BILITOT 0.7 0.9 0.5  PROT 7.4 5.9* 5.6*  ALBUMIN 3.9 3.0* 2.5*    Recent Labs Lab 07/07/14 0100  LIPASE 41   CBC:  Recent Labs Lab 07/06/14 2143  07/08/14 0815 07/09/14 0625 07/10/14 0522 07/12/14 0630 07/13/14 0447  WBC 14.8*  < > 6.8 5.8 5.6 7.7 7.7  NEUTROABS 12.0*  --  5.1  --   --   --   --   HGB 15.0  < > 11.8* 10.4* 10.2* 10.6* 10.9*  HCT 43.9  < > 35.3* 31.5* 30.7* 32.1* 32.3*  MCV 86.9  < > 86.5 86.8 87.5 86.1 88.0  PLT 280  < > 263  244 246 267 284  < > = values in this interval not displayed. Cardiac Enzymes:  Recent Labs Lab 07/06/14 2143 07/07/14 0100 07/07/14 0612 07/08/14 1855 07/09/14 0625    TROPONINI <0.30 <0.30 <0.30 <0.30 <0.30   CBG:  Recent Labs Lab 07/12/14 0747 07/12/14 1711 07/12/14 2150 07/13/14 0754 07/13/14 1142  GLUCAP 93 129* 118* 106* 124*    Recent Results (from the past 240 hour(s))  Blood Culture (routine x 2)     Status: None   Collection Time: 07/06/14 10:24 PM  Result Value Ref Range Status   Specimen Description BLOOD LEFT HAND  Final   Special Requests BOTTLES DRAWN AEROBIC ONLY 3CC  Final   Culture  Setup Time   Final    07/07/2014 10:18 Performed at [facility]    Culture   Final    NO GROWTH 5 DAYS Performed at [facility]    Report Status 07/13/2014 FINAL  Final  Blood Culture (routine x 2)     Status: None   Collection Time: 07/06/14 10:31 PM  Result Value Ref Range Status   Specimen Description BLOOD RIGHT ANTECUBITAL  Final   Special Requests BOTTLES DRAWN AEROBIC AND ANAEROBIC 5CC EA  Final   Culture  Setup Time   Final    07/07/2014 10:18 Performed at [facility]    Culture   Final    BACTEROIDES UNIFORMIS Note: BETA LACTAMASE POSITIVE Note: Gram Stain Report Called to,Read Back By and Verified With: ANNE LOWE ON 07/10/2014 AT 12:14A BY WILEJ Performed at [facility]    Report Status 07/12/2014 FINAL  Final  Clostridium Difficile by PCR     Status: None   Collection Time: 07/07/14 12:52 AM  Result Value Ref Range Status   C difficile by pcr NEGATIVE NEGATIVE Final  Urine culture     Status: None   Collection Time: 07/07/14 12:53 AM  Result Value Ref Range Status   Specimen Description URINE, CATHETERIZED  Final   Special Requests URINE, RANDOM  Final   Culture  Setup Time   Final    07/07/2014 11:15 Performed at [facility]    Colony Count NO GROWTH Performed at [facility]   Final   Culture NO GROWTH Performed at [facility]   Final   Report Status 07/08/2014 FINAL  Final  MRSA PCR Screening     Status: Abnormal   Collection Time: 07/07/14   3:43 AM  Result Value Ref Range Status   MRSA by PCR POSITIVE (A) NEGATIVE Final    Comment:        The GeneXpert MRSA Assay (FDA approved for NASAL specimens only), is one component of a comprehensive MRSA colonization surveillance program. It is not intended to diagnose MRSA infection nor to guide or monitor treatment for MRSA infections. RESULT CALLED TO, READ BACK BY AND VERIFIED WITH: [facility] RN [id number] 07/07/14 MITCHELL,L   Stool culture     Status: None   Collection Time: 07/07/14 12:00 PM  Result Value Ref Range Status   Specimen Description STOOL  Final   Special Requests NONE  Final   Culture   Final    NO SALMONELLA, SHIGELLA, CAMPYLOBACTER, YERSINIA, OR E.COLI [id number] ISOLATED Note: REDUCED NORMAL FLORA PRESENT Performed at [facility]    Report Status 07/10/2014 FINAL  Final  Clostridium Difficile by PCR     Status: None   Collection Time: 07/07/14 12:57 PM  Result Value Ref Range Status  C difficile by pcr NEGATIVE NEGATIVE Final  Cath Tip Culture     Status: None   Collection Time: 07/10/14  2:15 PM  Result Value Ref Range Status   Specimen Description CATH TIP  Final   Special Requests LEFT CVC  Final   Culture   Final    NO GROWTH 3 DAYS Performed at [facility]    Report Status 07/13/2014 FINAL  Final     Studies: Ct Abdomen Pelvis Wo Contrast  07/07/2014   CLINICAL DATA:  Nausea and vomiting. Lower abdominal pain. History of small bowel obstructions.  EXAM: CT ABDOMEN AND PELVIS WITHOUT CONTRAST  TECHNIQUE: Multidetector CT imaging of the abdomen and pelvis was performed following the standard protocol without IV contrast.  COMPARISON:  01/30/2014  FINDINGS: Atelectasis in the lung bases. Coronary artery calcifications. There appears to be an enteric tube with tip in the distal esophagus. Suggest advancement of the stomach for better positioning.  The unenhanced appearance of the liver, spleen, pancreas, kidneys, inferior vena cava, and  retroperitoneal lymph nodes is unremarkable. Calcification of the abdominal aorta without aneurysm. Right adrenal gland nodule measures 2.8 cm diameter. Density measurements consistent with fat. 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 No change since prior study. Surgical absence of the gallbladder. Mild bile duct dilatation is likely physiologic. Stomach appears normal. Old thickening in the small bowel suggesting enteritis. No evidence of small bowel dilatation. Colon is diffusely stool-filled with evidence of wall thickening in the transverse and descending portions. Mild pericolonic infiltration. This suggests colitis, likely of infectious or inflammatory etiology. No free air or free fluid in the abdomen.  Pelvis: Foley catheter in the bladder. Thickening of the wall of the rectosigmoid colon with pericolonic infiltration consistent with proctitis and colitis. There appears to be a balloon catheter in the rectum. Small amount of free fluid in the pelvis is probably reactive. Appendix is not identified. No pelvic mass or lymphadenopathy. Degenerative changes in the lumbar spine. No destructive bone lesions appreciated.  IMPRESSION: Wall thickening in the transverse, descending, and rectosigmoid colon consistent with nonspecific colitis, likely infectious or inflammatory. Small bowel fold thickening suggest enteritis. No evidence of small bowel obstruction. Enteric tube tip is in the distal esophagus and should be advanced if placement in the stomach is desired. Foley and rectal catheters are present.   Electronically Signed   By: Burman NievesWilliam  Stevens M.D.   On: 07/07/2014 03:29   Ct Head Wo Contrast  07/06/2014   CLINICAL DATA:  Found down, last seen normal at 2030 hr, altered mental status, LEFT-sided weakness and slurred speech.  EXAM: CT HEAD WITHOUT CONTRAST  TECHNIQUE: Contiguous axial images were obtained from the base of the skull through the vertex without intravenous contrast.  COMPARISON:  CT of the head March 01, 2010  FINDINGS: Mild motion degraded examination.  The ventricles and sulci are normal for age. No intraparenchymal hemorrhage, mass effect nor midline shift. Patchy supratentorial and pontine white matter hypodensities are within normal range for patient's age and though non-specific suggest sequelae of chronic small vessel ischemic disease. No acute large vascular territory infarcts.  No abnormal extra-axial fluid collections. Basal cisterns are patent. Moderate calcific atherosclerosis of the carotid siphons and included vertebra arteries.  No skull fracture. The included ocular globes and orbital contents are non-suspicious. Status post bilateral ocular lens implants. RIGHT sphenoid mucosal retention cyst without paranasal sinus air-fluid levels. Patient is edentulous. Soft tissue within the external auditory canals most consistent with cerumen. 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  IMPRESSION: No acute intracranial process on this mildly motion degraded examination.  Moderate to severe white matter changes suggest chronic small vessel ischemic disease. If clinical concern persists for acute ischemia, MRI of the brain with diffusion-weighted sequences would be more sensitive.  Acute findings discussed with and reconfirmed by Dr.Kirkpatrick on 07/06/2014 at 9:55 pm.   Electronically Signed   By: Awilda Metro   On: 07/06/2014 22:05   Dg Chest Portable 1 View  07/07/2014   CLINICAL DATA:  Central line placement.  EXAM: PORTABLE CHEST - 1 VIEW  COMPARISON:  Chest radiograph July 06, 2014  FINDINGS: Interval placement of LEFT subclavian central venous catheter with distal tip projecting in mid superior vena cava. No pneumothorax.  The cardiac silhouette is upper limits of normal in size. Mediastinal silhouette is nonsuspicious, mildly calcified aortic knob. Strandy density persists in LEFT lung base. No pleural effusion. RIGHT humeral arthroplasty. Severe degenerative change of the LEFT  shoulder, LEFT distal clavicle osteotomy.  IMPRESSION: Interval placement of LEFT subclavian central venous catheter, distal tip projecting in mid superior vena cava. No pneumothorax.  Stable cardiomegaly and LEFT lung base atelectasis.   Electronically Signed   By: Awilda Metro   On: 07/07/2014 01:17   Dg Chest Port 1 View  07/06/2014   CLINICAL DATA:  Hypotension, history of COPD, hypertension, shortness of breath and pneumonia.  EXAM: PORTABLE CHEST - 1 VIEW  COMPARISON:  Chest radiograph January 30, 2014  FINDINGS: Cardiac silhouette is upper limits of normal in size. Tortuous, mildly calcified aorta. Strandy densities LEFT lung base. No pleural effusion or focal consolidation. No pneumothorax. Edit  Coil densities in the neck are unchanged, and may be vascular. Status post RIGHT humeral arthroplasty. Bilateral distal clavicle resection, severe degenerative change LEFT shoulder.  IMPRESSION: Borderline cardiomegaly. LEFT lung base atelectasis versus scarring.   Electronically Signed   By: Awilda Metro   On: 07/06/2014 22:51   Dg Abd Portable 1v  07/07/2014   CLINICAL DATA:  Abdominal pain.  NG tube placement.  EXAM: PORTABLE ABDOMEN - 1 VIEW  COMPARISON:  01/31/2014  FINDINGS: The enteric tube tip projects over the EG junction region with proximal side hole projected over the lower esophagus. Appearance consistent with placement in the distal esophagus. Residual contrast material in the stomach and small bowel.  IMPRESSION: Enteric tube tip projects over the distal esophagus. Advancement to the stomach is recommended for better placement.   Electronically Signed   By: Burman Nieves M.D.   On: 07/07/2014 02:52    Scheduled Meds: . aspirin  81 mg Oral Daily  . benazepril  20 mg Oral BID  . ceFEPime (MAXIPIME) IV  2 g Intravenous Q12H  . DULoxetine  60 mg Oral Daily  . labetalol  400 mg Oral BID  . oxybutynin  10 mg Oral Daily  . pantoprazole  40 mg Oral Daily   Continuous Infusions:      Active Problems:   Septic shock   Dysarthria   Abdominal pain   Central line insertion site infection   Hypotension   Adynamic ileus   Colitis   Ileus   Tachycardia, paroxysmal   Paroxysmal SVT (supraventricular tachycardia)   Essential hypertension    Vassie Loll MD [phone]. If 7PM-7AM, please contact night-coverage at [email or web address], password [facility] 07/13/2014, 5:50 PM  LOS: 7 days

## 2014-07-14 DIAGNOSIS — G459 Transient cerebral ischemic attack, unspecified: Secondary | ICD-10-CM | POA: Insufficient documentation

## 2014-07-14 DIAGNOSIS — K559 Vascular disorder of intestine, unspecified: Secondary | ICD-10-CM

## 2014-07-14 DIAGNOSIS — R1032 Left lower quadrant pain: Secondary | ICD-10-CM

## 2014-07-14 LAB — GLUCOSE, CAPILLARY
GLUCOSE-CAPILLARY: 108 mg/dL — AB (ref 70–99)
Glucose-Capillary: 95 mg/dL (ref 70–99)

## 2014-07-14 MED ORDER — ASPIRIN 81 MG PO CHEW: 81.0000 mg | CHEWABLE_TABLET | Freq: Every day | ORAL | Status: AC

## 2014-07-14 MED ORDER — OXYCODONE HCL 5 MG PO TABS: 5.0000 mg | ORAL_TABLET | Freq: Four times a day (QID) | ORAL | Status: DC | PRN

## 2014-07-14 MED ORDER — LORAZEPAM 1 MG PO TABS: 1.0000 mg | ORAL_TABLET | Freq: Three times a day (TID) | ORAL | Status: DC

## 2014-07-14 MED ORDER — ACETAMINOPHEN 325 MG PO TABS: 650.0000 mg | ORAL_TABLET | Freq: Four times a day (QID) | ORAL | Status: AC | PRN

## 2014-07-14 MED ORDER — HYDRALAZINE HCL 10 MG PO TABS: 10.0000 mg | ORAL_TABLET | Freq: Three times a day (TID) | ORAL | Status: AC

## 2014-07-14 MED ORDER — LEVOFLOXACIN 500 MG PO TABS
500.0000 mg | ORAL_TABLET | Freq: Every day | ORAL | Status: DC
  Administered 2014-07-14: 500 mg via ORAL
  Filled 2014-07-14: qty 1

## 2014-07-14 MED ORDER — TIZANIDINE HCL 4 MG PO TABS: 4.0000 mg | ORAL_TABLET | Freq: Three times a day (TID) | ORAL | Status: DC | PRN

## 2014-07-14 MED ORDER — LEVOFLOXACIN 500 MG PO TABS: 500.0000 mg | ORAL_TABLET | Freq: Every day | ORAL | Status: AC

## 2014-07-14 MED ORDER — SACCHAROMYCES BOULARDII 250 MG PO CAPS: 250.0000 mg | ORAL_CAPSULE | Freq: Two times a day (BID) | ORAL | Status: AC

## 2014-07-14 MED ORDER — SIMETHICONE 80 MG PO CHEW: 80.0000 mg | CHEWABLE_TABLET | Freq: Four times a day (QID) | ORAL | Status: AC | PRN

## 2014-07-14 NOTE — Discharge Summary (Signed)
Physician Discharge Summary  Veronica Key ZOX:[id number] DOB: Jun 21, 1941 DOA: 07/06/2014  PCP: [name], PA-C  Admit date: 07/06/2014 Discharge date: 07/14/2014  Time spent: >30 minutes  Recommendations for Outpatient Follow-up:  Check BMET in 5 days to follow electrolytes Reassess BP and adjust medications as needed Check CBC to follow Hgb trend  Discharge Diagnoses:  Active Problems:   Septic shock   Dysarthria   Abdominal pain   Central line insertion site infection   Hypotension   Adynamic ileus   Colitis   Ileus   Tachycardia, paroxysmal   Paroxysmal SVT (supraventricular tachycardia)   Essential hypertension   Discharge Condition: stable and improved. Will discharge to SNF for rehab.  Diet recommendation: heart healthy diet  Filed Weights   07/11/14 0500 07/12/14 1515 07/14/14 0614  Weight: 76 kg (167 lb 8.8 oz) 73.165 kg (161 lb 4.8 oz) 70.8 kg (156 lb 1.4 oz)    History of present illness:  73 yo female with pmh that includes COPD, SBO, gastric ulcer reports her illness came on suddenly. She began having simultaneous vomiting and diarrhea and subsequently passed out. She was found with facial droop and brought in as a code stroke. The code stroke was cancelled when her CT head showed no acute changes. She denies feeling poorly recently (prior to her sudden vomiting) and states that she knows of no sick contacts. She was admitting to the ICU and required pressors. CT Abdomen showed thickening in the transverse, descending and rectosigmoid colon.   Hospital Course:  Distributive Shock Resolved. Patient's BP was 62/49 on admission. Hypotensive, Hypovolemic and infectious. One of 2 blood cultures is positive for gram-negative rods (anaerobe - still pending 12/18). Urine culture is negative. C-diff negative x 2. Stool culture is negative. Was initially treated with IV and Oral Vanc, Cefepime, and flagyl. Antibiotics have been narrowed to cefepime and  flagyl. she was initially admitted to the ICU and required norepinephrine. Now shock has resolved and patient is stable on the floor. BP medications have been resumed; VS and patient doing ok. Will discharge to SNF for rehab  Colitis Ischemic due to low flow state. C-diff has been ruled out - oral vanc discontinued. Stool culture negative. Rectal bag fell out and was left out purposefully. [facility] consulted and preformed flexible sigmoidoscopy. Biopsies were taken. Path results show ischemic changes with no signs of malignancy. -No need of antibiotics for ischemic colitis   Gram negative bacteremia (Bacteroides Uniformis) -discussed with ID and will treat with levaquin for 5 more days.  Syncope. Probable TIA CT head negative. CTA head is negative. Patient is unable to lay flat for an MRI. 2D echo completed - normal. Results below.  Carotid Dopplers--with mild ICA (1-39%); vertebral arteries with antegrade flow Has been seen by stroke team. [name] (neurologist) believes she had a TIA secondary to low blood flow. He recommends 81 mg aspirin daily.  -Started aspirin 12/17.   Inferior ST segment elevation on EKG w/o reciprocal changes. -Resolved on subsequent EKG. 2D echo is normal. Troponin cycled and are negative. -Patient without chest pain or SOB -no further intervention planned by cardiology  Vomiting / Diarrhea Nausea/vomiting resolved at discharge Loose stool significantly improved  Adynamic Ileus (12/15) -Improving clinically. Patient's bowel sounds are no longer tympanic.  -Appreciate GI consultation and rec's -Increase K++ to 4.5 - 5.0. Mobilize patient.  -will Eliminate narcotics and anticholinergics as much as possible.  Normocytic Anemia -Hgb has trended down while the patient was in the  hospital, but appears to have stabilized at approximately 10.  -Continue to monitor.  -Aspirin started 12/17 as hgb remains stable for TIA  secondary prevention.   Hypokalemia -Secondary to GI losses.  -Continue to repletion orally as needed. -At discharge K 4.0  Hypertension -On Benazepril and Labetalol at home.  -due to uncontrolled HTN now that HCT has been discontinued; will start hydralazine TID -will recommend follow up of BP and further adjustments of antihypertensive regimen as needed   Chronic pain - bilateral shoulders. -Reports she is on oxy at home -will continue low dose oxycodone for severe pain -for mild to moderate pain will use tylenol  Mild Hyponatremia (12/17) -Secondary to diarrhea and HCTZ  -Will stop HCTZ  -Repeat BMET in 5 days to follow electrolytes -at discharge NA level 132 -maintain good hydration/nutrition   Physical deconditioning -PT recommends SNF -will discharge to [facility]  Procedures/significant events: 1. 07/06/14 - EMS --> ED after found down with facial droop  2. 07/06/14 - CT head shows chronic small vessel disease but no acute process - code stroke canceled 3. 07/06/14 - EKG with ST segment elevation in inferior leads - code STEMI canceled by cardiology 4. 07/06/14 - Norepi started for shock, CCM called for admission 5. 07/11/14-- findings suggesting ischemic colitis  6. 07/10/14 carotid duplex:  - The vertebral arteries appear patent with antegrade flow. - Findings consistent with 1-39 percent stenosis involving the right internal carotid artery and the left internal carotid artery.  Consultations:  GI  CCM  Neurology   ID (Dr, Luciana Axe Curbside)  Discharge Exam: [name] Vitals:   07/14/14 1245  BP: 146/52  Pulse: 68  Temp: 98.9 F (37.2 C)  Resp: 20   General: obese, elderly female, NAD, appears stated age. Complaining of fast intestinal transit currently. No fever Cardiovascular: RRR, S1 S2 auscultated, no rubs, murmurs or gallops.  Respiratory: Clear to auscultation bilaterally with no use of accessory muscles Abdomen: Obese, no tender to  palpation. Soft, +BS, no masses. Slight distension and per patient feeling bloated Extremities: warm dry without cyanosis clubbing or edema.  Neuro: AAOx3, cranial nerves grossly intact. Able to move all extremities, speech is clear. Skin: redness across the left face (birth mark). No frank rash or bruising. Psych: Normal affect and demeanor with intact judgement and insight. Pleasant.   Discharge Instructions You were cared for by a hospitalist during your hospital stay. If you have any questions about your discharge medications or the care you received while you were in the hospital after you are discharged, you can call the unit and asked to speak with the hospitalist on call if the hospitalist that took care of you is not available. Once you are discharged, your primary care physician will handle any further medical issues. Please note that NO REFILLS for any discharge medications will be authorized once you are discharged, as it is imperative that you return to your primary care physician (or establish a relationship with a primary care physician if you do not have one) for your aftercare needs so that they can reassess your need for medications and monitor your lab values.  Discharge Instructions    Diet - low sodium heart healthy    Complete by:  As directed      Discharge instructions    Complete by:  As directed   Take medications as prescribed Maintain good hydration Physical rehabilitation as per facility protocol Follow up with PCP in 2 weeks  Current Discharge Medication List    START taking these medications   Details  acetaminophen (TYLENOL) 325 MG tablet Take 2 tablets (650 mg total) by mouth every 6 (six) hours as needed for moderate pain (headache).    aspirin 81 MG chewable tablet Chew 1 tablet (81 mg total) by mouth daily.    hydrALAZINE (APRESOLINE) 10 MG tablet Take 1 tablet (10 mg total) by mouth 3 (three) times daily.    levofloxacin (LEVAQUIN) 500  MG tablet Take 1 tablet (500 mg total) by mouth daily. To be taken for 5 days    LORazepam (ATIVAN) 1 MG tablet Take 1 tablet (1 mg total) by mouth every 8 (eight) hours. Qty: 30 tablet, Refills: 0    oxyCODONE (OXY IR/ROXICODONE) 5 MG immediate release tablet Take 1 tablet (5 mg total) by mouth every 6 (six) hours as needed for severe pain. Qty: 30 tablet, Refills: 0    saccharomyces boulardii (FLORASTOR) 250 MG capsule Take 1 capsule (250 mg total) by mouth 2 (two) times daily.    simethicone (GAS-X) 80 MG chewable tablet Chew 1 tablet (80 mg total) by mouth every 6 (six) hours as needed for flatulence (bloating). Qty: 30 tablet, Refills: 0      CONTINUE these medications which have CHANGED   Details  tiZANidine (ZANAFLEX) 4 MG tablet Take 1 tablet (4 mg total) by mouth every 8 (eight) hours as needed for muscle spasms. Qty: 30 tablet, Refills: 0      CONTINUE these medications which have NOT CHANGED   Details  albuterol (PROVENTIL) (2.5 MG/3ML) 0.083% nebulizer solution Take 2.5 mg by nebulization 4 (four) times daily as needed for wheezing or shortness of breath.    benazepril (LOTENSIN) 20 MG tablet Take 20 mg by mouth 2 (two) times daily.    Cyanocobalamin (VITAMIN B 12 PO) Take 1 tablet by mouth daily.    DULoxetine (CYMBALTA) 60 MG capsule Take 60 mg by mouth daily.    labetalol (NORMODYNE) 200 MG tablet Take 400 mg by mouth 2 (two) times daily.     meclizine (ANTIVERT) 12.5 MG tablet Take 12.5 mg by mouth 2 (two) times daily as needed for dizziness.    oxybutynin (DITROPAN) 5 MG tablet Take 10 mg by mouth daily.    pantoprazole (PROTONIX) 40 MG tablet Take 40 mg by mouth daily.    VITAMIN D, CHOLECALCIFEROL, PO Take 1 tablet by mouth daily.      STOP taking these medications     albuterol (PROVENTIL HFA;VENTOLIN HFA) 108 (90 BASE) MCG/ACT inhaler      diazepam (VALIUM) 5 MG tablet      hydrochlorothiazide (MICROZIDE) 12.5 MG capsule        Allergies   Allergen Reactions  . Adhesive [Tape]     Itching and redness around site;Pt requests paper tape  . Cephalexin Nausea And Vomiting   Follow-up Information    Follow up with [facility] BETH, PA-C. Schedule an appointment as soon as possible for a visit in 2 weeks.   Specialty:  Physician Assistant   Contact information:   [address] [place] [address] [phone]        The results of significant diagnostics from this hospitalization (including imaging, microbiology, ancillary and laboratory) are listed below for reference.    Significant Diagnostic Studies: Ct Abdomen Pelvis Wo Contrast  07/07/2014   CLINICAL DATA:  Nausea and vomiting. Lower abdominal pain. History of small bowel obstructions.  EXAM: CT ABDOMEN AND  PELVIS WITHOUT CONTRAST  TECHNIQUE: Multidetector CT imaging of the abdomen and pelvis was performed following the standard protocol without IV contrast.  COMPARISON:  01/30/2014  FINDINGS: Atelectasis in the lung bases. Coronary artery calcifications. There appears to be an enteric tube with tip in the distal esophagus. Suggest advancement of the stomach for better positioning.  The unenhanced appearance of the liver, spleen, pancreas, kidneys, inferior vena cava, and retroperitoneal lymph nodes is unremarkable. Calcification of the abdominal aorta without aneurysm. Right adrenal gland nodule measures 2.8 cm diameter. Density measurements consistent with fat. 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 No change since prior study. Surgical absence of the gallbladder. Mild bile duct dilatation is likely physiologic. Stomach appears normal. Old thickening in the small bowel suggesting enteritis. No evidence of small bowel dilatation. Colon is diffusely stool-filled with evidence of wall thickening in the transverse and descending portions. Mild pericolonic infiltration. This suggests colitis, likely of infectious or inflammatory etiology. No free air or free fluid in the abdomen.   Pelvis: Foley catheter in the bladder. Thickening of the wall of the rectosigmoid colon with pericolonic infiltration consistent with proctitis and colitis. There appears to be a balloon catheter in the rectum. Small amount of free fluid in the pelvis is probably reactive. Appendix is not identified. No pelvic mass or lymphadenopathy. Degenerative changes in the lumbar spine. No destructive bone lesions appreciated.  IMPRESSION: Wall thickening in the transverse, descending, and rectosigmoid colon consistent with nonspecific colitis, likely infectious or inflammatory. Small bowel fold thickening suggest enteritis. No evidence of small bowel obstruction. Enteric tube tip is in the distal esophagus and should be advanced if placement in the stomach is desired. Foley and rectal catheters are present.   Electronically Signed   By: Burman Nieves M.D.   On: 07/07/2014 03:29   Ct Angio Head W/cm &/or Wo Cm  07/08/2014   CLINICAL DATA:  Code stroke with LEFT brain TIA, 07/06/2014, now resolved. Slight headache.  EXAM: CT ANGIOGRAPHY HEAD  TECHNIQUE: Multidetector CT imaging of the head was performed using the standard protocol during bolus administration of intravenous contrast. Multiplanar CT image reconstructions and MIPs were obtained to evaluate the vascular anatomy.  CONTRAST:  50mL OMNIPAQUE IOHEXOL 350 MG/ML SOLN  COMPARISON:  CT head performed 07/06/2014.  FINDINGS: The RIGHT internal carotid artery displays wide patency in its upper cervical and segment. There is a 50% inferior cavernous stenosis which is calcific. Mild supraclinoid calcification also estimated 50% stenosis.  On the LEFT, there is a 50% distal cervical calcific ICA stenosis. There is also mild non stenotic cavernous and supraclinoid atherosclerotic calcification.  The basilar is widely patent with both vertebrals contributing, LEFT slightly larger. Minimal distal LEFT vertebral calcification, non stenotic.  There is no proximal stenosis of  the anterior, middle, or posterior cerebral arteries no MCA or PCA branch occlusion. No cerebellar branch occlusion. Generalized atrophy with hypoattenuation white matter representing small vessel disease is redemonstrated. No abnormal postcontrast enhancement. Major dural venous sinuses are patent.  Review of the MIP images confirms the above findings.  IMPRESSION: No flow reducing lesion of the intracranial circulation is evident.   Electronically Signed   By: Davonna Belling M.D.   On: 07/08/2014 15:27   Dg Abd 1 View  07/10/2014   CLINICAL DATA:  Ileus.  Abdominal distention.  EXAM: ABDOMEN - 1 VIEW  COMPARISON:  07/09/2014 and 07/08/2014  FINDINGS: There is increased gaseous distention of colon. However, the contrast from prior CT scan appears to have passed.  There are no dilated small bowel loops. No visible free air or free fluid on this supine radiograph.  IMPRESSION: Increased gaseous distention of the colon to a diameter of 11 cm at the hepatic flexure. The finding is consistent with progressive ileus.   Electronically Signed   By: Geanie Cooley M.D.   On: 07/10/2014 08:02   Dg Abd 1 View  07/08/2014   CLINICAL DATA:  Abdominal pain and vomiting. Evaluate nasogastric tube. Colitis.  EXAM: ABDOMEN - 1 VIEW  COMPARISON:  07/07/2014  FINDINGS: Nasogastric tube in the upper stomach region. There is oral contrast in the right colon. Right colon is dilated but similar to the recent CT. Few dilated loops of small bowel. Evidence for a rectal catheter. Mild scoliosis in lumbar spine with multilevel disc disease.  IMPRESSION: Nasogastric tube in the upper stomach region.  The oral contrast has advanced into the right colon. There continues to be dilatation of the right colon. Dilatation is probably secondary to the wall thickening in the left colon and colitis. Findings could represent at least a partial colonic obstruction.   Electronically Signed   By: Richarda Overlie M.D.   On: 07/08/2014 14:39   Ct Head Wo  Contrast  07/06/2014   CLINICAL DATA:  Found down, last seen normal at 2030 hr, altered mental status, LEFT-sided weakness and slurred speech.  EXAM: CT HEAD WITHOUT CONTRAST  TECHNIQUE: Contiguous axial images were obtained from the base of the skull through the vertex without intravenous contrast.  COMPARISON:  CT of the head March 01, 2010  FINDINGS: Mild motion degraded examination.  The ventricles and sulci are normal for age. No intraparenchymal hemorrhage, mass effect nor midline shift. Patchy supratentorial and pontine white matter hypodensities are within normal range for patient's age and though non-specific suggest sequelae of chronic small vessel ischemic disease. No acute large vascular territory infarcts.  No abnormal extra-axial fluid collections. Basal cisterns are patent. Moderate calcific atherosclerosis of the carotid siphons and included vertebra arteries.  No skull fracture. The included ocular globes and orbital contents are non-suspicious. Status post bilateral ocular lens implants. RIGHT sphenoid mucosal retention cyst without paranasal sinus air-fluid levels. Patient is edentulous. Soft tissue within the external auditory canals most consistent with cerumen. 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  IMPRESSION: No acute intracranial process on this mildly motion degraded examination.  Moderate to severe white matter changes suggest chronic small vessel ischemic disease. If clinical concern persists for acute ischemia, MRI of the brain with diffusion-weighted sequences would be more sensitive.  Acute findings discussed with and reconfirmed by Dr.Kirkpatrick on 07/06/2014 at 9:55 pm.   Electronically Signed   By: Awilda Metro   On: 07/06/2014 22:05   Dg Chest Portable 1 View  07/07/2014   CLINICAL DATA:  Central line placement.  EXAM: PORTABLE CHEST - 1 VIEW  COMPARISON:  Chest radiograph July 06, 2014  FINDINGS: Interval placement of LEFT subclavian central venous  catheter with distal tip projecting in mid superior vena cava. No pneumothorax.  The cardiac silhouette is upper limits of normal in size. Mediastinal silhouette is nonsuspicious, mildly calcified aortic knob. Strandy density persists in LEFT lung base. No pleural effusion. RIGHT humeral arthroplasty. Severe degenerative change of the LEFT shoulder, LEFT distal clavicle osteotomy.  IMPRESSION: Interval placement of LEFT subclavian central venous catheter, distal tip projecting in mid superior vena cava. No pneumothorax.  Stable cardiomegaly and LEFT lung base atelectasis.   Electronically Signed   By: Awilda Metro  On: 07/07/2014 01:17   Dg Chest Port 1 View  07/06/2014   CLINICAL DATA:  Hypotension, history of COPD, hypertension, shortness of breath and pneumonia.  EXAM: PORTABLE CHEST - 1 VIEW  COMPARISON:  Chest radiograph January 30, 2014  FINDINGS: Cardiac silhouette is upper limits of normal in size. Tortuous, mildly calcified aorta. Strandy densities LEFT lung base. No pleural effusion or focal consolidation. No pneumothorax. Edit  Coil densities in the neck are unchanged, and may be vascular. Status post RIGHT humeral arthroplasty. Bilateral distal clavicle resection, severe degenerative change LEFT shoulder.  IMPRESSION: Borderline cardiomegaly. LEFT lung base atelectasis versus scarring.   Electronically Signed   By: Awilda Metroourtnay  Bloomer   On: 07/06/2014 22:51   Dg Abd 2 Views  07/09/2014   CLINICAL DATA:  Abdomen pain and distention. Check NG tube and follow progression of colonic contrast. History of colitis  EXAM: ABDOMEN - 2 VIEW  COMPARISON:  the previous day's study  FINDINGS: Nasogastric tube extends to the gastric fundus. Interval clearance of most of the previously noted colonic contrast material, a small residual in the proximal descending segment. There a few gas distended small bowel loops. Multiple fluid levels in the small bowel and proximal colon on decubitus radiographs indicating  liquid content. No free air.  Spondylitic changes throughout the lumbar spine. Bilateral pelvic vascular calcifications.  IMPRESSION: 1. Fluid levels in small bowel and proximal colon suggesting adynamic ileus.   Electronically Signed   By: Oley Balmaniel  Hassell M.D.   On: 07/09/2014 11:23   Dg Abd Portable 1v  07/07/2014   CLINICAL DATA:  Abdominal pain.  NG tube placement.  EXAM: PORTABLE ABDOMEN - 1 VIEW  COMPARISON:  01/31/2014  FINDINGS: The enteric tube tip projects over the EG junction region with proximal side hole projected over the lower esophagus. Appearance consistent with placement in the distal esophagus. Residual contrast material in the stomach and small bowel.  IMPRESSION: Enteric tube tip projects over the distal esophagus. Advancement to the stomach is recommended for better placement.   Electronically Signed   By: Burman NievesWilliam  Stevens M.D.   On: 07/07/2014 02:52    Microbiology: Recent Results (from the past 240 hour(s))  Blood Culture (routine x 2)     Status: None   Collection Time: 07/06/14 10:24 PM  Result Value Ref Range Status   Specimen Description BLOOD LEFT HAND  Final   Special Requests BOTTLES DRAWN AEROBIC ONLY 3CC  Final   Culture  Setup Time   Final    07/07/2014 10:18 Performed at [facility]    Culture   Final    NO GROWTH 5 DAYS Performed at [facility]    Report Status 07/13/2014 FINAL  Final  Blood Culture (routine x 2)     Status: None   Collection Time: 07/06/14 10:31 PM  Result Value Ref Range Status   Specimen Description BLOOD RIGHT ANTECUBITAL  Final   Special Requests BOTTLES DRAWN AEROBIC AND ANAEROBIC 5CC EA  Final   Culture  Setup Time   Final    07/07/2014 10:18 Performed at [facility]    Culture   Final    BACTEROIDES UNIFORMIS Note: BETA LACTAMASE POSITIVE Note: Gram Stain Report Called to,Read Back By and Verified With: ANNE LOWE ON 07/10/2014 AT 12:14A BY WILEJ Performed at [facility]    Report  Status 07/12/2014 FINAL  Final  Clostridium Difficile by PCR     Status: None   Collection Time: 07/07/14 12:52  AM  Result Value Ref Range Status   C difficile by pcr NEGATIVE NEGATIVE Final  Urine culture     Status: None   Collection Time: 07/07/14 12:53 AM  Result Value Ref Range Status   Specimen Description URINE, CATHETERIZED  Final   Special Requests URINE, RANDOM  Final   Culture  Setup Time   Final    07/07/2014 11:15 Performed at [facility]    Colony Count NO GROWTH Performed at [facility]   Final   Culture NO GROWTH Performed at [facility]   Final   Report Status 07/08/2014 FINAL  Final  MRSA PCR Screening     Status: Abnormal   Collection Time: 07/07/14  3:43 AM  Result Value Ref Range Status   MRSA by PCR POSITIVE (A) NEGATIVE Final    Comment:        The GeneXpert MRSA Assay (FDA approved for NASAL specimens only), is one component of a comprehensive MRSA colonization surveillance program. It is not intended to diagnose MRSA infection nor to guide or monitor treatment for MRSA infections. RESULT CALLED TO, READ BACK BY AND VERIFIED WITH: [facility] RN [id number] 07/07/14 MITCHELL,L   Stool culture     Status: None   Collection Time: 07/07/14 12:00 PM  Result Value Ref Range Status   Specimen Description STOOL  Final   Special Requests NONE  Final   Culture   Final    NO SALMONELLA, SHIGELLA, CAMPYLOBACTER, YERSINIA, OR E.COLI [id number] ISOLATED Note: REDUCED NORMAL FLORA PRESENT Performed at [facility]    Report Status 07/10/2014 FINAL  Final  Clostridium Difficile by PCR     Status: None   Collection Time: 07/07/14 12:57 PM  Result Value Ref Range Status   C difficile by pcr NEGATIVE NEGATIVE Final  Cath Tip Culture     Status: None   Collection Time: 07/10/14  2:15 PM  Result Value Ref Range Status   Specimen Description CATH TIP  Final   Special Requests LEFT CVC  Final   Culture   Final    NO GROWTH 3  DAYS Performed at [facility]    Report Status 07/13/2014 FINAL  Final     Labs: Basic Metabolic Panel:  Recent Labs Lab 07/08/14 1855 07/09/14 0625 07/10/14 0522 07/11/14 0645 07/12/14 0630 07/13/14 0447  NA  --  135* 134* 130* 130* 132*  K  --  3.1* 4.0 3.9 3.2* 4.0  CL  --  100 100 97 93* 95*  CO2  --  25 23 24 28 26   GLUCOSE  --  92 88 96 106* 106*  BUN  --  8 7 5* 3* 4*  CREATININE  --  0.53 0.49* 0.43* 0.48* 0.47*  CALCIUM  --  8.8 8.7 8.8 8.7 8.6  MG 2.1  --   --   --   --   --    Liver Function Tests:  Recent Labs Lab 07/08/14 0815  AST 14  ALT 8  ALKPHOS 69  BILITOT 0.5  PROT 5.6*  ALBUMIN 2.5*   CBC:  Recent Labs Lab 07/08/14 0815 07/09/14 0625 07/10/14 0522 07/12/14 0630 07/13/14 0447  WBC 6.8 5.8 5.6 7.7 7.7  NEUTROABS 5.1  --   --   --   --   HGB 11.8* 10.4* 10.2* 10.6* 10.9*  HCT 35.3* 31.5* 30.7* 32.1* 32.3*  MCV 86.5 86.8 87.5 86.1 88.0  PLT 263 244 246 267 284   Cardiac  Enzymes:  Recent Labs Lab 07/08/14 1855 07/09/14 0625  TROPONINI <0.30 <0.30   CBG:  Recent Labs Lab 07/13/14 1142 07/13/14 1737 07/13/14 2234 07/14/14 0739 07/14/14 1131  GLUCAP 124* 107* 146* 95 108*    Signed:  Vassie LollMadera, Charday Capetillo  [facility] 07/14/2014, 2:04 PM

## 2014-07-14 NOTE — Progress Notes (Signed)
[facility] [place] at this time.

## 2014-07-14 NOTE — Clinical Social Work Note (Signed)
CSW made aware patient ready for d/c to [facility] by Ginger, charge RN. CSW contacted Eunice BlaseDebbie of [facility] who states she has to confirm bed availability with facility. Per Eunice Blaseebbie, she is uncertain of female bed availability at this time. CSW pending follow-up from [place]. Ginger, charge RN made aware.   Veronica Key, LCSWA Weekend Clinical Social Worker [phone]

## 2014-07-14 NOTE — Clinical Social Work Note (Signed)
CSW made aware by Leane Callebbie of [facility], facility has no female bed available. CSW contacted [facility] and spoke with Marylene LandAngela. Per Marylene LandAngela, facility has female bed and can admit patient after 4pm on this date. CSW to meet with patient and make her aware of the above.   Weylin Plagge Patrick-Jefferson, LCSWA Weekend Clinical Social Worker [phone]

## 2014-07-14 NOTE — Clinical Social Work Note (Signed)
CSW met with patient and made her aware [facility] does not have a bed available. CSW made patient aware [facility]) has a bed available. Patient is agreeable to d/c to [facility]. Patient states her son is planning and hosting a Christmas party on this date, so he cannot be reached. Patient states she will contact son on Monday and make him aware of d/c to facility. CSW to fax d/c summary to [facility] and arrange transportation via [place] for 4pm on this date. CSW currently pending d/c summary. Precious Bard, RN, and Ginger, charge RN made aware.   [place], [place] Weekend Clinical Social Worker [phone]

## 2014-07-14 NOTE — Clinical Social Work Note (Signed)
CSW faxed d/c summary to facility. CSW spoke with Humphrey RollsMarlowe and confirmed receipt of fax. CSW prepared d/c packet and placed in patient's shadow chart. CSW provided RN Clayborne Danaatti, with number for report: [phone] and room:[phone]. CSW to arrange PTAR for 4pm. No further needs. CSW signing off.  Colletta Spillers Patrick-Jefferson, LCSWA Weekend Clinical Social Worker [phone]

## 2014-09-04 ENCOUNTER — Other Ambulatory Visit: Payer: Self-pay | Admitting: Family Medicine

## 2014-09-04 ENCOUNTER — Ambulatory Visit (INDEPENDENT_AMBULATORY_CARE_PROVIDER_SITE_OTHER): Payer: Medicare Other | Admitting: Physician Assistant

## 2014-09-04 ENCOUNTER — Encounter: Payer: Self-pay | Admitting: Physician Assistant

## 2014-09-04 VITALS — BP 106/50 | HR 56 | Temp 97.7°F | Resp 18

## 2014-09-04 DIAGNOSIS — J439 Emphysema, unspecified: Secondary | ICD-10-CM

## 2014-09-04 DIAGNOSIS — G459 Transient cerebral ischemic attack, unspecified: Secondary | ICD-10-CM

## 2014-09-04 DIAGNOSIS — F411 Generalized anxiety disorder: Secondary | ICD-10-CM

## 2014-09-04 DIAGNOSIS — F329 Major depressive disorder, single episode, unspecified: Secondary | ICD-10-CM

## 2014-09-04 DIAGNOSIS — F32A Depression, unspecified: Secondary | ICD-10-CM

## 2014-09-04 DIAGNOSIS — N318 Other neuromuscular dysfunction of bladder: Secondary | ICD-10-CM

## 2014-09-04 DIAGNOSIS — R5383 Other fatigue: Secondary | ICD-10-CM

## 2014-09-04 DIAGNOSIS — I10 Essential (primary) hypertension: Secondary | ICD-10-CM

## 2014-09-04 DIAGNOSIS — E1165 Type 2 diabetes mellitus with hyperglycemia: Secondary | ICD-10-CM

## 2014-09-04 DIAGNOSIS — I471 Supraventricular tachycardia: Secondary | ICD-10-CM

## 2014-09-04 LAB — CBC WITH DIFFERENTIAL/PLATELET
BASOS PCT: 1 % (ref 0–1)
Basophils Absolute: 0.1 10*3/uL (ref 0.0–0.1)
EOS ABS: 0.3 10*3/uL (ref 0.0–0.7)
Eosinophils Relative: 4 % (ref 0–5)
HEMATOCRIT: 33.3 % — AB (ref 36.0–46.0)
HEMOGLOBIN: 10.9 g/dL — AB (ref 12.0–15.0)
LYMPHS ABS: 1.4 10*3/uL (ref 0.7–4.0)
Lymphocytes Relative: 17 % (ref 12–46)
MCH: 28.5 pg (ref 26.0–34.0)
MCHC: 32.7 g/dL (ref 30.0–36.0)
MCV: 87.2 fL (ref 78.0–100.0)
MPV: 10.3 fL (ref 8.6–12.4)
Monocytes Absolute: 0.6 10*3/uL (ref 0.1–1.0)
Monocytes Relative: 8 % (ref 3–12)
NEUTROS ABS: 5.6 10*3/uL (ref 1.7–7.7)
NEUTROS PCT: 70 % (ref 43–77)
Platelets: 294 10*3/uL (ref 150–400)
RBC: 3.82 MIL/uL — AB (ref 3.87–5.11)
RDW: 14.6 % (ref 11.5–15.5)
WBC: 8 10*3/uL (ref 4.0–10.5)

## 2014-09-04 LAB — COMPLETE METABOLIC PANEL WITH GFR
ALBUMIN: 3.8 g/dL (ref 3.5–5.2)
ALT: 9 U/L (ref 0–35)
AST: 11 U/L (ref 0–37)
Alkaline Phosphatase: 82 U/L (ref 39–117)
BUN: 17 mg/dL (ref 6–23)
CO2: 24 meq/L (ref 19–32)
Calcium: 10 mg/dL (ref 8.4–10.5)
Chloride: 100 mEq/L (ref 96–112)
Creat: 0.72 mg/dL (ref 0.50–1.10)
GFR, EST NON AFRICAN AMERICAN: 84 mL/min
GLUCOSE: 89 mg/dL (ref 70–99)
POTASSIUM: 4.1 meq/L (ref 3.5–5.3)
SODIUM: 133 meq/L — AB (ref 135–145)
TOTAL PROTEIN: 6.2 g/dL (ref 6.0–8.3)
Total Bilirubin: 0.5 mg/dL (ref 0.2–1.2)

## 2014-09-04 LAB — HEMOGLOBIN A1C
Hgb A1c MFr Bld: 5.3 % (ref <5.7)
MEAN PLASMA GLUCOSE: 105 mg/dL (ref <117)

## 2014-09-04 LAB — TSH: TSH: 0.635 u[IU]/mL (ref 0.350–4.500)

## 2014-09-04 MED ORDER — OXYCODONE HCL 5 MG PO TABS: 5.0000 mg | ORAL_TABLET | Freq: Four times a day (QID) | ORAL | Status: AC | PRN

## 2014-09-04 MED ORDER — OXYBUTYNIN CHLORIDE 5 MG PO TABS: 10.0000 mg | ORAL_TABLET | Freq: Every day | ORAL | Status: AC

## 2014-09-04 MED ORDER — TIZANIDINE HCL 4 MG PO TABS: 4.0000 mg | ORAL_TABLET | Freq: Three times a day (TID) | ORAL | Status: AC | PRN

## 2014-09-04 NOTE — Progress Notes (Signed)
Patient ID: Veronica Key MRN: [id number], DOB: 1940-10-09, 74 y.o. Date of Encounter: @  Chief Complaint:  Chief Complaint  Patient presents with  . new pt, multiple complaints    just discharged from nursing home    HPI: 74 y.o. year old female  presents with a female, who they report is a hired caregiver.   Patient is in a wheelchair and is here with this caregiver. They report that a caregiver is with her 6 hours per day.  She is here as a new patient to establish care with our office today.  She has had recent hospitalizations and nursing home stay.  She says that in the past her PCP was Dr. Sudie Bailey prior to the hospitalization for nursing home placement.  They state that she does not have any oxycodone or Zanaflex and is out of these and needs prescriptions for these today.  No other specific complaints or concerns voiced today.  She does make mention that her blood pressure was low today. Was just checked yesterday and was 119/70's. Says that it was good while she was at the [facility]. Also patient makes mention of wanting to check to see if she has diabetes. I told her that I see an A1c in the computer from the hospital 07/09/14. Asked if she was on medications for diabetes prior to that hospitalization and she says no.  Of note, when I ask her if she is fasting this morning she says no that she has had biscuit with a cheese and sausage.  Past Medical History  Diagnosis Date  . GERD (gastroesophageal reflux disease)     takes Protonix daily  . Urinary frequency     takes Ditropan daily  . Depression     takes Cymbalta daily  . Anxiety     takes Valium daily  . COPD (chronic obstructive pulmonary disease)     uses ALbuterol prn daily  . Hypertension     takes Amlodipine,Benazepril,and Labetalol daily  . Shortness of breath     with exertion  . History of bronchitis     last time in 1976  . Pneumonia     hx of-many yrs ago  . Headache(784.0)    occasionally  . Numbness     both feet  . Arthritis   . Joint pain   . Peripheral edema     but not on any meds  . Chronic back pain     herniated disc and stenosis  . Carpal tunnel syndrome   . H/O hiatal hernia   . History of gastric ulcer   . Urinary urgency   . History of kidney stones   . Asthma   . COPD (chronic obstructive pulmonary disease)   . Bowel obstruction 07/08/2014  . Diabetes mellitus without complication      Home Meds: Outpatient Prescriptions Prior to Visit  Medication Sig Dispense Refill  . acetaminophen (TYLENOL) 325 MG tablet Take 2 tablets (650 mg total) by mouth every 6 (six) hours as needed for moderate pain (headache).    Marland Kitchen albuterol (PROVENTIL) (2.5 MG/3ML) 0.083% nebulizer solution Take 2.5 mg by nebulization 4 (four) times daily as needed for wheezing or shortness of breath.    . benazepril (LOTENSIN) 20 MG tablet Take 20 mg by mouth 2 (two) times daily.    . Cyanocobalamin (VITAMIN B 12 PO) Take 1 tablet by mouth daily.    . DULoxetine (CYMBALTA) 60 MG capsule Take 60 mg by mouth  daily.    . hydrALAZINE (APRESOLINE) 10 MG tablet Take 1 tablet (10 mg total) by mouth 3 (three) times daily.    Marland Kitchen. labetalol (NORMODYNE) 200 MG tablet Take 400 mg by mouth 2 (two) times daily.     Marland Kitchen. LORazepam (ATIVAN) 1 MG tablet Take 1 tablet (1 mg total) by mouth every 8 (eight) hours. 30 tablet 0  . meclizine (ANTIVERT) 12.5 MG tablet Take 12.5 mg by mouth 2 (two) times daily as needed for dizziness.    Marland Kitchen. VITAMIN D, CHOLECALCIFEROL, PO Take 1 tablet by mouth daily.    Marland Kitchen. tiZANidine (ZANAFLEX) 4 MG tablet Take 1 tablet (4 mg total) by mouth every 8 (eight) hours as needed for muscle spasms. 30 tablet 0  . aspirin 81 MG chewable tablet Chew 1 tablet (81 mg total) by mouth daily. (Patient not taking: Reported on 09/04/2014)    . saccharomyces boulardii (FLORASTOR) 250 MG capsule Take 1 capsule (250 mg total) by mouth 2 (two) times daily. (Patient not taking: Reported on  09/04/2014)    . simethicone (GAS-X) 80 MG chewable tablet Chew 1 tablet (80 mg total) by mouth every 6 (six) hours as needed for flatulence (bloating). (Patient not taking: Reported on 09/04/2014) 30 tablet 0  . oxybutynin (DITROPAN) 5 MG tablet Take 10 mg by mouth daily.    Marland Kitchen. oxyCODONE (OXY IR/ROXICODONE) 5 MG immediate release tablet Take 1 tablet (5 mg total) by mouth every 6 (six) hours as needed for severe pain. 30 tablet 0  . pantoprazole (PROTONIX) 40 MG tablet Take 40 mg by mouth daily.     No facility-administered medications prior to visit.    Allergies:  Allergies  Allergen Reactions  . Adhesive [Tape]     Itching and redness around site;Pt requests paper tape  . Cephalexin Nausea And Vomiting    History   Social History  . Marital Status: Widowed    Spouse Name: N/A  . Number of Children: N/A  . Years of Education: N/A   Occupational History  . Not on file.   Social History Main Topics  . Smoking status: Current Every Day Smoker -- 0.50 packs/day for 35 years  . Smokeless tobacco: Never Used  . Alcohol Use: No  . Drug Use: No  . Sexual Activity: Yes    Birth Control/ Protection: Surgical   Other Topics Concern  . Not on file   Social History Narrative    Family History  Problem Relation Age of Onset  . Diabetes Mother   . Hypertension Mother   . Arthritis Father   . Heart disease Father   . Arthritis Brother   . Early death Son   . Arthritis Brother   . Arthritis Brother      Review of Systems:  See HPI for pertinent ROS. All other ROS negative.    Physical Exam: Blood pressure 106/50, pulse 56, temperature 97.7 F (36.5 C), temperature source Oral, resp. rate 18, height 0' (0 m), weight 0 lb (0 kg)., Cannot calculate BMI with a height equal to zero. General: Obese Female. Sitting in Wheelchair. Appears in no acute distress.  Neck: Supple. No thyromegaly. No lymphadenopathy. Lungs: Clear bilaterally to auscultation without wheezes, rales, or  rhonchi. Breathing is unlabored. Heart: RRR. III/VI murmur.  Abdomen: Soft, non-tender, non-distended with normoactive bowel sounds. No hepatomegaly. No rebound/guarding. No obvious abdominal masses. Musculoskeletal:  Strength and tone normal for age. Extremities/Skin: Warm and dry.  No edema.  Neuro: Alert and oriented X  3. Moves all extremities spontaneously.  CNII-XII grossly in tact. Psych:  Responds to questions appropriately with a normal affect.     ASSESSMENT AND PLAN:  74 y.o. year old female with  1. Transient cerebral ischemia, unspecified transient cerebral ischemia type  2. Essential hypertension - COMPLETE METABOLIC PANEL WITH GFR  3. Paroxysmal SVT (supraventricular tachycardia)  4. Pulmonary emphysema, unspecified emphysema type  5. OVERACTIVE BLADDER  6. Type 2 diabetes mellitus with hyperglycemia - Hemoglobin A1c  7. Anxiety state  8. Depression  9. Other fatigue - CBC with Differential/Platelet - COMPLETE METABOLIC PANEL WITH GFR - TSH  LIPIDS Lipid panel at the hospital 07/09/14 showed LDL very low at 32. I'm not sure whether her medications have been adjusted since that or not. Is considering rechecking a lipid panel today but she says that she is not fasting. Says that she has eaten a biscuit with eggs,  cheese and sausage.  Printed one prescription of her oxycodone and Zanaflex. We'll check labs to monitor medications. We'll have her schedule another office visit here in the next 1-2 weeks----recommend that she schedule this with the physician as she has had multiple serious medical problems during recent hospitalizations.  [address] [place], [place], [facility] 09/04/2014 1:58 PM

## 2014-09-07 ENCOUNTER — Other Ambulatory Visit: Payer: Self-pay | Admitting: Family Medicine

## 2014-09-10 MED ORDER — DULOXETINE HCL 60 MG PO CPEP: 60.0000 mg | ORAL_CAPSULE | Freq: Every day | ORAL | Status: AC

## 2014-09-10 MED ORDER — LORAZEPAM 1 MG PO TABS: 1.0000 mg | ORAL_TABLET | Freq: Three times a day (TID) | ORAL | Status: AC | PRN

## 2014-09-10 NOTE — Telephone Encounter (Addendum)
Home health nurse at patient home.  Pt out of Cymbalta, Ativan and Pharm has req refill of Benazepril.  Was new pt last week.  Has follow up appt next week with MD.  One month refills both sent to pharmacy.  #30 Ativan called in.  [facility] her request.

## 2014-09-10 NOTE — Telephone Encounter (Signed)
Agree. Approved. 

## 2014-09-11 ENCOUNTER — Telehealth: Payer: Self-pay | Admitting: *Deleted

## 2014-09-11 NOTE — Telephone Encounter (Signed)
Received request from pharmacy for PA on Ativan.   PA submitted.   Dx: Anxiety (F41.1).

## 2014-09-11 NOTE — Telephone Encounter (Signed)
Ativan has been approved through [employer] from 08/12/2014 to 09/11/2015  Case ID [id number]

## 2014-09-12 ENCOUNTER — Telehealth: Payer: Self-pay | Admitting: *Deleted

## 2014-09-12 NOTE — Telephone Encounter (Signed)
[place] call from [place] with [facility].   Reports that patient was found in apartment deceased on .  MD made aware.

## 2014-09-13 ENCOUNTER — Telehealth: Payer: Self-pay | Admitting: Family Medicine

## 2014-09-13 NOTE — Telephone Encounter (Signed)
Patients son Genevie Cheshirebilly is calling to let you know that patient passed away [phone]

## 2014-09-13 NOTE — Telephone Encounter (Signed)
[facility] as [facility] to [facility], I never saw pt

## 2014-09-18 ENCOUNTER — Ambulatory Visit: Payer: Medicare Other | Admitting: Family Medicine

## 2014-09-24 DEATH — deceased

## 2015-11-02 IMAGING — CT CT ABD-PELV W/O CM
2 of 4 series · 5 of 46 positions shown, 7 images · non-contrast
Comparison: 01/30/2014

CLINICAL DATA: Nausea and vomiting. Lower abdominal pain. History
of small bowel obstructions.

EXAM:
CT ABDOMEN AND PELVIS WITHOUT CONTRAST
TECHNIQUE: Multidetector CT imaging of the abdomen and pelvis was performed
following the standard protocol without IV contrast.

[Series 206: coronals · coronal · 0.50mm/px · 4 of 141 slices shown, 5 images]
[im 32/141  soft-tissue]
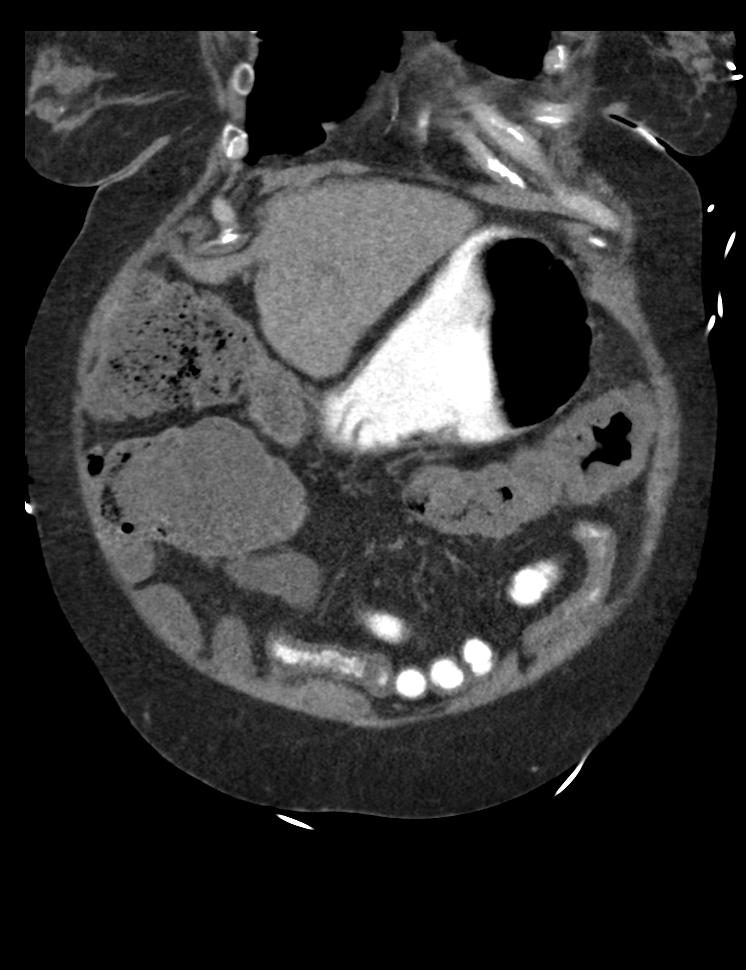
[im 32/141  bone]
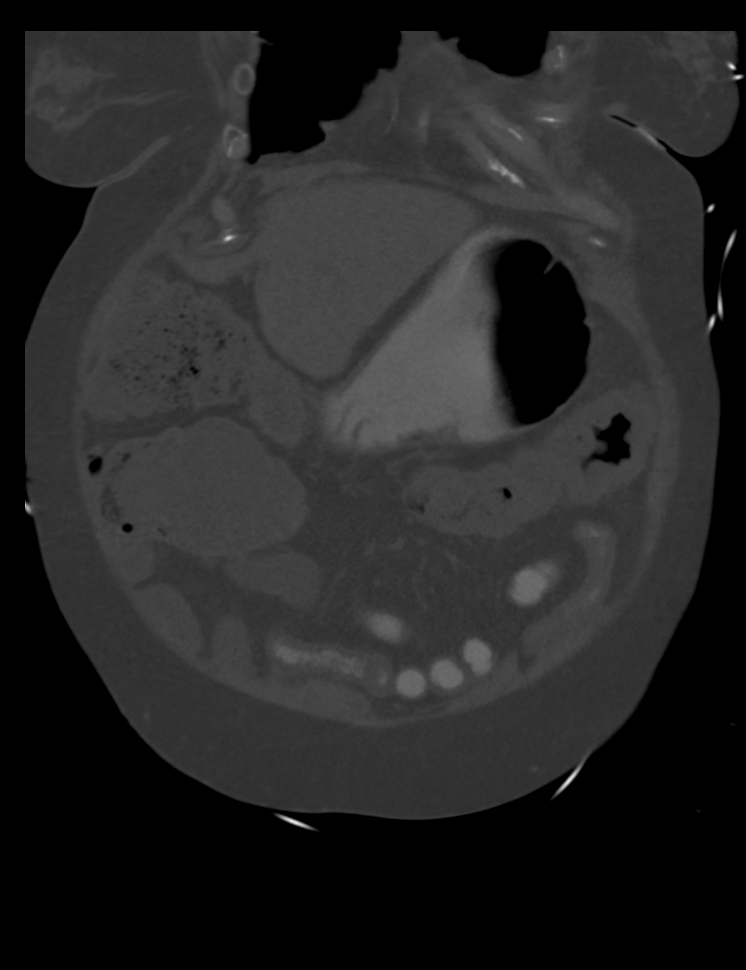
[im 63/141  soft-tissue]
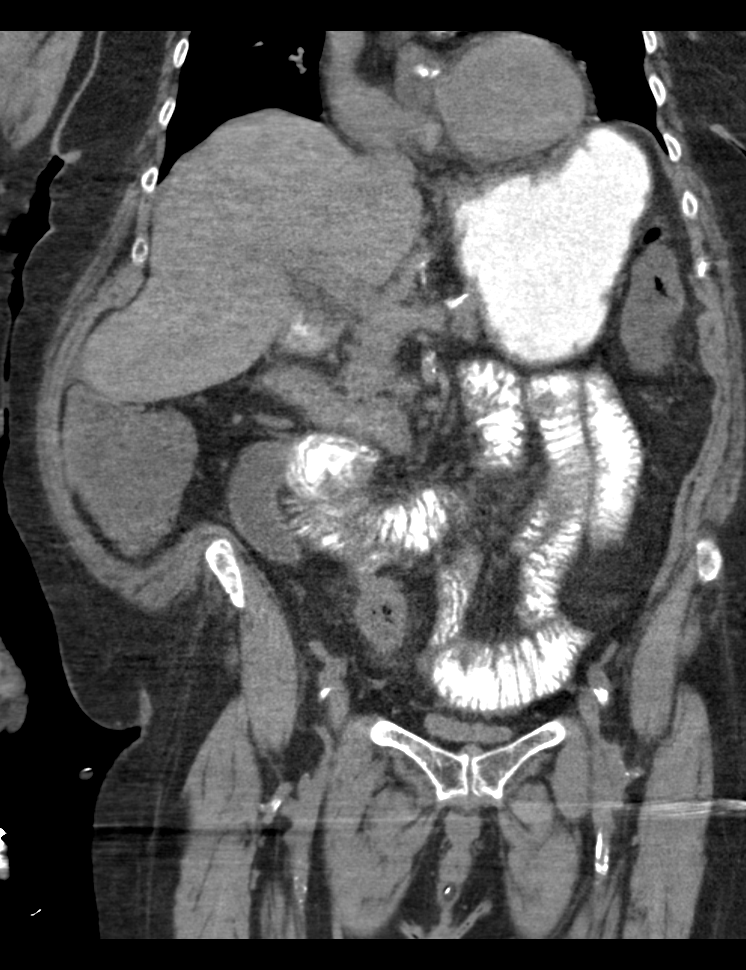
[im 94/141  soft-tissue]
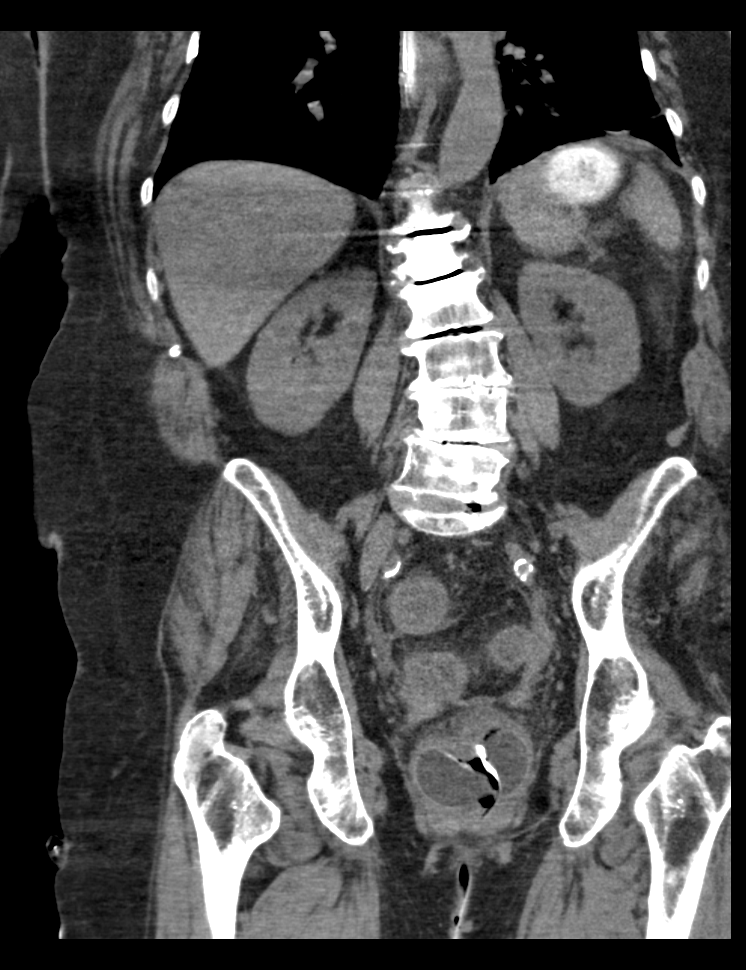
[im 125/141  soft-tissue]
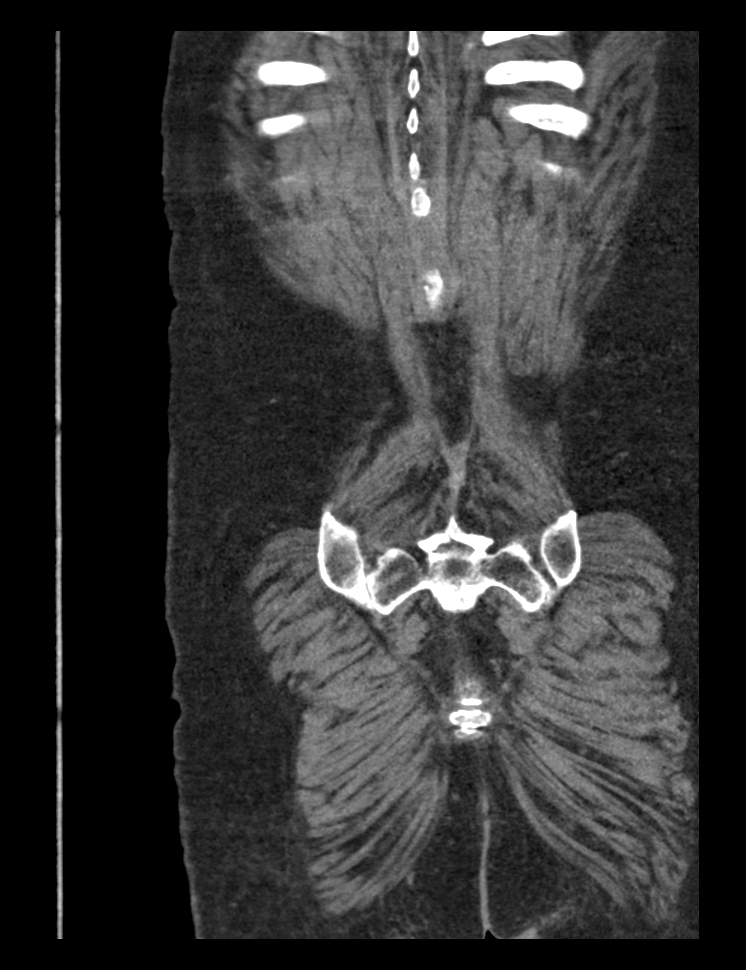

[Series 207: sagittals · sagittal · 0.50mm/px · 1 of 182 slices shown, 2 images]
[im 61/182  soft-tissue]
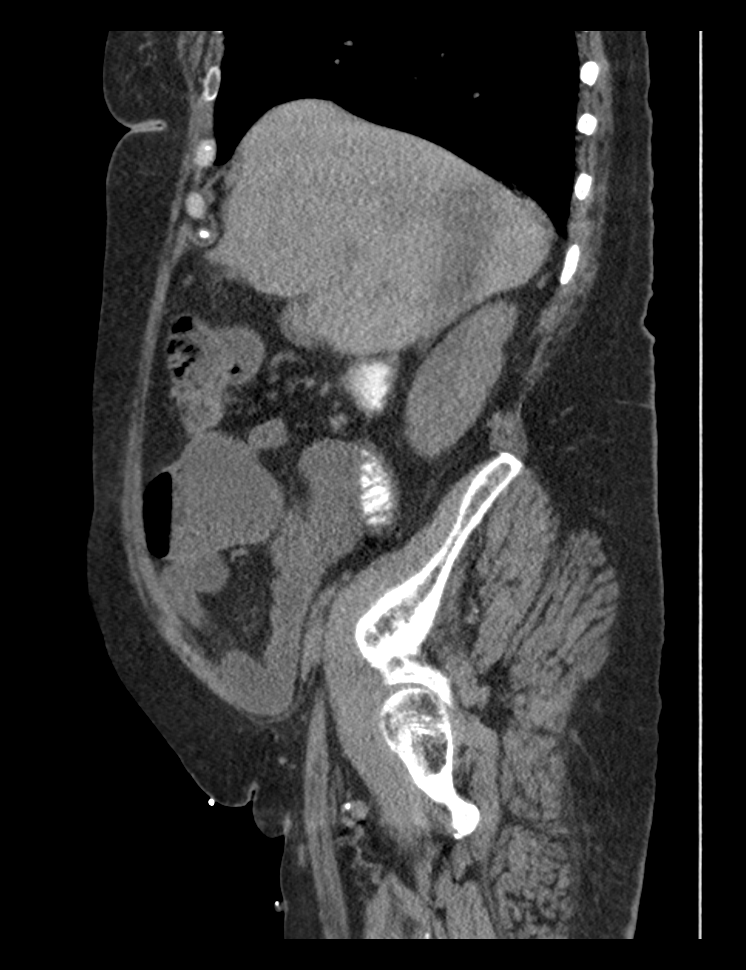
[im 61/182  bone]
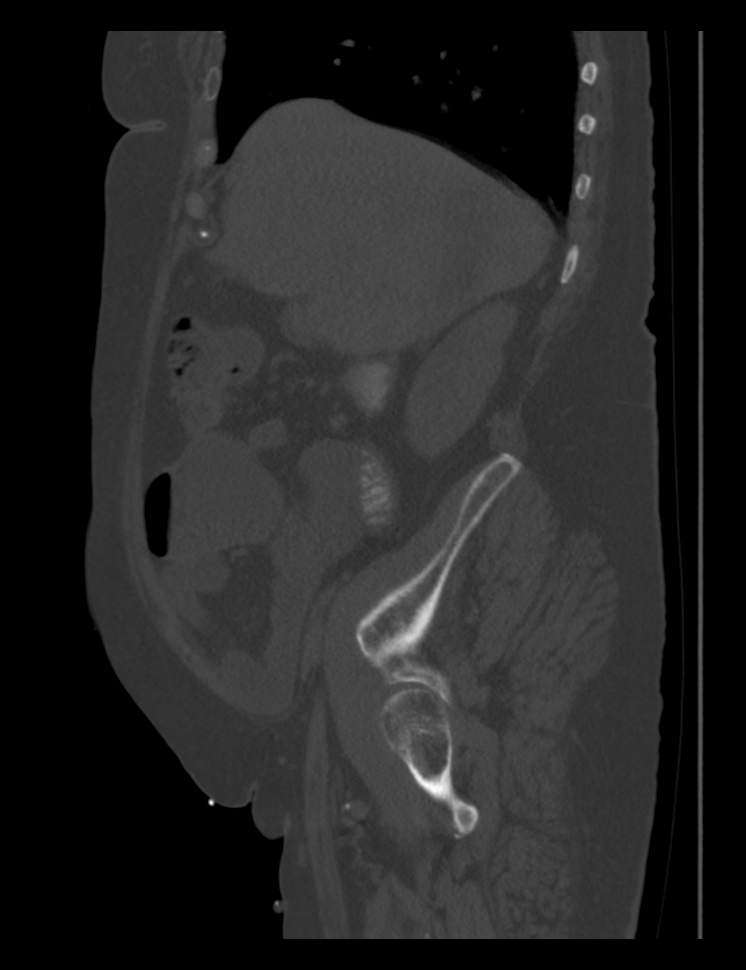

[5 of 46 positions shown; findings below may reference images not displayed]

FINDINGS: Atelectasis in the lung bases. Coronary artery calcifications. There
appears to be an enteric tube with tip in the distal esophagus.
Suggest advancement of the stomach for better positioning.

The unenhanced appearance of the liver, spleen, pancreas, kidneys,
inferior vena cava, and retroperitoneal lymph nodes is unremarkable.
Calcification of the abdominal aorta without aneurysm. Right adrenal
gland nodule measures 2.8 cm diameter. Density measurements
consistent with fat. This is likely an adenoma. No change since
prior study. Surgical absence of the gallbladder. Mild bile duct
dilatation is likely physiologic. Stomach appears normal. Old
thickening in the small bowel suggesting enteritis. No evidence of
small bowel dilatation. Colon is diffusely stool-filled with
evidence of wall thickening in the transverse and descending
portions. Mild pericolonic infiltration. This suggests colitis,
likely of infectious or inflammatory etiology. No free air or free
fluid in the abdomen.

Pelvis: Foley catheter in the bladder. Thickening of the wall of the
rectosigmoid colon with pericolonic infiltration consistent with
proctitis and colitis. There appears to be a balloon catheter in the
rectum. Small amount of free fluid in the pelvis is probably
reactive. Appendix is not identified. No pelvic mass or
lymphadenopathy. Degenerative changes in the lumbar spine. No
destructive bone lesions appreciated.
IMPRESSION: Wall thickening in the transverse, descending, and rectosigmoid
colon consistent with nonspecific colitis, likely infectious or
inflammatory. Small bowel fold thickening suggest enteritis. No
evidence of small bowel obstruction. Enteric tube tip is in the
distal esophagus and should be advanced if placement in the stomach
is desired. Foley and rectal catheters are present.

## 2015-11-03 IMAGING — CR DG ABDOMEN 1V
1 series · 1 of 1 positions shown · non-contrast
Comparison: 07/07/2014

CLINICAL DATA: Abdominal pain and vomiting. Evaluate nasogastric
tube. Colitis.

EXAM:
ABDOMEN - 1 VIEW

[abdomen supine]
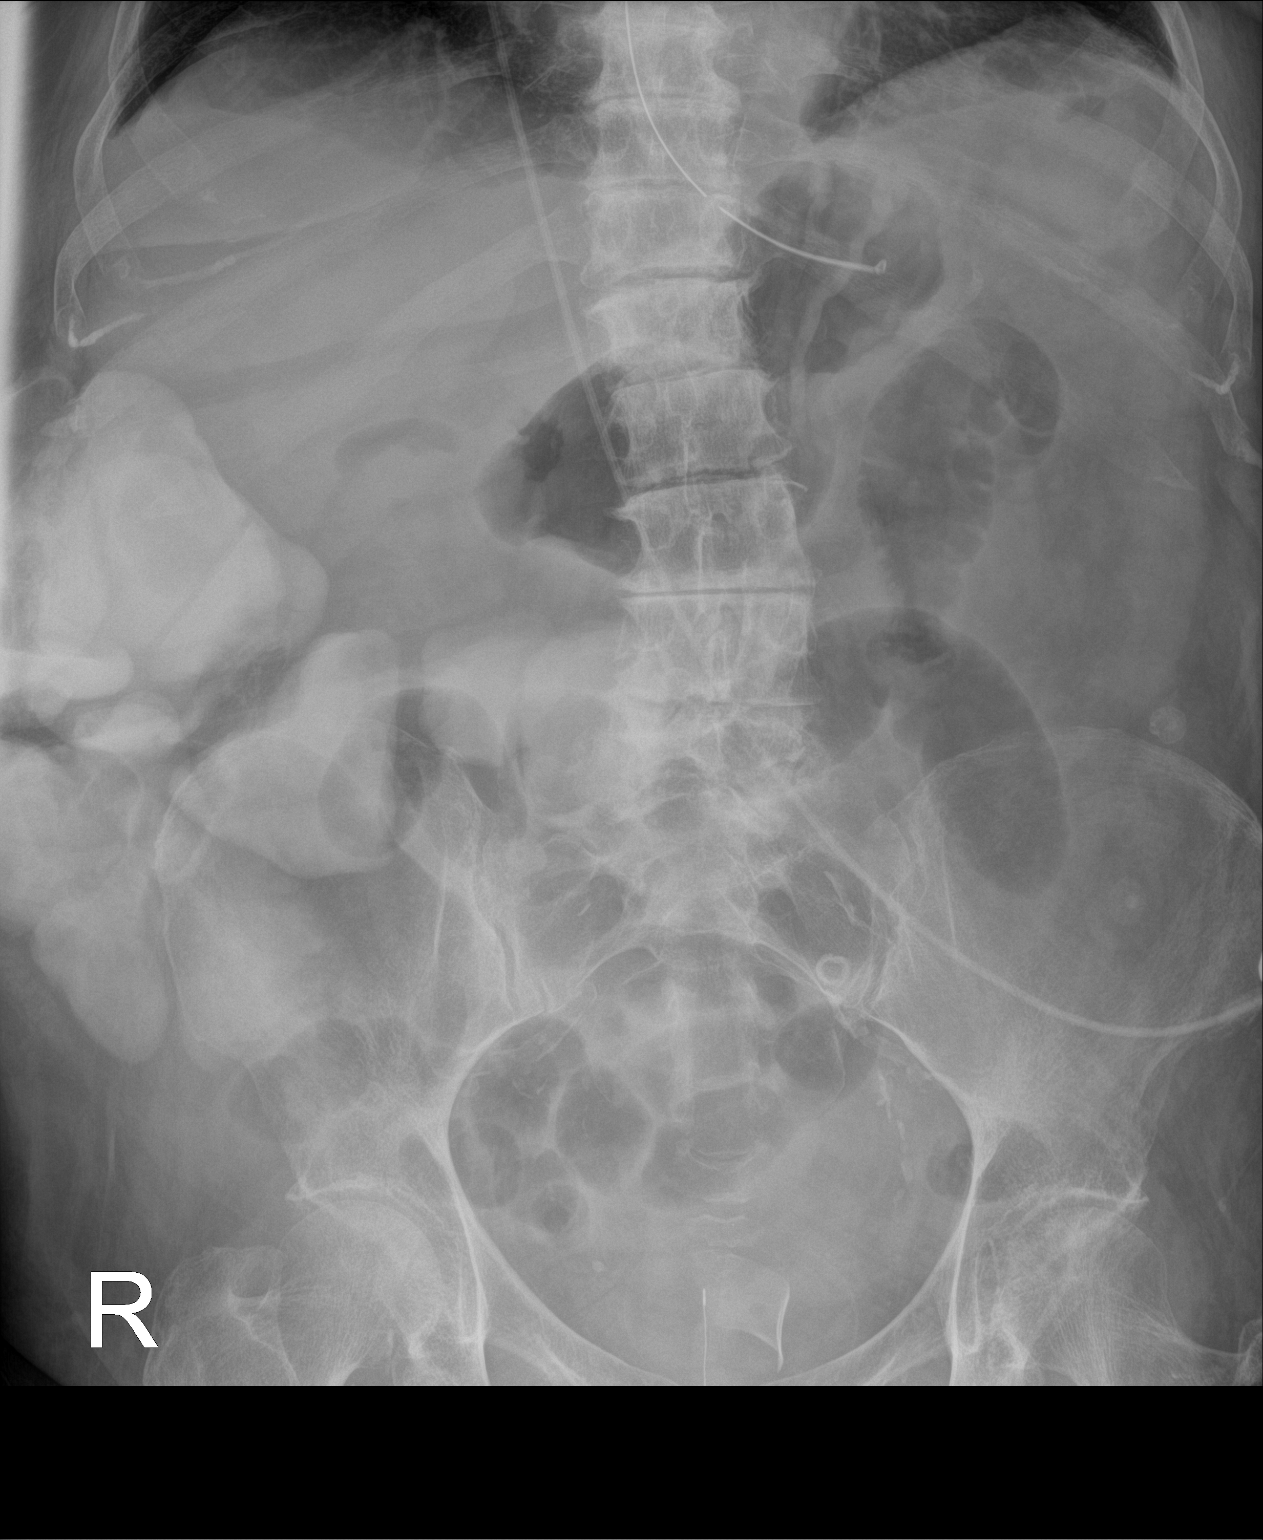

[1 of 1 positions shown; findings below may reference images not displayed]

FINDINGS: Nasogastric tube in the upper stomach region. There is oral contrast
in the right colon. Right colon is dilated but similar to the recent
CT. Few dilated loops of small bowel. Evidence for a rectal
catheter. Mild scoliosis in lumbar spine with multilevel disc
disease.
IMPRESSION: Nasogastric tube in the upper stomach region.

The oral contrast has advanced into the right colon. There continues
to be dilatation of the right colon. Dilatation is probably
secondary to the wall thickening in the left colon and colitis.
Findings could represent at least a partial colonic obstruction.
